# Patient Record
Sex: Female | Born: 1950
Health system: Southern US, Community
[De-identification: ages and names within clinical notes are randomized; demographics above are authoritative.]

## PROBLEM LIST (undated history)

## (undated) DIAGNOSIS — G243 Spasmodic torticollis: Secondary | ICD-10-CM

## (undated) DIAGNOSIS — R1310 Dysphagia, unspecified: Secondary | ICD-10-CM

## (undated) DIAGNOSIS — M7130 Other bursal cyst, unspecified site: Secondary | ICD-10-CM

## (undated) DIAGNOSIS — K5904 Chronic idiopathic constipation: Secondary | ICD-10-CM

## (undated) DIAGNOSIS — Z9889 Other specified postprocedural states: Secondary | ICD-10-CM

## (undated) DIAGNOSIS — G20 Parkinson's disease: Principal | ICD-10-CM

## (undated) DIAGNOSIS — R112 Nausea with vomiting, unspecified: Secondary | ICD-10-CM

## (undated) DIAGNOSIS — Z860100 Personal history of colon polyps, unspecified: Secondary | ICD-10-CM

## (undated) DIAGNOSIS — Z87898 Personal history of other specified conditions: Secondary | ICD-10-CM

## (undated) DIAGNOSIS — G4700 Insomnia, unspecified: Secondary | ICD-10-CM

## (undated) DIAGNOSIS — Z8669 Personal history of other diseases of the nervous system and sense organs: Secondary | ICD-10-CM

## (undated) DIAGNOSIS — G4752 REM sleep behavior disorder: Secondary | ICD-10-CM

## (undated) HISTORY — PX: FOOT SURGERY: SHX648

## (undated) HISTORY — DX: Personal history of other specified conditions: Z87.898

## (undated) HISTORY — DX: Personal history of colon polyps, unspecified: Z86.0100

## (undated) HISTORY — PX: TONSILLECTOMY: SUR1361

## (undated) HISTORY — DX: Chronic idiopathic constipation: K59.04

## (undated) HISTORY — DX: Other bursal cyst, unspecified site: M71.30

## (undated) HISTORY — DX: Personal history of other diseases of the nervous system and sense organs: Z86.69

## (undated) HISTORY — DX: Dysphagia, unspecified: R13.10

## (undated) HISTORY — PX: MOUTH SURGERY: SHX715

## (undated) HISTORY — DX: Spasmodic torticollis: G24.3

## (undated) HISTORY — PX: HAND TENDON SURGERY: SHX663

## (undated) HISTORY — DX: Insomnia, unspecified: G47.00

## (undated) HISTORY — DX: Parkinson's disease: G20

## (undated) HISTORY — DX: REM sleep behavior disorder: G47.52

---

## 1998-04-23 ENCOUNTER — Other Ambulatory Visit: Admission: RE | Admit: 1998-04-23 | Discharge: 1998-04-23 | Payer: Self-pay | Admitting: Gynecology

## 2000-06-10 ENCOUNTER — Other Ambulatory Visit: Admission: RE | Admit: 2000-06-10 | Discharge: 2000-06-10 | Payer: Self-pay | Admitting: Gynecology

## 2001-06-14 ENCOUNTER — Other Ambulatory Visit: Admission: RE | Admit: 2001-06-14 | Discharge: 2001-06-14 | Payer: Self-pay | Admitting: Gynecology

## 2002-06-29 ENCOUNTER — Other Ambulatory Visit: Admission: RE | Admit: 2002-06-29 | Discharge: 2002-06-29 | Payer: Self-pay | Admitting: Gynecology

## 2003-07-05 ENCOUNTER — Other Ambulatory Visit: Admission: RE | Admit: 2003-07-05 | Discharge: 2003-07-05 | Payer: Self-pay | Admitting: Gynecology

## 2004-07-08 ENCOUNTER — Other Ambulatory Visit: Admission: RE | Admit: 2004-07-08 | Discharge: 2004-07-08 | Payer: Self-pay | Admitting: Gynecology

## 2004-07-11 ENCOUNTER — Encounter: Admission: RE | Admit: 2004-07-11 | Discharge: 2004-07-11 | Payer: Self-pay | Admitting: Gynecology

## 2004-09-17 ENCOUNTER — Encounter: Admission: RE | Admit: 2004-09-17 | Discharge: 2004-09-17 | Payer: Self-pay | Admitting: Family Medicine

## 2004-09-18 ENCOUNTER — Ambulatory Visit: Payer: Self-pay | Admitting: Cardiology

## 2004-09-24 ENCOUNTER — Ambulatory Visit: Payer: Self-pay

## 2004-10-10 ENCOUNTER — Emergency Department (HOSPITAL_COMMUNITY): Admission: EM | Admit: 2004-10-10 | Discharge: 2004-10-10 | Payer: Self-pay | Admitting: Emergency Medicine

## 2005-01-19 ENCOUNTER — Encounter: Admission: RE | Admit: 2005-01-19 | Discharge: 2005-01-19 | Payer: Self-pay | Admitting: Neurology

## 2005-04-02 ENCOUNTER — Ambulatory Visit: Payer: Self-pay | Admitting: Internal Medicine

## 2005-04-14 ENCOUNTER — Ambulatory Visit: Payer: Self-pay | Admitting: Internal Medicine

## 2005-05-21 ENCOUNTER — Encounter: Admission: RE | Admit: 2005-05-21 | Discharge: 2005-05-21 | Payer: Self-pay | Admitting: General Surgery

## 2005-07-13 DIAGNOSIS — R569 Unspecified convulsions: Secondary | ICD-10-CM

## 2005-07-13 HISTORY — DX: Unspecified convulsions: R56.9

## 2005-07-20 ENCOUNTER — Other Ambulatory Visit: Admission: RE | Admit: 2005-07-20 | Discharge: 2005-07-20 | Payer: Self-pay | Admitting: Gynecology

## 2006-05-28 ENCOUNTER — Encounter: Admission: RE | Admit: 2006-05-28 | Discharge: 2006-05-28 | Payer: Self-pay | Admitting: Obstetrics and Gynecology

## 2006-07-22 ENCOUNTER — Other Ambulatory Visit: Admission: RE | Admit: 2006-07-22 | Discharge: 2006-07-22 | Payer: Self-pay | Admitting: Gynecology

## 2007-05-31 ENCOUNTER — Encounter: Admission: RE | Admit: 2007-05-31 | Discharge: 2007-05-31 | Payer: Self-pay | Admitting: Gynecology

## 2008-05-10 ENCOUNTER — Encounter (HOSPITAL_COMMUNITY): Payer: Self-pay | Admitting: Obstetrics and Gynecology

## 2008-05-10 ENCOUNTER — Ambulatory Visit (HOSPITAL_COMMUNITY): Admission: RE | Admit: 2008-05-10 | Discharge: 2008-05-10 | Payer: Self-pay | Admitting: Obstetrics and Gynecology

## 2008-09-25 ENCOUNTER — Encounter: Admission: RE | Admit: 2008-09-25 | Discharge: 2008-09-25 | Payer: Self-pay | Admitting: Obstetrics and Gynecology

## 2010-11-25 NOTE — Op Note (Signed)
NAME:  Chelsea, Conley NO.:  [id number]   MEDICAL RECORD NO.:  [id number]          PATIENT TYPE:  AMB   LOCATION:  SDC                           FACILITY:  [facility]   PHYSICIAN:  Zelphia Cairo, MD    DATE OF BIRTH:  Mar 18, 1951   DATE OF PROCEDURE:  05/10/2008  DATE OF DISCHARGE:                               OPERATIVE REPORT   PREOPERATIVE DIAGNOSIS:  Postmenopausal bleeding.   PROCEDURES:  1. Cervical block.  2. Hysteroscopy.  3. Dilation and curettage.   SURGEON:  Zelphia Cairo, MD   ESTIMATED BLOOD LOSS:  Minimal.   FLUID DEFICIT:  80 mL (sorbitol).   ANESTHESIA:  General.   SPECIMEN:  Endometrial curettings to pathology.   COMPLICATIONS:  None.   CONDITION:  Stable to recovery room.   PROCEDURE:  Chelsea Conley was taken to the recovery room where anesthesia was  found to be adequate.  She was placed in the dorsal lithotomy position  using Allen stirrups.  She was prepped and draped in sterile fashion and  an in-and-out catheter was used to drain her bladder for approximately  80 mL of clear urine.  Bivalve speculum was placed in the vagina and a  single-tooth tenaculum on the anterior lip of the cervix.  Yellow  plastic os finder was used to identify the cervical os.  The os was then  serially dilated using Pratt dilators.  Diagnostic hysteroscope was then  inserted into the uterine cavity and a survey was performed.  Bilateral  ostia appeared normal.  Endometrium appeared atrophic and pale small  polypoid mass was noted on the left anterior uterine wall.  Hysteroscope  was then removed.  A gentle curetting was then performed with a curette.  Specimen was placed on Telfa and passed off to be sent to pathology.  Hysteroscope was then reinserted.  Polypoid mass was noted to be  removed.  Hysteroscope, tenaculum, and speculum were then removed.  The  cervix was found to be hemostatic and the patient was taken to the  recovery room in stable  condition.     Zelphia Cairo, MD  Electronically Signed    GA/MEDQ  D:  05/10/2008  T:  05/11/2008  Job:  [id number]

## 2011-04-14 LAB — CBC
HCT: 42.3
Hemoglobin: 14.1
MCHC: 33.4
MCV: 91
Platelets: 255
RBC: 4.65
RDW: 12.7
WBC: 5

## 2011-04-14 LAB — TYPE AND SCREEN
ABO/RH(D): O POS
Antibody Screen: NEGATIVE

## 2011-04-14 LAB — ABO/RH: ABO/RH(D): O POS

## 2013-02-21 ENCOUNTER — Other Ambulatory Visit: Payer: Self-pay | Admitting: Gastroenterology

## 2013-02-21 DIAGNOSIS — R1011 Right upper quadrant pain: Secondary | ICD-10-CM

## 2013-02-27 ENCOUNTER — Ambulatory Visit (HOSPITAL_COMMUNITY)
Admission: RE | Admit: 2013-02-27 | Discharge: 2013-02-27 | Disposition: A | Discharge status: Home / Self Care | Payer: BC Managed Care – PPO | Source: Ambulatory Visit | Attending: Gastroenterology | Admitting: Gastroenterology

## 2013-02-27 ENCOUNTER — Encounter (HOSPITAL_COMMUNITY)
Admission: RE | Admit: 2013-02-27 | Discharge: 2013-02-27 | Disposition: A | Discharge status: Home / Self Care | Payer: BC Managed Care – PPO | Source: Ambulatory Visit | Attending: Gastroenterology | Admitting: Gastroenterology

## 2013-02-27 DIAGNOSIS — R1011 Right upper quadrant pain: Secondary | ICD-10-CM

## 2013-02-27 DIAGNOSIS — K7689 Other specified diseases of liver: Secondary | ICD-10-CM | POA: Insufficient documentation

## 2013-02-27 MED ORDER — SINCALIDE 5 MCG IJ SOLR
INTRAMUSCULAR | Status: AC
  Filled 2013-02-27: qty 5

## 2013-02-27 MED ORDER — SINCALIDE 5 MCG IJ SOLR
0.0200 ug/kg | Freq: Once | INTRAMUSCULAR | Status: AC
  Administered 2013-02-27: 1.42 ug via INTRAVENOUS

## 2013-02-28 ENCOUNTER — Other Ambulatory Visit: Payer: Self-pay | Admitting: Gastroenterology

## 2013-02-28 DIAGNOSIS — R1011 Right upper quadrant pain: Secondary | ICD-10-CM

## 2013-03-01 ENCOUNTER — Other Ambulatory Visit: Payer: Self-pay | Admitting: Gastroenterology

## 2013-03-01 DIAGNOSIS — R935 Abnormal findings on diagnostic imaging of other abdominal regions, including retroperitoneum: Secondary | ICD-10-CM

## 2013-03-02 ENCOUNTER — Other Ambulatory Visit: Payer: BC Managed Care – PPO

## 2013-03-09 ENCOUNTER — Ambulatory Visit
Admission: RE | Admit: 2013-03-09 | Discharge: 2013-03-09 | Disposition: A | Discharge status: Home / Self Care | Payer: BC Managed Care – PPO | Source: Ambulatory Visit | Attending: Gastroenterology | Admitting: Gastroenterology

## 2013-03-09 DIAGNOSIS — R935 Abnormal findings on diagnostic imaging of other abdominal regions, including retroperitoneum: Secondary | ICD-10-CM

## 2013-03-09 MED ORDER — GADOBENATE DIMEGLUMINE 529 MG/ML IV SOLN
14.0000 mL | Freq: Once | INTRAVENOUS | Status: AC | PRN
  Administered 2013-03-09: 14 mL via INTRAVENOUS

## 2013-08-01 ENCOUNTER — Telehealth: Payer: Self-pay | Admitting: *Deleted

## 2013-08-01 NOTE — Telephone Encounter (Signed)
Scheduled pt an appt 08-14-13

## 2013-08-01 NOTE — Telephone Encounter (Signed)
Pt states she has a question about an in office surgery, Dr Paulla Dolly had discussed.  I spoke with pt, informed that depending on the procedure, it may be able to be performed in the office.  I told her that she would need to be evaluated.  Pt agreed.  I referred to schedulers.

## 2013-08-14 ENCOUNTER — Ambulatory Visit (INDEPENDENT_AMBULATORY_CARE_PROVIDER_SITE_OTHER): Payer: BC Managed Care – PPO | Admitting: Podiatry

## 2013-08-14 ENCOUNTER — Encounter: Payer: Self-pay | Admitting: Podiatry

## 2013-08-14 VITALS — BP 135/80 | HR 91 | Resp 16

## 2013-08-14 DIAGNOSIS — M2040 Other hammer toe(s) (acquired), unspecified foot: Secondary | ICD-10-CM

## 2013-08-14 NOTE — Progress Notes (Signed)
Subjective:     Patient ID: Chelsea Conley, female   DOB: [birth year], 63 y.o.   MRN: [id number]  HPI patient presents stating I am ready to get this fifth toe fixed on my left foot. States that has been sore and she has trouble wearing shoe gear   Review of Systems     Objective:   Physical Exam Neurovascular status intact with significant keratotic lesion had a proximal phalanx fifth toe left that is painful when pressed and difficult for her to wear shoe gear with    Assessment:     Hammertoe deformity fifth toe left foot with keratotic lesion formation    Plan:     Reviewed x-rays and discussed condition. Discussed continued trimming versus fixing and she has opted to have it fixed I allowed her to read consent form for correction and explained to her all possible complications as outlined and she is willing to accept risk signing consent form after review. Scheduled for outpatient surgery at this time [facility] and explained total recovery can take up to 6 months

## 2013-08-14 NOTE — Patient Instructions (Signed)
Pre-Operative Instructions  Congratulations, you have decided to take an important step to improving your quality of life.  You can be assured that the doctors of [facility] will be with you every step of the way.  1. Plan to be at the surgery center/hospital at least 1 (one) hour prior to your scheduled time unless otherwise directed by the surgical center/hospital staff.  You must have a responsible adult accompany you, remain during the surgery and drive you home.  Make sure you have directions to the surgical center/hospital and know how to get there on time. 2. For hospital based surgery you will need to obtain a history and physical form from your family physician within 1 month prior to the date of surgery- we will give you a form for you primary physician.  3. We make every effort to accommodate the date you request for surgery.  There are however, times where surgery dates or times have to be moved.  We will contact you as soon as possible if a change in schedule is required.   4. No Aspirin/Ibuprofen for one week before surgery.  If you are on aspirin, any non-steroidal anti-inflammatory medications (Mobic, Aleve, Ibuprofen) you should stop taking it 7 days prior to your surgery.  You make take Tylenol  For pain prior to surgery.  5. Medications- If you are taking daily heart and blood pressure medications, seizure, reflux, allergy, asthma, anxiety, pain or diabetes medications, make sure the surgery center/hospital is aware before the day of surgery so they may notify you which medications to take or avoid the day of surgery. 6. No food or drink after midnight the night before surgery unless directed otherwise by surgical center/hospital staff. 7. No alcoholic beverages 24 hours prior to surgery.  No smoking 24 hours prior to or 24 hours after surgery. 8. Wear loose pants or shorts- loose enough to fit over bandages, boots, and casts. 9. No slip on shoes, sneakers are best. 10. Bring  your boot with you to the surgery center/hospital.  Also bring crutches or a walker if your physician has prescribed it for you.  If you do not have this equipment, it will be provided for you after surgery. 11. If you have not been contracted by the surgery center/hospital by the day before your surgery, call to confirm the date and time of your surgery. 12. Leave-time from work may vary depending on the type of surgery you have.  Appropriate arrangements should be made prior to surgery with your employer. 13. Prescriptions will be provided immediately following surgery by your doctor.  Have these filled as soon as possible after surgery and take the medication as directed. 14. Remove nail polish on the operative foot. 15. Wash the night before surgery.  The night before surgery wash the foot and leg well with the antibacterial soap provided and water paying special attention to beneath the toenails and in between the toes.  Rinse thoroughly with water and dry well with a towel.  Perform this wash unless told not to do so by your physician.  Enclosed: 1 Ice pack (please put in freezer the night before surgery)   1 Hibiclens skin cleaner   Pre-op Instructions  If you have any questions regarding the instructions, do not hesitate to call our office.  [place]: [address] [place], [place] [address] [phone]  [place]: [address] , [place] [address] [phone]  [place]: [address]  [place],  [address] [phone]  Dr. Richard   Tuchman DPM, Dr. Norman Regal DPM Dr. Richard Sikora DPM, Dr. M. Todd Hyatt DPM, Dr. Kathryn Egerton DPM 

## 2013-08-23 ENCOUNTER — Telehealth: Payer: Self-pay | Admitting: *Deleted

## 2013-08-23 NOTE — Telephone Encounter (Signed)
Pt states having surgery 08/29/2013, but the surgery center has not called with the time.  I informed the pt, [facility] will call about 24 - 48 hours before the surgery and give her the time.

## 2013-08-29 ENCOUNTER — Encounter: Payer: Self-pay | Admitting: Podiatry

## 2013-08-29 DIAGNOSIS — M2040 Other hammer toe(s) (acquired), unspecified foot: Secondary | ICD-10-CM

## 2013-09-04 ENCOUNTER — Ambulatory Visit (INDEPENDENT_AMBULATORY_CARE_PROVIDER_SITE_OTHER): Payer: BC Managed Care – PPO | Admitting: Podiatry

## 2013-09-04 ENCOUNTER — Ambulatory Visit (INDEPENDENT_AMBULATORY_CARE_PROVIDER_SITE_OTHER): Payer: BC Managed Care – PPO

## 2013-09-04 ENCOUNTER — Encounter: Payer: Self-pay | Admitting: Podiatry

## 2013-09-04 VITALS — BP 131/85 | HR 81 | Temp 97.3°F | Resp 18

## 2013-09-04 DIAGNOSIS — Z09 Encounter for follow-up examination after completed treatment for conditions other than malignant neoplasm: Secondary | ICD-10-CM

## 2013-09-04 NOTE — Progress Notes (Signed)
° °  Subjective:    Patient ID: Chelsea Conley, female    DOB: 1951-05-12, 63 y.o.   MRN: [id number]  HPI It has been good on my left foot and the surgery was 08-29-13 and not too much pain. She presents postop hammertoe repair fifth toe left foot on 08/29/2013, procedure perform a DR. Regal.    Review of Systems     Objective:   Physical Exam  Patient appears orientated x3 and is in no apparent pain.  The incisional site on the fifth toe is approximated with mild edema and some ecchymosis noted around the dorsal lateral aspect of the foot. There is no drainage from the incisional site. The toe appears rectus in appearance. There is no calf tenderness or edema on the left lower extremity.  X-ray taken today 02/23 2015, which did not populated into the reader list demonstrates resection of the proximal phalanx of the fifth left toe in a rectus position. There is a retained screw fixation noted in the left first metatarsal.  Radiographic impression: Evidence of surgical resection fifth toe left foot and satisfactory alignment.        Assessment & Plan:   Assessment: Satisfactory appearance of the operative site without any clinical sign of infection or DVT  Plan: Patient will be allowed to get wound site shower wet, and reapply light gauze dressing catching with Ace wrap. She will continue to ambulate in her surgical shoe.  Reappoint x2 weeks for suture removal.

## 2013-09-05 ENCOUNTER — Encounter: Payer: Self-pay | Admitting: Podiatry

## 2013-09-18 ENCOUNTER — Ambulatory Visit (INDEPENDENT_AMBULATORY_CARE_PROVIDER_SITE_OTHER): Payer: BC Managed Care – PPO | Admitting: Podiatry

## 2013-09-18 ENCOUNTER — Encounter: Payer: Self-pay | Admitting: Podiatry

## 2013-09-18 VITALS — BP 138/78 | HR 75 | Resp 16

## 2013-09-18 DIAGNOSIS — M2040 Other hammer toe(s) (acquired), unspecified foot: Secondary | ICD-10-CM

## 2013-09-18 NOTE — Progress Notes (Signed)
2.17.15 , post op suture removal . 5th toe left foot

## 2013-09-19 NOTE — Progress Notes (Signed)
Subjective:     Patient ID: Chelsea Conley, female   DOB: Dec 02, 1950, 63 y.o.   MRN: [id number]  HPI patient states my toe is healing fine and I am ready to get my stitches removed   Review of Systems     Objective:   Physical Exam Neurovascular status intact with fifth toe left doing well with mild edema stitches intact and wound edges well coapted with minimal discomfort    Assessment:     Healing well from digital surgery fifth left    Plan:     Stitches were removed today and dressing applied. Begin gradual shoe gear usage over the next several weeks

## 2013-09-19 NOTE — Progress Notes (Signed)
Surgery performed by Dr Paulla Dolly - Left Hammer Toe Repair with removal bone 5th toe

## 2013-10-25 ENCOUNTER — Telehealth: Payer: Self-pay | Admitting: *Deleted

## 2013-10-25 NOTE — Telephone Encounter (Signed)
I left a message on Monday.  Had foot surgery on 09/08/13.  I'm having different foot pain in the same area.  I want to speak to someone about that.  Please call.  I returned her call.  I just get pain on top of my foot and on the side on the area where I had the surgery.  Is this something I should be concerned about?  I asked her if she has any signs of infection.  She said no drainage, no excessive swelling or redness just the usual.  I informed her the pain is normal.  If it gets worse please call and schedule an appointment for follow-up.

## 2014-02-13 ENCOUNTER — Telehealth: Payer: Self-pay | Admitting: *Deleted

## 2014-02-13 NOTE — Telephone Encounter (Signed)
Called patient to see if she wanted an earlier appointment, patient stated that she would have to check with her job and call back to schedule appointment with NP MM.

## 2014-02-13 NOTE — Telephone Encounter (Signed)
Patient returned my phone call, appointment was scheduled for 02/16/14 at 2:30 pm with NP MM.

## 2014-02-15 ENCOUNTER — Encounter: Payer: Self-pay | Admitting: *Deleted

## 2014-02-16 ENCOUNTER — Encounter: Payer: Self-pay | Admitting: Adult Health

## 2014-02-16 ENCOUNTER — Ambulatory Visit (INDEPENDENT_AMBULATORY_CARE_PROVIDER_SITE_OTHER): Payer: Medicare Other | Admitting: Adult Health

## 2014-02-16 VITALS — BP 151/81 | HR 92 | Wt 155.0 lb

## 2014-02-16 DIAGNOSIS — R259 Unspecified abnormal involuntary movements: Secondary | ICD-10-CM

## 2014-02-16 DIAGNOSIS — R251 Tremor, unspecified: Secondary | ICD-10-CM

## 2014-02-16 MED ORDER — PRIMIDONE 50 MG PO TABS: 25.0000 mg | ORAL_TABLET | Freq: Every day | ORAL | Status: DC

## 2014-02-16 NOTE — Progress Notes (Signed)
PATIENT: Chelsea Conley DOB: 1951/05/25  REASON FOR VISIT: follow up HISTORY FROM: patient  HISTORY OF PRESENT ILLNESS: Chelsea Conley is a 63 year old female with a history of tremor. She returns today for followup. The patient has a tremor that was mainly effecting the left hand however at the last visit it was noted that she had a tremor in both hands. Patient reports that her tremor has gotten worse. She feels that it is more prominent in the left hand/arm. Patient denies any difficulty with ambulation but feels that she has to think more when she is walking. Denies any recent falls. No trouble with swallowing or choking. Denies trouble with standing or rolling over in bed. Patient has noticed more difficulty with eating certain foods due to the tremor. Patient states that when she was a child that she had a tremor in the left hand. She states that she never noticed but others made mention of it. Her pediatrician at the time told her she would grow out of it. Since her last visit patient has had no other medical issues.  REVIEW OF SYSTEMS: Full 14 system review of systems performed and notable only for:  Constitutional: N/A  Eyes: N/A Ear/Nose/Throat: N/A  Skin: N/A  Cardiovascular: N/A  Respiratory: N/A  Gastrointestinal: N/A  Genitourinary: N/A Hematology/Lymphatic: N/A  Endocrine: N/A Musculoskeletal:N/A  Allergy/Immunology: N/A  Neurological: tremors Psychiatric: N/A Sleep: N/A   ALLERGIES: No Known Allergies  HOME MEDICATIONS: Outpatient Prescriptions Prior to Visit  Medication Sig Dispense Refill  . Probiotic Product (PROBIOTIC DAILY PO) Take by mouth.      Marland Kitchen HYDROcodone-acetaminophen (NORCO/VICODIN) 5-325 MG per tablet Take 1 tablet by mouth every 6 (six) hours as needed for moderate pain.       No facility-administered medications prior to visit.    PAST MEDICAL HISTORY: Past Medical History  Diagnosis Date  . History of tremor   . History of seizures      PAST SURGICAL HISTORY: Past Surgical History  Procedure Laterality Date  . Foot surgery Bilateral   . Tonsillectomy    . Mouth surgery      X2    FAMILY HISTORY: Family History  Problem Relation Age of Onset  . Diabetes Father   . Diabetes Sister   . Diabetes Brother   . Parkinson's disease Father   . Parkinson's disease Maternal Grandfather   . Parkinson's disease Mother     SOCIAL HISTORY: History   Social History  . Marital Status: Married    Spouse Name: N/A    Number of Children: 4  . Years of Education: 12   Occupational History  .      [employer]   Social History Main Topics  . Smoking status: Never Smoker   . Smokeless tobacco: Never Used  . Alcohol Use: Yes  . Drug Use: No  . Sexual Activity: Not on file   Other Topics Concern  . Not on file   Social History Narrative   Patient is married with 4 children.   Patient is left handed.   Patient has a high school education.            PHYSICAL EXAM  Filed Vitals:   02/16/14 1418  BP: 151/81  Pulse: 92  Weight: 155 lb (70.308 kg)   There is no height on file to calculate BMI.   Generalized: Well developed, in no acute distress   Neurological examination  Mentation: Alert oriented to time, place, history taking.  Follows all commands speech and language fluent Cranial nerve II-XII: Pupils were equal round reactive to light. Extraocular movements were full, visual field were full on confrontational test. Facial sensation and strength were normal. . Uvula tongue midline. Head turning and shoulder shrug  were normal and symmetric. Motor: The motor testing reveals 5 over 5 strength of all 4 extremities. Good symmetric motor tone is noted throughout. Resting tremor tremor noted in upper extremities left>right. Sensory: Sensory testing is intact to soft touch on all 4 extremities. No evidence of extinction is noted.  Coordination: Cerebellar testing reveals good finger-nose-finger and  heel-to-shin bilaterally. Intention tremor noted Gait and station: Patient is able to stand from a sitting position without assistance. Gait is normal. Good arm swing however tremor noted in left arm while walking. Good turns.  Reflexes: Deep tendon reflexes are symmetric and normal bilaterally.   DIAGNOSTIC DATA (LABS, IMAGING, TESTING) - I reviewed patient records, labs, notes, testing and imaging myself where available.  Lab Results  Component Value Date   WBC 5.0 05/10/2008   HGB 14.1 05/10/2008   HCT 42.3 05/10/2008   MCV 91.0 05/10/2008   PLT 255 05/10/2008     ASSESSMENT AND PLAN 63 y.o. year old female  has a past medical history of History of tremor and History of seizures. here with:  1. Tremor- Parkinson vs. Essential   Patient tremor has increased since we saw her in 2013. She presents with symptoms that would reflect a benign essential tremor as well as some features of parkinson's. She also has a family history of parkinson's disease. According to the patient the tremor in the left hand has been present since she was a child. It has progressively gotten more noticeable as she has aged. I will start primidone 25 mg at bedtime. We can increase this dose if needed. Patient should follow-up in 6 months or sooner if needed.    Ward Givens, MSN, NP-C 02/16/2014, 2:44 PM [facility] [address], [place], [place] [address] [phone]  Note: This document was prepared with digital dictation and possible smart phrase technology. Any transcriptional errors that result from this process are unintentional.

## 2014-02-16 NOTE — Patient Instructions (Signed)
Primidone tablets What is this medicine? PRIMIDONE (PRI mi done) is a barbiturate. This medicine is used to control seizures in certain types of epilepsy. It is not for use in absence (petit mal) seizures. This medicine may be used for other purposes; ask your health care provider or pharmacist if you have questions. COMMON BRAND NAME(S): Mysoline What should I tell my health care provider before I take this medicine? They need to know if you have any of these conditions: -kidney disease -liver disease -porphyria -suicidal thoughts, plans, or attempt; a previous suicide attempt by you or a family member -an unusual or allergic reaction to primidone, phenobarbital, other barbiturates or seizure medications, other medicines, foods, dyes, or preservatives -pregnant or trying to get pregnant -breast-feeding How should I use this medicine? Take this medicine by mouth with a glass of water. Follow the directions on the prescription label. Take your doses at regular intervals. Do not take your medicine more often than directed. Do not stop taking except on the advice of your doctor or health care professional. A special MedGuide will be given to you by the pharmacist with each prescription and refill. Be sure to read this information carefully each time. Contact your pediatrician or health care professional regarding the use of this medication in children. Special care may be needed. While this drug may be prescribed for children for selected conditions, precautions do apply. Overdosage: If you think you have taken too much of this medicine contact a poison control center or emergency room at once. NOTE: This medicine is only for you. Do not share this medicine with others. What if I miss a dose? If you miss a dose, take it as soon as you can. If it is almost time for your next dose, take only that dose. Do not take double or extra doses. What may interact with this medicine? Do not take this  medicine with any of the following medications: -voriconazole This medicine may also interact with the following medications: -cancer-treating medications -cyclosporine -disopyramide -doxycycline -female hormones, including contraceptive or birth control pills -medicines for mental depression, anxiety or other mood problems -medicines for treating HIV infection or AIDS -modafinil -prescription pain medications -quinidine -warfarin This list may not describe all possible interactions. Give your health care provider a list of all the medicines, herbs, non-prescription drugs, or dietary supplements you use. Also tell them if you smoke, drink alcohol, or use illegal drugs. Some items may interact with your medicine. What should I watch for while using this medicine? Visit your doctor or health care professional for regular checks on your progress. It may be 2 to 3 weeks before you see the full effects of this medicine. Do not suddenly stop taking this medicine, you may increase the risk of seizures. Your doctor or health care professional may want to gradually reduce the dose. Wear a medical identification bracelet or chain to say you have epilepsy, and carry a card that lists all your medications. You may get drowsy or dizzy. Do not drive, use machinery, or do anything that needs mental alertness until you know how this medicine affects you. Do not stand or sit up quickly, especially if you are an older patient. This reduces the risk of dizzy or fainting spells. Alcohol may interfere with the effect of this medicine. Avoid alcoholic drinks. Birth control pills may not work properly while you are taking this medicine. Talk to your doctor about using an extra method of birth control. The use of this medicine  may increase the chance of suicidal thoughts or actions. Pay special attention to how you are responding while on this medicine. Any worsening of mood, or thoughts of suicide or dying should be  reported to your health care professional right away. Women who become pregnant while using this medicine may enroll in the [place] American Antiepileptic Drug Pregnancy Registry by calling 1-888-233-2334. This registry collects information about the safety of antiepileptic drug use during pregnancy. What side effects may I notice from receiving this medicine? Side effects that you should report to your doctor or health care professional as soon as possible: -allergic reactions like skin rash, itching or hives, swelling of the face, lips, or tongue -blurred, double vision, or uncontrollable rolling or movements of the eyes -redness, blistering, peeling or loosening of the skin, including inside the mouth -shortness of breath or difficulty breathing -unusual excitement or restlessness, more likely in children and the elderly -unusually weak or tired -worsening of mood, thoughts or actions of suicide or dying Side effects that usually do not require medical attention (report to your doctor or health care professional if they continue or are bothersome): -clumsiness, unsteadiness, or a hang-over effect -decreased sexual ability -dizziness, drowsiness -loss of appetite -nausea or vomiting This list may not describe all possible side effects. Call your doctor for medical advice about side effects. You may report side effects to FDA at 1-800-FDA-1088. Where should I keep my medicine? Keep out of the reach of children. Store at room temperature between 15 and 30 degrees C (59 and 86 degrees F). Throw away any unused medicine after the expiration date. NOTE: This sheet is a summary. It may not cover all possible information. If you have questions about this medicine, talk to your doctor, pharmacist, or health care provider.  2015, Elsevier/Gold Standard. (2010-02-25 18:04:48)  

## 2014-03-26 ENCOUNTER — Telehealth: Payer: Self-pay | Admitting: Adult Health

## 2014-03-26 MED ORDER — PRIMIDONE 50 MG PO TABS: 50.0000 mg | ORAL_TABLET | Freq: Every day | ORAL | Status: DC

## 2014-03-26 NOTE — Telephone Encounter (Signed)
I called the patient. She is currently taking primidone 50 mg one half a tablet at night. She states it is used to work very well but lately her tremor has gotten worse. She is requesting to take the whole tablet at night. I am amenable to this. She can begin taking primidone 50 mg one tablet at night.

## 2014-03-26 NOTE — Telephone Encounter (Signed)
Spoke with patient states that when she first started on the medication that it was working very well, but now not so well, states that she only takes 1/2 pill, should she increase to a whole pill?

## 2014-03-26 NOTE — Telephone Encounter (Signed)
Patient's calling to stated she's taking 1/2 dose of primidone (MYSOLINE) 50 MG tablet.  Patient stated it was helping at first really well for tremors, but not as effective as it was.  Questioning if she take whole pill instead?  Please call anytime and may leave detailed message on voicemail.

## 2014-05-01 ENCOUNTER — Ambulatory Visit: Payer: BC Managed Care – PPO | Admitting: Neurology

## 2014-05-30 ENCOUNTER — Encounter: Payer: Self-pay | Admitting: Neurology

## 2014-06-05 ENCOUNTER — Encounter: Payer: Self-pay | Admitting: Neurology

## 2014-06-22 ENCOUNTER — Other Ambulatory Visit: Payer: Self-pay

## 2014-06-22 MED ORDER — PRIMIDONE 50 MG PO TABS: 50.0000 mg | ORAL_TABLET | Freq: Every day | ORAL | Status: DC

## 2014-06-22 NOTE — Telephone Encounter (Signed)
Per phone note from 09/14

## 2014-08-24 ENCOUNTER — Ambulatory Visit (INDEPENDENT_AMBULATORY_CARE_PROVIDER_SITE_OTHER): Payer: BLUE CROSS/BLUE SHIELD | Admitting: Adult Health

## 2014-08-24 ENCOUNTER — Encounter: Payer: Self-pay | Admitting: Adult Health

## 2014-08-24 VITALS — BP 143/87 | HR 81 | Ht 66.0 in | Wt 158.0 lb

## 2014-08-24 DIAGNOSIS — R251 Tremor, unspecified: Secondary | ICD-10-CM | POA: Diagnosis not present

## 2014-08-24 MED ORDER — PROPRANOLOL HCL 10 MG PO TABS: 10.0000 mg | ORAL_TABLET | Freq: Two times a day (BID) | ORAL | Status: DC

## 2014-08-24 NOTE — Patient Instructions (Signed)
Stop primidone. Start propranolol 10 mg twice a day. If tolerating this dose after two weeks we will increase to 2 tablet BID. Please call our office in two weeks.   Propranolol tablets What is this medicine? PROPRANOLOL (proe PRAN oh lole) is a beta-blocker. Beta-blockers reduce the workload on the heart and help it to beat more regularly. This medicine is used to treat high blood pressure, to control irregular heart rhythms (arrhythmias) and to relieve chest pain caused by angina. It may also be helpful after a heart attack. This medicine is also used to prevent migraine headaches, relieve uncontrollable shaking (tremors), and help certain problems related to the thyroid gland and adrenal gland. This medicine may be used for other purposes; ask your health care provider or pharmacist if you have questions. COMMON BRAND NAME(S): Inderal What should I tell my health care provider before I take this medicine? They need to know if you have any of these conditions: -circulation problems or blood vessel disease -diabetes -history of heart attack or heart disease, vasospastic angina -kidney disease -liver disease -lung or breathing disease, like asthma or emphysema -pheochromocytoma -slow heart rate -thyroid disease -an unusual or allergic reaction to propranolol, other beta-blockers, medicines, foods, dyes, or preservatives -pregnant or trying to get pregnant -breast-feeding How should I use this medicine? Take this medicine by mouth with a glass of water. Follow the directions on the prescription label. Take your doses at regular intervals. Do not take your medicine more often than directed. Do not stop taking except on your the advice of your doctor or health care professional. Talk to your pediatrician regarding the use of this medicine in children. Special care may be needed. Overdosage: If you think you have taken too much of this medicine contact a poison control center or emergency room  at once. NOTE: This medicine is only for you. Do not share this medicine with others. What if I miss a dose? If you miss a dose, take it as soon as you can. If it is almost time for your next dose, take only that dose. Do not take double or extra doses. What may interact with this medicine? Do not take this medicine with any of the following medications: -feverfew -phenothiazines like chlorpromazine, mesoridazine, prochlorperazine, thioridazine This medicine may also interact with the following medications: -aluminum hydroxide gel -antipyrine -antiviral medicines for HIV or AIDS -barbiturates like phenobarbital -certain medicines for blood pressure, heart disease, irregular heart beat -cimetidine -ciprofloxacin -diazepam -fluconazole -haloperidol -isoniazid -medicines for cholesterol like cholestyramine or colestipol -medicines for mental depression -medicines for migraine headache like almotriptan, eletriptan, frovatriptan, naratriptan, rizatriptan, sumatriptan, zolmitriptan -NSAIDs, medicines for pain and inflammation, like ibuprofen or naproxen -phenytoin -rifampin -teniposide -theophylline -thyroid medicines -tolbutamide -warfarin -zileuton This list may not describe all possible interactions. Give your health care provider a list of all the medicines, herbs, non-prescription drugs, or dietary supplements you use. Also tell them if you smoke, drink alcohol, or use illegal drugs. Some items may interact with your medicine. What should I watch for while using this medicine? Visit your doctor or health care professional for regular check ups. Check your blood pressure and pulse rate regularly. Ask your health care professional what your blood pressure and pulse rate should be, and when you should contact them. You may get drowsy or dizzy. Do not drive, use machinery, or do anything that needs mental alertness until you know how this drug affects you. Do not stand or sit up  quickly, especially if you  are an older patient. This reduces the risk of dizzy or fainting spells. Alcohol can make you more drowsy and dizzy. Avoid alcoholic drinks. This medicine can affect blood sugar levels. If you have diabetes, check with your doctor or health care professional before you change your diet or the dose of your diabetic medicine. Do not treat yourself for coughs, colds, or pain while you are taking this medicine without asking your doctor or health care professional for advice. Some ingredients may increase your blood pressure. What side effects may I notice from receiving this medicine? Side effects that you should report to your doctor or health care professional as soon as possible: -allergic reactions like skin rash, itching or hives, swelling of the face, lips, or tongue -breathing problems -changes in blood sugar -cold hands or feet -difficulty sleeping, nightmares -dry peeling skin -hallucinations -muscle cramps or weakness -slow heart rate -swelling of the legs and ankles -vomiting Side effects that usually do not require medical attention (report to your doctor or health care professional if they continue or are bothersome): -change in sex drive or performance -diarrhea -dry sore eyes -hair loss -nausea -weak or tired This list may not describe all possible side effects. Call your doctor for medical advice about side effects. You may report side effects to FDA at 1-800-FDA-1088. Where should I keep my medicine? Keep out of the reach of children. Store at room temperature between 15 and 30 degrees C (59 and 86 degrees F). Protect from light. Throw away any unused medicine after the expiration date. NOTE: This sheet is a summary. It may not cover all possible information. If you have questions about this medicine, talk to your doctor, pharmacist, or health care provider.  2015, [employer]vier/[employer] Standard. (2013-03-03 14:51:53)

## 2014-08-24 NOTE — Progress Notes (Signed)
Chelsea Conley: Pricilla Holm Smalls DOB: 1951/01/29  REASON FOR VISIT: follow up- tremor HISTORY FROM: Chelsea Conley  HISTORY OF PRESENT ILLNESS: Chelsea Conley is a 64 year old female with a history of tremor. She returns today for followup. She is currently taking primidone she states that it worked well initially but it has worn off. She also reports that her balance has been affected since we increased the dose of primidone. She states that when she begins to ambulate her feet when shuffling but then she has good stride. Her tremor occurs sometimes at rest and with doing activities. It is greater in the left hand than right. Denies drooling. No trouble with swallowing. She states every once in a while something will get stuck in her throat. No trouble with standing and denies trouble rolling over in bed. No new medical issues since last seen.   HISTORY 03/26/14: Ms. Lenis is a 64 year old female with a history of tremor. She returns today for followup. The Chelsea Conley has a tremor that was mainly effecting the left hand however at the last visit it was noted that she had a tremor in both hands. Chelsea Conley reports that her tremor has gotten worse. She feels that it is more prominent in the left hand/arm. Chelsea Conley denies any difficulty with ambulation but feels that she has to think more when she is walking. Denies any recent falls. No trouble with swallowing or choking. Denies trouble with standing or rolling over in bed. Chelsea Conley has noticed more difficulty with eating certain foods due to the tremor. Chelsea Conley states that when she was a child that she had a tremor in the left hand. She states that she never noticed but others made mention of it. Her pediatrician at the time told her she would grow out of it. Since her last visit Chelsea Conley has had no other medical issues.  REVIEW OF SYSTEMS: Out of a complete 14 system review of symptoms, the Chelsea Conley complains only of the following symptoms, and all other reviewed systems  are negative.  tremors  ALLERGIES: No Known Allergies  HOME MEDICATIONS: Outpatient Prescriptions Prior to Visit  Medication Sig Dispense Refill  . primidone (MYSOLINE) 50 MG tablet Take 1 tablet (50 mg total) by mouth at bedtime. 30 tablet 3  . Probiotic Product (PROBIOTIC DAILY PO) Take by mouth.     No facility-administered medications prior to visit.    PAST MEDICAL HISTORY: Past Medical History  Diagnosis Date  . History of tremor   . History of seizures     PAST SURGICAL HISTORY: Past Surgical History  Procedure Laterality Date  . Foot surgery Bilateral   . Tonsillectomy    . Mouth surgery      X2    FAMILY HISTORY: Family History  Problem Relation Age of Onset  . Diabetes Father   . Diabetes Sister   . Diabetes Brother   . Parkinson's disease Father   . Parkinson's disease Maternal Grandfather   . Parkinson's disease Mother    PHYSICAL EXAM  Filed Vitals:   08/24/14 1041  BP: 143/87  Pulse: 81  Height: 5\' 6"  (1.676 m)  Weight: 158 lb (71.668 kg)   Body mass index is 25.51 kg/(m^2).  Generalized: Well developed, in no acute distress   Neurological examination  Mentation: Alert oriented to time, place, history taking. Follows all commands speech and language fluent. No masking of the face noted. Cranial nerve II-XII: Pupils were equal round reactive to light. Extraocular movements were full, visual field were full  on confrontational test. Facial sensation and strength were normal. Uvula tongue midline. Head turning and shoulder shrug  were normal and symmetric. Motor: The motor testing reveals 5 over 5 strength of all 4 extremities. Good symmetric motor tone is noted throughout. Resting tremor noted in left hand as well as intention tremor noted in left hand and right hand. Left >right. Sensory: Sensory testing is intact to soft touch on all 4 extremities. No evidence of extinction is noted.  Coordination: Cerebellar testing reveals good  finger-nose-finger and heel-to-shin bilaterally.  Gait and station:Chelsea Conley is able to stand from a sitting position with out assistance. Occasionally when she starts to ambulate or goes to make a change in direction she will have a shuffling gait but then quickly develops a good stride. She has decreased arm swing on the right. Tandem gait is normal. Romberg is negative. No drift is seen.  Reflexes: Deep tendon reflexes are symmetric and normal bilaterally.   DIAGNOSTIC DATA (LABS, IMAGING, TESTING) - I reviewed Chelsea Conley records, labs, notes, testing and imaging myself where available.   ASSESSMENT AND PLAN 63 y.o. year old female  has a past medical history of History of tremor and History of seizures. here with;  1. Tremor  Primidone was working well for the Chelsea Conley but she has developed changes in her gait that she states occurred after increasing the primidone. I will stop Primidone and start propranolol 10 mg BID. If tolerating we will increase to 20 mg BID. Chelsea Conley does have some overlying symptoms of parkinson's. For now we will continue to monitor. She will F/U in 3 months or sooner if needed.   Ward Givens, MSN, NP-C 08/24/2014, 10:49 AM [facility] [address], [place], [place] [address] [phone]  Note: This document was prepared with digital dictation and possible smart phrase technology. Any transcriptional errors that result from this process are unintentional.

## 2014-08-24 NOTE — Progress Notes (Signed)
I have read the note, and I agree with the clinical assessment and plan.  Kenzel Ruesch KEITH   

## 2014-09-07 ENCOUNTER — Telehealth: Payer: Self-pay | Admitting: Adult Health

## 2014-09-07 NOTE — Telephone Encounter (Signed)
Please advise the patient.

## 2014-09-07 NOTE — Telephone Encounter (Signed)
I called the patient. She states that since she started propranolol she feels that her tremors have gotten worse. She states the first couple nights she had a hard time sleeping because her left hand tremor was bad.  the patient is acutally on a very low dose of the propranolol. We should increase this and see if it offers any benefit. She will begin taken 20 mg twice a day. I have again went over the side effects of propranolol with the patient. She verbalized understanding.

## 2014-09-07 NOTE — Telephone Encounter (Signed)
Patient stated Rx propranolol (INDERAL) 10 MG tablet, wasn't beneficial for tremors in left hand.  She stated she experienced tremors in L hand, arm and also R hand.  Gait has also worsen.  Please call and advise.

## 2014-09-13 ENCOUNTER — Telehealth: Payer: Self-pay | Admitting: Adult Health

## 2014-09-13 MED ORDER — PROPRANOLOL HCL 10 MG PO TABS: 20.0000 mg | ORAL_TABLET | Freq: Two times a day (BID) | ORAL | Status: DC

## 2014-09-13 NOTE — Telephone Encounter (Signed)
Pt is calling stating she needs a new Rx written for propranolol (INDERAL) 10 MG tablet.  She states that it's working and Jinny Blossom was going to double the dose.  She uses [facility] in [place].  Please advise.

## 2014-09-13 NOTE — Telephone Encounter (Signed)
Per phone note on 02/26: She will begin taken 20 mg twice a day. I have again went over the side effects of propranolol with the patient. She verbalized understanding.  Rx has been sent.

## 2014-11-30 ENCOUNTER — Other Ambulatory Visit: Payer: Self-pay | Admitting: Obstetrics and Gynecology

## 2014-12-03 LAB — CYTOLOGY - PAP

## 2015-01-05 ENCOUNTER — Other Ambulatory Visit: Payer: Self-pay

## 2015-01-05 MED ORDER — PROPRANOLOL HCL 10 MG PO TABS: 20.0000 mg | ORAL_TABLET | Freq: Two times a day (BID) | ORAL | Status: DC

## 2015-01-08 ENCOUNTER — Ambulatory Visit (INDEPENDENT_AMBULATORY_CARE_PROVIDER_SITE_OTHER): Payer: Medicare Other | Admitting: Adult Health

## 2015-01-08 ENCOUNTER — Encounter: Payer: Self-pay | Admitting: Adult Health

## 2015-01-08 ENCOUNTER — Ambulatory Visit: Payer: BLUE CROSS/BLUE SHIELD | Admitting: Adult Health

## 2015-01-08 VITALS — BP 132/72 | HR 65 | Ht 65.0 in | Wt 153.0 lb

## 2015-01-08 DIAGNOSIS — R259 Unspecified abnormal involuntary movements: Secondary | ICD-10-CM

## 2015-01-08 DIAGNOSIS — R251 Tremor, unspecified: Secondary | ICD-10-CM

## 2015-01-08 MED ORDER — CARBIDOPA-LEVODOPA 25-100 MG PO TABS: ORAL_TABLET | ORAL | Status: DC

## 2015-01-08 NOTE — Patient Instructions (Signed)
Take half a pill twice daily, at approximately 8 AM and 1 PM for one week, then half a pill 3 times a day, at 8 AM, 1 PM and 6 PM for one week, then one pill 3 times a day thereafter.   Carbidopa; Levodopa tablets What is this medicine? CARBIDOPA;LEVODOPA (kar bi DOE pa; lee voe DOE pa) is used to treat the symptoms of Parkinson's disease. This medicine may be used for other purposes; ask your health care provider or pharmacist if you have questions. COMMON BRAND NAME(S): Atamet, SINEMET What should I tell my health care provider before I take this medicine? They need to know if you have any of these conditions: -asthma or lung disease -depression or other mental illness -diabetes -glaucoma -heart disease, including history of a heart attack -irregular heart beat -kidney or liver disease -melanoma or suspicious skin lesions -stomach or intestine ulcers -an unusual or allergic reaction to levodopa, carbidopa, other medicines, foods, dyes, or preservatives -pregnant or trying to get pregnant -breast-feeding How should I use this medicine? Take this medicine by mouth with a glass of water. Follow the directions on the prescription label. Take your doses at regular intervals. Do not take your medicine more often than directed. Do not stop taking except on the advice of your doctor or health care professional. Talk to your pediatrician regarding the use of this medicine in children. Special care may be needed. Overdosage: If you think you have taken too much of this medicine contact a poison control center or emergency room at once. NOTE: This medicine is only for you. Do not share this medicine with others. What if I miss a dose? If you miss a dose, take it as soon as you can. If it is almost time for your next dose, take only that dose. Do not take double or extra doses. What may interact with this medicine? Do not take this medicine with any of the following medications: -MAOIs like  Marplan, Nardil, and Parnate -reserpine -tetrabenazine This medicine may also interact with the following medications: -alcohol -droperidol -entacapone -iron supplements or multivitamins with iron -isoniazid, INH -linezolid -medicines for depression, anxiety, or psychotic disturbances -medicines for high blood pressure -medicines for sleep -metoclopramide -papaverine -procarbazine -tedizolid -rasagiline -selegiline -tolcapone This list may not describe all possible interactions. Give your health care provider a list of all the medicines, herbs, non-prescription drugs, or dietary supplements you use. Also tell them if you smoke, drink alcohol, or use illegal drugs. Some items may interact with your medicine. What should I watch for while using this medicine? Visit your doctor or health care professional for regular checks on your progress. It may be several weeks or months before you feel the full benefits of this medicine. Continue to take your medicine on a regular schedule. Do not take any additional medicines for Parkinson's disease without first consulting with your health care provider. You may experience a wearing of effect prior to the time for your next dose of this medicine. You may also experience an on-off effect where the medicine apparently stops working for anything from a minute to several hours, then suddenly starts working again. Tell your doctor or health care professional if any of these symptoms happen to you. Your dose may need to be changed. A high protein diet can slow or prevent absorption of this medicine. Avoid high protein foods near the time of taking this medicine to help to prevent these problems. Take this medicine at least 30 minutes before  eating or one hour after meals. You may want to eat higher protein foods later in the day or in small amounts. Discuss your diet with your doctor or health care professional or nutritionist. You may get drowsy or dizzy. Do  not drive, use machinery, or do anything that needs mental alertness until you know how this drug affects you. Do not stand or sit up quickly, especially if you are an older patient. This reduces the risk of dizzy or fainting spells. Alcohol can make you more drowsy and dizzy. Avoid alcoholic drinks. If you find that you have sudden feelings of wanting to sleep during normal activities, like cooking, watching television, or while driving or riding in a car, you should contact your health care professional. If you are diabetic, this medicine may interfere with the accuracy of some tests for sugar or ketones in the urine (does not interfere with blood tests). Check with your doctor or health care professional before changing the dose of your diabetic medicine. This medicine may discolor the urine or sweat, making it look darker or red in color. This is of no cause for concern. However, this may stain clothing or fabrics. There have been reports of increased sexual urges or other strong urges such as gambling while taking some medicines for Parkinson's disease. If you experience any of these urges while taking this medicine, you should report it to your health care provider as soon as possible. You should check your skin often for changes to moles and new growths while taking this medicine. Call your doctor if you notice any of these changes. What side effects may I notice from receiving this medicine? Side effects that you should report to your doctor or health care professional as soon as possible: -allergic reactions like skin rash, itching or hives, swelling of the face, lips, or tongue -anxiety, confusion, or nervousness -falling asleep during normal activities like driving -fast, irregular heartbeat -hallucination, loss of contact with reality -mood changes like aggressive behavior, depression -stomach pain -trouble passing urine -uncontrolled movements of the mouth, head, hands, feet, shoulders,  eyelids or other unusual muscle movements Side effects that usually do not require medical attention (report to your doctor or health care professional if they continue or are bothersome): -headache -loss of appetite -muscle twitches -nausea, vomiting -nightmares, trouble sleeping -unusually weak or tired This list may not describe all possible side effects. Call your doctor for medical advice about side effects. You may report side effects to FDA at 1-800-FDA-1088. Where should I keep my medicine? Keep out of the reach of children. Store at room temperature between 15 and 30 degrees C (59 and 86 degrees F). Protect from light. Throw away any unused medicine after the expiration date. NOTE: This sheet is a summary. It may not cover all possible information. If you have questions about this medicine, talk to your doctor, pharmacist, or health care provider.  2015, [employer]vier/Gold Standard. (2013-08-29 15:41:53)

## 2015-01-08 NOTE — Progress Notes (Addendum)
PATIENT: Chelsea Conley DOB: 10-Oct-1950  REASON FOR VISIT: follow up- tremor HISTORY FROM: patient  HISTORY OF PRESENT ILLNESS: Chelsea Conley  Is a 64 year old female with a history of tremor. She returns today for follow-up. She is currently taking propranolol 20 mg twice a day. She reports that her tremor had  Improved initially but she also has developed new symptoms. Her husband states that her gait has worsened. He states that when she first begins to walk she shuffles her feet. Patient reports that at times she feels like she is telling her feet to move but they don't. Husband has also noticed that she does not have good arm swing. She denies any trouble with swallowing. Denies any trouble with her speech. She reports that her tremor occurs at rest.  Left-sided greater than the right. She has a mother and maternal grandfather that had Parkinson's as well. She is able to complete all activities of daily living. She returns today for evaluation.   HISTORY 08/24/14: Chelsea Conley is a 64 year old female with a history of tremor. She returns today for followup. She is currently taking primidone she states that it worked well initially but it has worn off. She also reports that her balance has been affected since we increased the dose of primidone. She states that when she begins to ambulate her feet when shuffling but then she has good stride. Her tremor occurs sometimes at rest and with doing activities. It is greater in the left hand than right. Denies drooling. No trouble with swallowing. She states every once in a while something will get stuck in her throat. No trouble with standing and denies trouble rolling over in bed. No new medical issues since last seen.   HISTORY 03/26/14: Chelsea Conley is a 64 year old female with a history of tremor. She returns today for followup. The patient has a tremor that was mainly effecting the left hand however at the last visit it was noted that she had a  tremor in both hands. Patient reports that her tremor has gotten worse. She feels that it is more prominent in the left hand/arm. Patient denies any difficulty with ambulation but feels that she has to think more when she is walking. Denies any recent falls. No trouble with swallowing or choking. Denies trouble with standing or rolling over in bed. Patient has noticed more difficulty with eating certain foods due to the tremor. Patient states that when she was a child that she had a tremor in the left hand. She states that she never noticed but others made mention of it. Her pediatrician at the time told her she would grow out of it. Since her last visit patient has had no other medical issues.   REVIEW OF SYSTEMS: Out of a complete 14 system review of symptoms, the patient complains only of the following symptoms, and all other reviewed systems are negative.  Abdominal pain, weakness, tremors, walking difficulty,   ALLERGIES: No Known Allergies  HOME MEDICATIONS: Outpatient Prescriptions Prior to Visit  Medication Sig Dispense Refill  . Probiotic Product (PROBIOTIC DAILY PO) Take by mouth.    . propranolol (INDERAL) 10 MG tablet Take 2 tablets (20 mg total) by mouth 2 (two) times daily. 120 tablet 0   No facility-administered medications prior to visit.    PAST MEDICAL HISTORY: Past Medical History  Diagnosis Date  . History of tremor   . History of seizures     PAST SURGICAL HISTORY: Past Surgical History  Procedure Laterality Date  . Foot surgery Bilateral   . Tonsillectomy    . Mouth surgery      X2    FAMILY HISTORY: Family History  Problem Relation Age of Onset  . Diabetes Father   . Diabetes Sister   . Diabetes Brother   . Parkinson's disease Father   . Parkinson's disease Maternal Grandfather   . Parkinson's disease Mother     SOCIAL HISTORY: History   Social History  . Marital Status: Married    Spouse Name: N/A  . Number of Children: 4  . Years of  Education: 12   Occupational History  .      [employer]   Social History Main Topics  . Smoking status: Never Smoker   . Smokeless tobacco: Never Used  . Alcohol Use: Yes  . Drug Use: No  . Sexual Activity: Not on file   Other Topics Concern  . Not on file   Social History Narrative   Patient is married with 4 children.   Patient is left handed.   Patient has a high school education.            PHYSICAL EXAM  Filed Vitals:   01/08/15 1518  BP: 132/72  Pulse: 65  Height: 5\' 5"  (1.651 m)  Weight: 153 lb (69.4 kg)   Body mass index is 25.46 kg/(m^2).  Generalized: Well developed, in no acute distress   Neurological examination  Mentation: Alert oriented to time, place, history taking. Follows all commands speech and language fluent.  Mild masking of the face noted. Cranial nerve II-XII: Pupils were equal round reactive to light. Extraocular movements were full, visual field were full on confrontational test. Facial sensation and strength were normal. Uvula tongue midline. Head turning and shoulder shrug  were normal and symmetric. Motor: The motor testing reveals 5 over 5 strength of all 4 extremities. Good symmetric motor tone is noted throughout. Mild rigidity noted in the upper extremities. Sensory: Sensory testing is intact to soft touch on all 4 extremities. No evidence of extinction is noted.  Coordination: Cerebellar testing reveals good finger-nose-finger and heel-to-shin bilaterally.  Mild intention tremor noted Gait and station:  Patient is able to stand from a sitting position without assistance. Patient has a shuffling gait when she initially starts to walk but then develops a good stride. She has decreased arm swing with a tremor on the left.  She takes multiple steps when turning. Patient had one mild episode of freezing while ambulating. Reflexes: Deep tendon reflexes are symmetric and normal bilaterally.    DIAGNOSTIC DATA (LABS, IMAGING,  TESTING) - I reviewed patient records, labs, notes, testing and imaging myself where available.  Lab Results  Component Value Date   WBC 5.0 05/10/2008   HGB 14.1 05/10/2008   HCT 42.3 05/10/2008   MCV 91.0 05/10/2008   PLT 255 05/10/2008      ASSESSMENT AND PLAN 64 y.o. year old female  has a past medical history of History of tremor and History of seizures. here with:  1.  Tremor 2. Parkinson's features  Patient has developed some features of parkinson's- she now has a shuffling gait with freezing episodes. She also has decreased arm swing and a resting tremor. I consulted with Dr. Rexene Alberts. At this time I will start the patient on a Sinemet titration working up to 1 tablet 25-100 mg TID. I have reviewed side effects with the patient. She verbalized understanding. She will continue to take propranolol for  now. This may be discontinued in the future if she has good benefit with Sinemet. If her symptoms worsen or she develops new symptoms she should let us know. She will F/U in 3 months with Dr. Jannifer Franklin.    Ward Givens, MSN, NP-C 01/08/2015, 3:16 PM [facility] [address], [place], [place] [address] [phone]  Note: This document was prepared with digital dictation and possible smart phrase technology. Any transcriptional errors that result from this process are unintentional.  I reviewed the above note and documentation by the Nurse Practitioner and agree with the history, physical exam, assessment and plan as outlined above. I was immediately available for face-to-face consultation. Star Age, MD, PhD [facility])

## 2015-01-18 ENCOUNTER — Ambulatory Visit: Payer: BLUE CROSS/BLUE SHIELD | Admitting: Adult Health

## 2015-01-28 ENCOUNTER — Ambulatory Visit: Payer: BLUE CROSS/BLUE SHIELD | Admitting: Adult Health

## 2015-02-04 ENCOUNTER — Other Ambulatory Visit: Payer: Self-pay

## 2015-02-04 MED ORDER — PROPRANOLOL HCL 10 MG PO TABS: 20.0000 mg | ORAL_TABLET | Freq: Two times a day (BID) | ORAL | Status: DC

## 2015-04-30 ENCOUNTER — Telehealth: Payer: Self-pay | Admitting: Adult Health

## 2015-04-30 MED ORDER — PROPRANOLOL HCL 10 MG PO TABS: 20.0000 mg | ORAL_TABLET | Freq: Two times a day (BID) | ORAL | Status: DC

## 2015-04-30 MED ORDER — CARBIDOPA-LEVODOPA 25-100 MG PO TABS: 1.0000 | ORAL_TABLET | Freq: Three times a day (TID) | ORAL | Status: DC

## 2015-04-30 NOTE — Telephone Encounter (Signed)
Rx's have been sent.  Receipt confirmed by pharmacy.  I called the patient back to advise.  Got no answer.  Left message.

## 2015-04-30 NOTE — Telephone Encounter (Signed)
Pt needs refill on carbidopa-levodopa (SINEMET IR) 25-100 MG per tablet and propranolol (INDERAL) 10 MG tablet. Pt is scheduled to come in for appt Nov. Pt needs auth. Thank you Pt is requesting a call when it is done [phone]

## 2015-05-01 ENCOUNTER — Encounter: Payer: Self-pay | Admitting: Gastroenterology

## 2015-05-15 ENCOUNTER — Ambulatory Visit (INDEPENDENT_AMBULATORY_CARE_PROVIDER_SITE_OTHER): Payer: Medicare Other | Admitting: Neurology

## 2015-05-15 ENCOUNTER — Encounter: Payer: Self-pay | Admitting: Neurology

## 2015-05-15 VITALS — BP 114/72 | HR 64 | Resp 16 | Ht 64.0 in | Wt 159.5 lb

## 2015-05-15 DIAGNOSIS — R251 Tremor, unspecified: Secondary | ICD-10-CM | POA: Insufficient documentation

## 2015-05-15 DIAGNOSIS — R569 Unspecified convulsions: Secondary | ICD-10-CM | POA: Insufficient documentation

## 2015-05-15 DIAGNOSIS — G20 Parkinson's disease: Secondary | ICD-10-CM

## 2015-05-15 DIAGNOSIS — E785 Hyperlipidemia, unspecified: Secondary | ICD-10-CM | POA: Insufficient documentation

## 2015-05-15 DIAGNOSIS — G20A1 Parkinson's disease without dyskinesia, without mention of fluctuations: Secondary | ICD-10-CM

## 2015-05-15 HISTORY — DX: Tremor, unspecified: R25.1

## 2015-05-15 HISTORY — DX: Parkinson's disease without dyskinesia, without mention of fluctuations: G20.A1

## 2015-05-15 HISTORY — DX: Parkinson's disease: G20

## 2015-05-15 HISTORY — DX: Hyperlipidemia, unspecified: E78.5

## 2015-05-15 MED ORDER — RASAGILINE MESYLATE 0.5 MG PO TABS: ORAL_TABLET | ORAL | Status: DC

## 2015-05-15 MED ORDER — CARBIDOPA-LEVODOPA 25-100 MG PO TABS: 1.0000 | ORAL_TABLET | Freq: Three times a day (TID) | ORAL | Status: DC

## 2015-05-15 MED ORDER — PROPRANOLOL HCL 20 MG PO TABS: 20.0000 mg | ORAL_TABLET | Freq: Two times a day (BID) | ORAL | Status: DC

## 2015-05-15 NOTE — Patient Instructions (Addendum)
    We will add Azilect to the drug regimen.   Parkinson Disease Parkinson disease is a disorder of the central nervous system, which includes the brain and spinal cord. A person with this disease slowly loses the ability to completely control body movements. Within the brain, there is a group of nerve cells (basal ganglia) that help control movement. The basal ganglia are damaged and do not work properly in a person with Parkinson disease. In addition, the basal ganglia produce and use a brain chemical called dopamine. The dopamine chemical sends messages to other parts of the body to control and coordinate body movements. Dopamine levels are low in a person with Parkinson disease. If the dopamine levels are low, then the body does not receive the correct messages it needs to move normally.  CAUSES  The exact reason why the basal ganglia get damaged is not known. Some medical researchers have thought that infection, genes, environment, and certain medicines may contribute to the cause.  SYMPTOMS   An early symptom of Parkinson disease is often an uncontrolled shaking (tremor) of the hands. The tremor will often disappear when the affected hand is consciously used.  As the disease progresses, walking, talking, getting out of a chair, and new movements become more difficult.  Muscles get stiff and movements become slower.  Balance and coordination become harder.  Depression, trouble swallowing, urinary problems, constipation, and sleep problems can occur.  Later in the disease, memory and thought processes may deteriorate. DIAGNOSIS  There are no specific tests to diagnose Parkinson disease. You may be referred to a neurologist for evaluation. Your caregiver will ask about your medical history, symptoms, and perform a physical exam. Blood tests and imaging tests of your brain may be performed to rule out other diseases. The imaging tests may include an MRI or a CT scan. TREATMENT  The goal of  treatment is to relieve symptoms. Medicines may be prescribed once the symptoms become troublesome. Medicine will not stop the progression of the disease, but medicine can make movement and balance better and help control tremors. Speech and occupational therapy may also be prescribed. Sometimes, surgical treatment of the brain can be done in young people. HOME CARE INSTRUCTIONS  Get regular exercise and rest periods during the day to help prevent exhaustion and depression.  If getting dressed becomes difficult, replace buttons and zippers with Velcro and elastic on your clothing.  Take all medicine as directed by your caregiver.  Install grab bars or railings in your home to prevent falls.  Go to speech or occupational therapy as directed.  Keep all follow-up visits as directed by your caregiver. SEEK MEDICAL CARE IF:  Your symptoms are not controlled with your medicine.  You fall.  You have trouble swallowing or choke on your food. MAKE SURE YOU:  Understand these instructions.  Will watch your condition.  Will get help right away if you are not doing well or get worse.   This information is not intended to replace advice given to you by your health care provider. Make sure you discuss any questions you have with your health care provider.   Document Released: 06/26/2000 Document Revised: 10/24/2012 Document Reviewed: 07/29/2011 Elsevier Interactive Patient Education [employer].

## 2015-05-15 NOTE — Progress Notes (Signed)
Reason for visit:  Parkinson's disease  Chelsea Conley is an 64 y.o. female  History of present illness:   Chelsea Conley is a 64 year old left-handed white female with a history of tremors, the patient over time has gradually developed Parkinson's disease. She mainly has left upper extremity tremors, she developed some difficulty with freezing with walking, and masking of the face. The patient has been placed on Sinemet, she is currently on the 25/100 mg tablets taking 1 tablet 3 times daily. The patient is doing well with this medication. She is sleeping about 6 hours which seemed to be adequate for her. The patient is no longer having significant issues with handwriting or with walking. She has not had any falls, she reports no difficulty with swallowing. She is tolerating the medication well. She is on propranolol for the tremors, which she believes has been helpful. She returns for an evaluation.  Past Medical History  Diagnosis Date  . History of tremor   . History of seizures   . Parkinson disease ([place]) 05/15/2015    Past Surgical History  Procedure Laterality Date  . Foot surgery Bilateral   . Tonsillectomy    . Mouth surgery      X2    Family History  Problem Relation Age of Onset  . Diabetes Father   . Diabetes Sister   . Diabetes Brother   . Parkinson's disease Maternal Grandfather   . Parkinson's disease Mother     Social history:  reports that she has never smoked. She has never used smokeless tobacco. She reports that she drinks alcohol. She reports that she does not use illicit drugs.   No Known Allergies  Medications:  Prior to Admission medications   Medication Sig Start Date End Date Taking? Authorizing Provider  carbidopa-levodopa (SINEMET IR) 25-100 MG tablet Take 1 tablet by mouth 3 (three) times daily. 05/15/15  Yes Kathrynn Ducking, MD  Probiotic Product (PROBIOTIC DAILY PO) Take by mouth.    Historical Provider, MD  propranolol (INDERAL) 20 MG  tablet Take 1 tablet (20 mg total) by mouth 2 (two) times daily. 05/15/15   Kathrynn Ducking, MD  rasagiline (AZILECT) 0.5 MG TABS tablet One tablet daily for 4 weeks, then take 2 tablets daily 05/15/15   Kathrynn Ducking, MD    ROS:  Out of a complete 14 system review of symptoms, the patient complains only of the following symptoms, and all other reviewed systems are negative.   Tremor   Blood pressure 114/72, pulse 64, resp. rate 16, height 5\' 4"  (1.626 m), weight 159 lb 8 oz (72.349 kg).  Physical Exam  General: The patient is alert and cooperative at the time of the examination.  Skin: No significant peripheral edema is noted.   Neurologic Exam  Mental status: The patient is alert and oriented x 3 at the time of the examination. The patient has apparent normal recent and remote memory, with an apparently normal attention span and concentration ability.   Cranial nerves: Facial symmetry is present. Speech is normal, no aphasia or dysarthria is noted. Extraocular movements are full. Visual fields are full. Mild masking the face is seen.  Motor: The patient has good strength in all 4 extremities.  Sensory examination: Soft touch sensation is symmetric on the face, arms, and legs.  Coordination: The patient has good finger-nose-finger and heel-to-shin bilaterally. A resting tremors noted on the left upper extremity.  Gait and station: The patient has a normal gait.  The patient is able to rise from a seated position with arms crossed. With walking, the patient does have some slightly decreased arm swing bilaterally. Tandem gait is normal. Romberg is negative. No drift is seen.  Reflexes: Deep tendon reflexes are symmetric.   Assessment/Plan:   1. Parkinson's disease   The patient will continue the Sinemet, I will add Azilect to the regimen, and continue the propranolol. The patient believes that the propranolol does help with the tremor. The patient will be seen in about 5  months. The patient will contact me if she has any new issues. She is remaining active, exercising on a regular basis, I have encouraged her to continue this.  Jill Alexanders MD 05/15/2015 6:38 PM  [facility] [address] [place], [place] [address]  Phone [phone] Fax [phone]

## 2015-07-18 ENCOUNTER — Other Ambulatory Visit: Payer: Self-pay

## 2015-07-18 MED ORDER — RASAGILINE MESYLATE 0.5 MG PO TABS: ORAL_TABLET | ORAL | Status: DC

## 2015-07-29 ENCOUNTER — Telehealth: Payer: Self-pay | Admitting: Neurology

## 2015-07-29 MED ORDER — RASAGILINE MESYLATE 1 MG PO TABS: 1.0000 mg | ORAL_TABLET | Freq: Every day | ORAL | Status: DC

## 2015-07-29 NOTE — Telephone Encounter (Signed)
I called back and spoke to the patient.  She has now titrated up to 1mg  daily.  Her ins will now only cover 30 tabs per month, so instead of taking two 0.5mg  tabs, she would like to try one 1mg  tab.  The total dose will remain the same, and per patient, ins states they will cover this.

## 2015-07-29 NOTE — Telephone Encounter (Signed)
Pt called said she rec'd letter from [place] said rasagiline (AZILECT) 0.5 MG TABS tablet has a quantity limit of 30/mth. She is taking 1 tab am and 1 tab pm. Letter sts could provider make a change to script or is there a generic.

## 2015-10-31 ENCOUNTER — Ambulatory Visit (INDEPENDENT_AMBULATORY_CARE_PROVIDER_SITE_OTHER): Payer: PPO | Admitting: Neurology

## 2015-10-31 ENCOUNTER — Encounter: Payer: Self-pay | Admitting: Neurology

## 2015-10-31 VITALS — BP 117/71 | HR 72 | Ht 64.0 in | Wt 158.5 lb

## 2015-10-31 DIAGNOSIS — G20 Parkinson's disease: Secondary | ICD-10-CM | POA: Diagnosis not present

## 2015-10-31 DIAGNOSIS — R569 Unspecified convulsions: Secondary | ICD-10-CM | POA: Diagnosis not present

## 2015-10-31 MED ORDER — CARBIDOPA-LEVODOPA 25-100 MG PO TABS: 1.0000 | ORAL_TABLET | Freq: Three times a day (TID) | ORAL | Status: DC

## 2015-10-31 MED ORDER — PROPRANOLOL HCL 20 MG PO TABS: 20.0000 mg | ORAL_TABLET | Freq: Two times a day (BID) | ORAL | Status: DC

## 2015-10-31 MED ORDER — RASAGILINE MESYLATE 1 MG PO TABS: 1.0000 mg | ORAL_TABLET | Freq: Every day | ORAL | Status: DC

## 2015-10-31 NOTE — Progress Notes (Signed)
Reason for visit: Parkinson's disease  Chelsea Conley is an 65 y.o. female  History of present illness:  Chelsea Conley is a 65 year old left-handed white female with a history of Parkinson's disease. The patient has been doing relatively well since last seen, she is on Azilect, propranolol, and Sinemet. She does report some fatigue lasting about half an hour to an hour in the midafternoon after she eats. At other times, she feels well. She reports no significant difficulty with balance or gait disturbance issues, she gets in and out of a chair well, able to climb stairs. The patient has some tremors, primarily in the left upper extremity, she still believes that the propranolol helps. No other new issues have come up since last seen.  Past Medical History  Diagnosis Date  . History of tremor   . History of seizures   . Parkinson disease ([place]) 05/15/2015    Past Surgical History  Procedure Laterality Date  . Foot surgery Bilateral   . Tonsillectomy    . Mouth surgery      X2    Family History  Problem Relation Age of Onset  . Diabetes Father   . Diabetes Sister   . Diabetes Brother   . Parkinson's disease Maternal Grandfather   . Parkinson's disease Mother     Social history:  reports that she has never smoked. She has never used smokeless tobacco. She reports that she drinks alcohol. She reports that she does not use illicit drugs.   No Known Allergies  Medications:  Prior to Admission medications   Medication Sig Start Date End Date Taking? Authorizing Provider  carbidopa-levodopa (SINEMET IR) 25-100 MG tablet Take 1 tablet by mouth 3 (three) times daily. 10/31/15  Yes Kathrynn Ducking, MD  propranolol (INDERAL) 20 MG tablet Take 1 tablet (20 mg total) by mouth 2 (two) times daily. 10/31/15  Yes Kathrynn Ducking, MD  rasagiline (AZILECT) 1 MG TABS tablet Take 1 tablet (1 mg total) by mouth daily. 10/31/15  Yes Kathrynn Ducking, MD    ROS:  Out of a complete 14  system review of symptoms, the patient complains only of the following symptoms, and all other reviewed systems are negative.  Daytime sleepiness Tremors  Blood pressure 117/71, pulse 72, height 5\' 4"  (1.626 m), weight 158 lb 8 oz (71.895 kg).  Physical Exam  General: The patient is alert and cooperative at the time of the examination.  Skin: No significant peripheral edema is noted.   Neurologic Exam  Mental status: The patient is alert and oriented x 3 at the time of the examination. The patient has apparent normal recent and remote memory, with an apparently normal attention span and concentration ability.   Cranial nerves: Facial symmetry is present. Speech is normal, no aphasia or dysarthria is noted. Extraocular movements are full. Visual fields are full. Minimal masking of the face is seen.  Motor: The patient has good strength in all 4 extremities.  Sensory examination: Soft touch sensation is symmetric on the face, arms, and legs.  Coordination: The patient has good finger-nose-finger and heel-to-shin bilaterally. A mild resting tremors noted on the left upper extremity.  Gait and station: The patient has a normal gait. The patient appears to have good arm swing bilaterally, some tremors noted in the left hand while walking. Tandem gait is normal. Romberg is negative. No drift is seen.  Reflexes: Deep tendon reflexes are symmetric.   Assessment/Plan:  1. Parkinson's disease  The  patient is doing well currently, she is having some mild drowsiness in the midafternoon that is transient. We will not make any dose adjustments today, she will follow-up in 6 months. Prescriptions were sent in for the Azilect, Sinemet, and propranolol.  Jill Alexanders MD 10/31/2015 3:57 PM  [facility] [address] [place], [place] [address]  Phone [phone] Fax [phone]

## 2015-12-17 ENCOUNTER — Telehealth: Payer: Self-pay | Admitting: Neurology

## 2015-12-17 MED ORDER — SELEGILINE HCL 5 MG PO TABS: ORAL_TABLET | ORAL | Status: DC

## 2015-12-17 NOTE — Telephone Encounter (Signed)
Pt called in about her rasagiline (AZILECT) 1 MG TABS tablet ( generic). She has [employer] and will have to pay $1,000 for the medication. She wants to know if there is something else she can try. Please call [phone]

## 2015-12-17 NOTE — Telephone Encounter (Signed)
I called the patient. The Azilect is too expensive, I'll switch to selegiline. I will call in a prescription.

## 2016-04-27 DIAGNOSIS — Z13220 Encounter for screening for lipoid disorders: Secondary | ICD-10-CM | POA: Diagnosis not present

## 2016-04-27 DIAGNOSIS — Z1211 Encounter for screening for malignant neoplasm of colon: Secondary | ICD-10-CM | POA: Diagnosis not present

## 2016-04-27 DIAGNOSIS — Z23 Encounter for immunization: Secondary | ICD-10-CM | POA: Diagnosis not present

## 2016-04-27 DIAGNOSIS — G20 Parkinson's disease: Secondary | ICD-10-CM | POA: Diagnosis not present

## 2016-04-27 DIAGNOSIS — Z0000 Encounter for general adult medical examination without abnormal findings: Secondary | ICD-10-CM | POA: Diagnosis not present

## 2016-04-27 DIAGNOSIS — Z130 Encounter for screening for diseases of the blood and blood-forming organs and certain disorders involving the immune mechanism: Secondary | ICD-10-CM | POA: Diagnosis not present

## 2016-05-04 ENCOUNTER — Encounter: Payer: Self-pay | Admitting: Neurology

## 2016-05-04 ENCOUNTER — Ambulatory Visit (INDEPENDENT_AMBULATORY_CARE_PROVIDER_SITE_OTHER): Payer: PPO | Admitting: Neurology

## 2016-05-04 VITALS — BP 115/73 | HR 69 | Ht 64.0 in | Wt 153.5 lb

## 2016-05-04 DIAGNOSIS — G20 Parkinson's disease: Secondary | ICD-10-CM | POA: Diagnosis not present

## 2016-05-04 MED ORDER — SELEGILINE HCL 5 MG PO TABS: ORAL_TABLET | ORAL | 5 refills | Status: DC

## 2016-05-04 MED ORDER — CARBIDOPA-LEVODOPA 25-100 MG PO TABS: ORAL_TABLET | ORAL | 5 refills | Status: DC

## 2016-05-04 MED ORDER — PROPRANOLOL HCL 20 MG PO TABS: 20.0000 mg | ORAL_TABLET | Freq: Two times a day (BID) | ORAL | 5 refills | Status: DC

## 2016-05-04 NOTE — Progress Notes (Signed)
Reason for visit: Parkinson's disease  Chelsea Conley is an 65 y.o. female  History of present illness:  Chelsea Conley is a 65 year old left-handed white female with a history of Parkinson's disease. The patient has been on Azilect, she had to be switched to selegiline secondary to the cost. On selegiline she has had some problems with nausea on and off. She is also having some dizziness in the midafternoon with her medications. The patient has a tremor that seems to be benefited with propranolol, she continues this medication. She denies any falls, she is trying to stay active. She denies problems with swallowing or choking. She has not had much progression of her clinical symptoms. She returns for an evaluation.  Past Medical History:  Diagnosis Date  . History of seizures   . History of tremor   . Parkinson disease ([place]) 05/15/2015    Past Surgical History:  Procedure Laterality Date  . FOOT SURGERY Bilateral   . MOUTH SURGERY     X2  . TONSILLECTOMY      Family History  Problem Relation Age of Onset  . Diabetes Father   . Diabetes Sister   . Diabetes Brother   . Parkinson's disease Mother   . Parkinson's disease Maternal Grandfather     Social history:  reports that she has never smoked. She has never used smokeless tobacco. She reports that she drinks alcohol. She reports that she does not use drugs.   No Known Allergies  Medications:  Prior to Admission medications   Medication Sig Start Date End Date Taking? Authorizing Provider  carbidopa-levodopa (SINEMET IR) 25-100 MG tablet Take 1 tablet by mouth 3 (three) times daily. 10/31/15   Kathrynn Ducking, MD  propranolol (INDERAL) 20 MG tablet Take 1 tablet (20 mg total) by mouth 2 (two) times daily. 10/31/15   Kathrynn Ducking, MD  selegiline (ELDEPRYL) 5 MG tablet 1 tablet in the morning for 3 weeks, then take 1 tablet in the morning and 1 at noon 12/17/15   Kathrynn Ducking, MD    ROS:  Out of a complete 14  system review of symptoms, the patient complains only of the following symptoms, and all other reviewed systems are negative.  Fatigue Nausea, vomiting  Blood pressure 115/73, pulse 69, height 5\' 4"  (1.626 m), weight 153 lb 8 oz (69.6 kg).  Physical Exam  General: The patient is alert and cooperative at the time of the examination.  Skin: No significant peripheral edema is noted.   Neurologic Exam  Mental status: The patient is alert and oriented x 3 at the time of the examination. The patient has apparent normal recent and remote memory, with an apparently normal attention span and concentration ability.   Cranial nerves: Facial symmetry is present. Speech is normal, no aphasia or dysarthria is noted. Extraocular movements are full. Visual fields are full. Masking of the face is seen.  Motor: The patient has good strength in all 4 extremities.  Sensory examination: Soft touch sensation is symmetric on the face, arms, and legs.  Coordination: The patient has good finger-nose-finger and heel-to-shin bilaterally. Mild bilateral resting tremors of the hands are noted.  Gait and station: The patient has a normal gait. The patient is able to stand from a seated position with arms crossed. With walking, there appears to be fairly symmetric arm swing. Tandem gait is normal. Romberg is negative. No drift is seen.  Reflexes: Deep tendon reflexes are symmetric.   Assessment/Plan:  1. Parkinson's disease  The patient is doing well at this time, she is having some side effects from the medication, the selegiline will be reduced taking 5 mg in the morning, and the Sinemet will be reduced taking one half tablet at midday. Prescriptions for the propranolol, Sinemet, and selegiline were called in. The patient will follow-up in 6 months.  Jill Alexanders MD 05/04/2016 10:26 AM  [facility] [address] [place], [place] [address]  Phone [phone] Fax  [phone]

## 2016-05-04 NOTE — Patient Instructions (Signed)
    Reduce the Selegiline 5 mg tablet to one in the morning. Reduce the Sinemet (carbidopa) to 1 in the morning and evening and 1/2 at midday.

## 2016-05-09 DIAGNOSIS — W19XXXA Unspecified fall, initial encounter: Secondary | ICD-10-CM | POA: Diagnosis not present

## 2016-05-09 DIAGNOSIS — S60222A Contusion of left hand, initial encounter: Secondary | ICD-10-CM | POA: Diagnosis not present

## 2016-05-09 DIAGNOSIS — S0083XA Contusion of other part of head, initial encounter: Secondary | ICD-10-CM | POA: Diagnosis not present

## 2016-05-11 DIAGNOSIS — Z23 Encounter for immunization: Secondary | ICD-10-CM | POA: Diagnosis not present

## 2016-05-22 DIAGNOSIS — H2513 Age-related nuclear cataract, bilateral: Secondary | ICD-10-CM | POA: Diagnosis not present

## 2016-11-06 ENCOUNTER — Ambulatory Visit (INDEPENDENT_AMBULATORY_CARE_PROVIDER_SITE_OTHER): Payer: PPO | Admitting: Neurology

## 2016-11-06 ENCOUNTER — Encounter: Payer: Self-pay | Admitting: Neurology

## 2016-11-06 VITALS — BP 132/75 | HR 72 | Ht 64.0 in | Wt 157.5 lb

## 2016-11-06 DIAGNOSIS — G20 Parkinson's disease: Secondary | ICD-10-CM

## 2016-11-06 MED ORDER — PROPRANOLOL HCL 20 MG PO TABS: 20.0000 mg | ORAL_TABLET | Freq: Two times a day (BID) | ORAL | 11 refills | Status: DC

## 2016-11-06 MED ORDER — SELEGILINE HCL 5 MG PO TABS: ORAL_TABLET | ORAL | 11 refills | Status: DC

## 2016-11-06 MED ORDER — CARBIDOPA-LEVODOPA 25-100 MG PO TABS: ORAL_TABLET | ORAL | 11 refills | Status: DC

## 2016-11-06 NOTE — Progress Notes (Signed)
Reason for visit: Parkinson's disease  Chelsea Conley is an 66 y.o. female  History of present illness:  Chelsea Conley is a 66 year old left-handed white female with a history of Parkinson's disease. The patient has been on selegiline and Sinemet and she takes propranolol for the tremor. The patient seems to be doing quite well, she denies any significant issues since last seen. She feels better on a lower dose of the selegiline. The patient denies any falls, she denies issues with swallowing or drooling. She denies any issues with sleeping at night. She is trying to remain active. She returns to the office today for an evaluation.  Past Medical History:  Diagnosis Date  . History of seizures   . History of tremor   . Parkinson disease ([place]) 05/15/2015    Past Surgical History:  Procedure Laterality Date  . FOOT SURGERY Bilateral   . MOUTH SURGERY     X2  . TONSILLECTOMY      Family History  Problem Relation Age of Onset  . Diabetes Father   . Diabetes Sister   . Diabetes Brother   . Parkinson's disease Mother   . Parkinson's disease Maternal Grandfather     Social history:  reports that she has never smoked. She has never used smokeless tobacco. She reports that she drinks alcohol. She reports that she does not use drugs.   No Known Allergies  Medications:  Prior to Admission medications   Medication Sig Start Date End Date Taking? Authorizing Provider  carbidopa-levodopa (SINEMET IR) 25-100 MG tablet One tablet in the morning and evening, 1/2 tablet at midday 05/04/16  Yes Kathrynn Ducking, MD  propranolol (INDERAL) 20 MG tablet Take 1 tablet (20 mg total) by mouth 2 (two) times daily. 05/04/16  Yes Kathrynn Ducking, MD  selegiline (ELDEPRYL) 5 MG tablet 1 tablet in the morning 05/04/16  Yes Kathrynn Ducking, MD    ROS:  Out of a complete 14 system review of symptoms, the patient complains only of the following symptoms, and all other reviewed systems are  negative.  Tremor  Blood pressure 132/75, pulse 72, height 5\' 4"  (1.626 m), weight 157 lb 8 oz (71.4 kg).  Physical Exam  General: The patient is alert and cooperative at the time of the examination.  Skin: No significant peripheral edema is noted.   Neurologic Exam  Mental status: The patient is alert and oriented x 3 at the time of the examination. The patient has apparent normal recent and remote memory, with an apparently normal attention span and concentration ability.   Cranial nerves: Facial symmetry is present. Speech is normal, no aphasia or dysarthria is noted. Extraocular movements are full. Visual fields are full. Mild masking of the face is seen.  Motor: The patient has good strength in all 4 extremities.  Sensory examination: Soft touch sensation is symmetric on the face, arms, and legs.  Coordination: The patient has good finger-nose-finger and heel-to-shin bilaterally. Intermittent resting tremors noted with the left upper extremity.  Gait and station: The patient has a normal gait. The patient is able to arise from a seated position with arms crossed. Once up, the patient has a relatively normal gait, minimal decrease in arm swing bilaterally. Tandem gait is normal. Romberg is negative. No drift is seen.  Reflexes: Deep tendon reflexes are symmetric.   Assessment/Plan:  1. Parkinson's disease  The patient is doing quite well at this time. She is tolerating her medications, she has reported  some mild dyskinesias recently involving the head and neck. The patient will remain on her current dose of medications, she will follow-up in 6 months. Prescriptions were sent in for her medications today.  Jill Alexanders MD 11/06/2016 10:13 AM  [facility] [address] [place], [place] [address]  Phone [phone] Fax [phone]

## 2016-11-30 DIAGNOSIS — Z124 Encounter for screening for malignant neoplasm of cervix: Secondary | ICD-10-CM | POA: Diagnosis not present

## 2016-11-30 DIAGNOSIS — Z1231 Encounter for screening mammogram for malignant neoplasm of breast: Secondary | ICD-10-CM | POA: Diagnosis not present

## 2016-11-30 DIAGNOSIS — Z6828 Body mass index (BMI) 28.0-28.9, adult: Secondary | ICD-10-CM | POA: Diagnosis not present

## 2017-01-18 DIAGNOSIS — N958 Other specified menopausal and perimenopausal disorders: Secondary | ICD-10-CM | POA: Diagnosis not present

## 2017-01-18 DIAGNOSIS — M816 Localized osteoporosis [Lequesne]: Secondary | ICD-10-CM | POA: Diagnosis not present

## 2017-04-01 DIAGNOSIS — K5900 Constipation, unspecified: Secondary | ICD-10-CM | POA: Diagnosis not present

## 2017-04-01 DIAGNOSIS — Z1211 Encounter for screening for malignant neoplasm of colon: Secondary | ICD-10-CM | POA: Diagnosis not present

## 2017-04-01 DIAGNOSIS — K7689 Other specified diseases of liver: Secondary | ICD-10-CM | POA: Diagnosis not present

## 2017-05-10 DIAGNOSIS — D123 Benign neoplasm of transverse colon: Secondary | ICD-10-CM | POA: Diagnosis not present

## 2017-05-10 DIAGNOSIS — D120 Benign neoplasm of cecum: Secondary | ICD-10-CM | POA: Diagnosis not present

## 2017-05-10 DIAGNOSIS — K635 Polyp of colon: Secondary | ICD-10-CM | POA: Diagnosis not present

## 2017-05-10 DIAGNOSIS — Z1211 Encounter for screening for malignant neoplasm of colon: Secondary | ICD-10-CM | POA: Diagnosis not present

## 2017-05-14 ENCOUNTER — Ambulatory Visit (INDEPENDENT_AMBULATORY_CARE_PROVIDER_SITE_OTHER): Payer: PPO | Admitting: Neurology

## 2017-05-14 ENCOUNTER — Encounter: Payer: Self-pay | Admitting: Neurology

## 2017-05-14 VITALS — BP 121/70 | HR 68 | Ht 64.0 in | Wt 158.0 lb

## 2017-05-14 DIAGNOSIS — G20 Parkinson's disease: Secondary | ICD-10-CM | POA: Diagnosis not present

## 2017-05-14 MED ORDER — PROPRANOLOL HCL 20 MG PO TABS: 20.0000 mg | ORAL_TABLET | Freq: Two times a day (BID) | ORAL | 3 refills | Status: DC

## 2017-05-14 MED ORDER — CARBIDOPA-LEVODOPA 25-100 MG PO TABS
ORAL_TABLET | ORAL | 3 refills | Status: DC
Start: 2017-05-14 — End: 2017-11-15

## 2017-05-14 MED ORDER — SELEGILINE HCL 5 MG PO TABS: ORAL_TABLET | ORAL | 3 refills | Status: DC

## 2017-05-14 NOTE — Progress Notes (Signed)
Reason for visit: Parkinson's disease  Chelsea Conley is an 65 y.o. female  History of present illness:  Chelsea Conley is a 66 year old left-handed white female with a history of Parkinson's disease.  She has left-sided features with a tremor, occasionally she may have dyskinesias of the head and neck that may come and go, worse when she is somewhat anxious or nervous.  She takes propranolol for the tremor which seems to help.  The patient remains on low-dose Sinemet, she has good mobility, she denies any issues getting up from a chair or walking across her room.  She has not had any falls.  She denies any changes with speech or swallowing or has difficulty with sleeping at night.  She has not altered any of her activities of daily living or had any change in her mobility since last seen.  Past Medical History:  Diagnosis Date  . History of seizures   . History of tremor   . Parkinson disease ([place]) 05/15/2015    Past Surgical History:  Procedure Laterality Date  . FOOT SURGERY Bilateral   . MOUTH SURGERY     X2  . TONSILLECTOMY      Family History  Problem Relation Age of Onset  . Diabetes Father   . Diabetes Sister   . Diabetes Brother   . Parkinson's disease Mother   . Parkinson's disease Maternal Grandfather     Social history:  reports that she has never smoked. She has never used smokeless tobacco. She reports that she drinks alcohol. She reports that she does not use drugs.   No Known Allergies  Medications:  Prior to Admission medications   Medication Sig Start Date End Date Taking? Authorizing Provider  carbidopa-levodopa (SINEMET IR) 25-100 MG tablet One tablet in the morning and evening, 1/2 tablet at midday 11/06/16  Yes Kathrynn Ducking, MD  propranolol (INDERAL) 20 MG tablet Take 1 tablet (20 mg total) by mouth 2 (two) times daily. 11/06/16  Yes Kathrynn Ducking, MD  selegiline (ELDEPRYL) 5 MG tablet 1 tablet in the morning 11/06/16  Yes Kathrynn Ducking,  MD    ROS:  Out of a complete 14 system review of symptoms, the patient complains only of the following symptoms, and all other reviewed systems are negative.  Tremors Neck pain  Blood pressure 121/70, pulse 68, height 5\' 4"  (1.626 m), weight 158 lb (71.7 kg).  Physical Exam  General: The patient is alert and cooperative at the time of the examination.  Skin: No significant peripheral edema is noted.   Neurologic Exam  Mental status: The patient is alert and oriented x 3 at the time of the examination. The patient has apparent normal recent and remote memory, with an apparently normal attention span and concentration ability.   Cranial nerves: Facial symmetry is present. Speech is normal, no aphasia or dysarthria is noted. Extraocular movements are full. Visual fields are full.  Mild masking of the face is seen.  Motor: The patient has good strength in all 4 extremities.  Sensory examination: Soft touch sensation is symmetric on the face, arms, and legs.  Coordination: The patient has good finger-nose-finger and heel-to-shin bilaterally.  A resting tremor seen on the left upper extremity.  Gait and station: The patient is able to rise from a seated position with arms crossed.  Once up, she can walk independently with good stride, good turns.  If she has relatively good arm swing bilaterally, but tremor seen on the  left hand with walking. Tandem gait is normal. Romberg is negative. No drift is seen.  Reflexes: Deep tendon reflexes are symmetric.   Assessment/Plan:  1.  Parkinson's disease  The patient is doing well with her mobility currently, we will not alter medication dosing.  The patient will follow-up in 6 months.  A prescription was sent in for a 90-day supply of her propranolol, Sinemet, and selegiline.  Jill Alexanders MD 05/14/2017 11:02 AM  [facility] [address] [place], [place] [address]  Phone [phone] Fax  [phone]

## 2017-05-28 DIAGNOSIS — Z23 Encounter for immunization: Secondary | ICD-10-CM | POA: Diagnosis not present

## 2017-08-16 DIAGNOSIS — H5213 Myopia, bilateral: Secondary | ICD-10-CM | POA: Diagnosis not present

## 2017-08-16 DIAGNOSIS — H52223 Regular astigmatism, bilateral: Secondary | ICD-10-CM | POA: Diagnosis not present

## 2017-08-16 DIAGNOSIS — H524 Presbyopia: Secondary | ICD-10-CM | POA: Diagnosis not present

## 2017-09-06 ENCOUNTER — Telehealth: Payer: Self-pay | Admitting: Neurology

## 2017-09-06 MED ORDER — AMANTADINE HCL 100 MG PO CAPS: 100.0000 mg | ORAL_CAPSULE | Freq: Two times a day (BID) | ORAL | 3 refills | Status: DC

## 2017-09-06 NOTE — Addendum Note (Signed)
Addended by: Kathrynn Ducking on: 09/06/2017 11:09 AM   Modules accepted: Orders

## 2017-09-06 NOTE — Telephone Encounter (Signed)
I called the patient.  The patient has known dyskinesias involving the head and neck, these are starting to bother her with discomfort in the neck.  We will add low-dose Symmetrel to her regimen.  She will take 100 mg twice daily.

## 2017-09-06 NOTE — Telephone Encounter (Signed)
Patient calling. She has had involuntary head movement for a few months and it is making her neck very sore. What should she do? Please call and advise.

## 2017-09-24 DIAGNOSIS — G20 Parkinson's disease: Secondary | ICD-10-CM | POA: Diagnosis not present

## 2017-09-24 DIAGNOSIS — M542 Cervicalgia: Secondary | ICD-10-CM | POA: Diagnosis not present

## 2017-09-24 DIAGNOSIS — M62838 Other muscle spasm: Secondary | ICD-10-CM | POA: Diagnosis not present

## 2017-10-21 DIAGNOSIS — M542 Cervicalgia: Secondary | ICD-10-CM | POA: Diagnosis not present

## 2017-10-22 DIAGNOSIS — M542 Cervicalgia: Secondary | ICD-10-CM | POA: Diagnosis not present

## 2017-10-25 ENCOUNTER — Other Ambulatory Visit: Payer: Self-pay | Admitting: Chiropractic Medicine

## 2017-10-25 ENCOUNTER — Ambulatory Visit
Admission: RE | Admit: 2017-10-25 | Discharge: 2017-10-25 | Disposition: A | Discharge status: Home / Self Care | Payer: Self-pay | Source: Ambulatory Visit | Attending: Chiropractic Medicine | Admitting: Chiropractic Medicine

## 2017-10-25 DIAGNOSIS — R52 Pain, unspecified: Secondary | ICD-10-CM

## 2017-10-25 DIAGNOSIS — M47812 Spondylosis without myelopathy or radiculopathy, cervical region: Secondary | ICD-10-CM | POA: Diagnosis not present

## 2017-10-27 DIAGNOSIS — M542 Cervicalgia: Secondary | ICD-10-CM | POA: Diagnosis not present

## 2017-11-02 DIAGNOSIS — M542 Cervicalgia: Secondary | ICD-10-CM | POA: Diagnosis not present

## 2017-11-15 ENCOUNTER — Ambulatory Visit: Payer: PPO | Admitting: Neurology

## 2017-11-15 ENCOUNTER — Other Ambulatory Visit: Payer: Self-pay

## 2017-11-15 ENCOUNTER — Encounter: Payer: Self-pay | Admitting: Neurology

## 2017-11-15 VITALS — BP 125/79 | HR 81 | Wt 149.5 lb

## 2017-11-15 DIAGNOSIS — G20 Parkinson's disease: Secondary | ICD-10-CM

## 2017-11-15 MED ORDER — CARBIDOPA-LEVODOPA 25-100 MG PO TABS: ORAL_TABLET | ORAL | 3 refills | Status: DC

## 2017-11-15 MED ORDER — AMANTADINE HCL 100 MG PO CAPS: 100.0000 mg | ORAL_CAPSULE | Freq: Three times a day (TID) | ORAL | 1 refills | Status: DC

## 2017-11-15 NOTE — Patient Instructions (Signed)
   We will reduce the Sinemet 25/100 mg tablet to 1/2 tablet three times a day.  Increase the Symmetrel to 100 mg three times a day.

## 2017-11-15 NOTE — Progress Notes (Signed)
Reason for visit: Parkinson's disease   Chelsea Conley is an 67 y.o. female  History of present illness:  Chelsea Conley is a 67 year old left-handed white female with a history of Parkinson's disease.  The patient has had gradually worsening problems with dyskinesias.  The patient is on fairly low-dose Sinemet taking the 25/100 mg tablets, 1 in the morning and evening and one half at midday.  She has been placed on Symmetrel taking 100 mg twice daily which seemed to help the dyskinesias initially but this effect has worn off.  The patient does not note any relationship between Sinemet dosing and the dyskinesias, the dyskinesias are worse in the morning, better as the day goes on.  The patient takes propranolol for tremors which seem to help some.  She also reports chronic insomnia, she has difficulty getting to sleep and then she will wake up several hours after she goes to sleep and then cannot get back to sleep.  The patient has recently had some problems with nausea after returning from a trip out [place].  The nausea has been present for about 1 week.  She returns to this office for an evaluation.  Past Medical History:  Diagnosis Date  . History of seizures   . History of tremor   . Parkinson disease ([place]) 05/15/2015    Past Surgical History:  Procedure Laterality Date  . FOOT SURGERY Bilateral   . MOUTH SURGERY     X2  . TONSILLECTOMY      Family History  Problem Relation Age of Onset  . Diabetes Father   . Diabetes Sister   . Diabetes Brother   . Parkinson's disease Mother   . Parkinson's disease Maternal Grandfather     Social history:  reports that she has never smoked. She has never used smokeless tobacco. She reports that she drinks alcohol. She reports that she does not use drugs.   No Known Allergies  Medications:  Prior to Admission medications   Medication Sig Start Date End Date Taking? Authorizing Provider  amantadine (SYMMETREL) 100 MG capsule Take 1  capsule (100 mg total) by mouth 2 (two) times daily. 09/06/17  Yes Kathrynn Ducking, MD  carbidopa-levodopa (SINEMET IR) 25-100 MG tablet One tablet in the morning and evening, 1/2 tablet at midday 05/14/17  Yes Kathrynn Ducking, MD  propranolol (INDERAL) 20 MG tablet Take 1 tablet (20 mg total) by mouth 2 (two) times daily. 05/14/17  Yes Kathrynn Ducking, MD  selegiline (ELDEPRYL) 5 MG tablet 1 tablet in the morning 05/14/17  Yes Kathrynn Ducking, MD    ROS:  Out of a complete 14 system review of symptoms, the patient complains only of the following symptoms, and all other reviewed systems are negative.  Nausea Insomnia  Blood pressure 125/79, pulse 81, weight 149 lb 8 oz (67.8 kg).  Physical Exam  General: The patient is alert and cooperative at the time of the examination.  Skin: No significant peripheral edema is noted.   Neurologic Exam  Mental status: The patient is alert and oriented x 3 at the time of the examination. The patient has apparent normal recent and remote memory, with an apparently normal attention span and concentration ability.   Cranial nerves: Facial symmetry is present. Speech is normal, no aphasia or dysarthria is noted. Extraocular movements are full. Visual fields are full.  Dyskinesias of the head and neck are noted.  Motor: The patient has good strength in all 4 extremities.  Sensory examination: Soft touch sensation is symmetric on the face, arms, and legs.  Coordination: The patient has good finger-nose-finger and heel-to-shin bilaterally.  Gait and station: The patient has a normal gait.  The patient is able to arise from seated position with arms crossed.  Tandem gait is normal. Romberg is negative. No drift is seen.  Reflexes: Deep tendon reflexes are symmetric.   Assessment/Plan:  1.  Parkinson's disease  2.  Dyskinesias of head and neck  3.  Tremor  The patient is on very low-dose Sinemet, but she still has prominent and worsening  dyskinesias of the head neck.  The Sinemet dose will be reduced to one half of a 25/100 mg tablet 3 times daily.  The patient will go up on the Symmetrel taking 100 mg 3 times daily.  We will consider a medication such as trazodone for sleep in the near future.  The patient will follow-up in 6 months.  The dyskinesias of the head and neck have gotten to the point in the past where it causes neck and shoulder discomfort.  Jill Alexanders MD 11/15/2017 9:40 AM  [facility] [address] [place], [place] [address]  Phone [phone] Fax [phone]

## 2017-11-24 DIAGNOSIS — M542 Cervicalgia: Secondary | ICD-10-CM | POA: Diagnosis not present

## 2017-12-16 DIAGNOSIS — M542 Cervicalgia: Secondary | ICD-10-CM | POA: Diagnosis not present

## 2017-12-20 DIAGNOSIS — R231 Pallor: Secondary | ICD-10-CM | POA: Diagnosis not present

## 2017-12-20 DIAGNOSIS — R21 Rash and other nonspecific skin eruption: Secondary | ICD-10-CM | POA: Diagnosis not present

## 2017-12-31 DIAGNOSIS — E875 Hyperkalemia: Secondary | ICD-10-CM | POA: Diagnosis not present

## 2018-03-07 ENCOUNTER — Telehealth: Payer: Self-pay | Admitting: Neurology

## 2018-03-07 NOTE — Telephone Encounter (Signed)
I called the patient.  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  This likely is a side effect from the Symmetrel.  The Symmetrel has helped her dyskinesias significantly, we made try cutting back to 100 mg twice a day instead of stopping the medication.  Patient will keep in touch concerning this, if the side effects become intolerable, we may have to stop the Symmetrel altogether.

## 2018-03-07 NOTE — Telephone Encounter (Signed)
Patient states since June she has had swelling of feet, ankles and red blotches on her feet and part of legs.She thinks this is from amantadine (SYMMETREL) 100 MG capsule. Please call and discuss.

## 2018-05-20 ENCOUNTER — Encounter: Payer: Self-pay | Admitting: Neurology

## 2018-05-20 ENCOUNTER — Other Ambulatory Visit: Payer: Self-pay

## 2018-05-20 ENCOUNTER — Ambulatory Visit (INDEPENDENT_AMBULATORY_CARE_PROVIDER_SITE_OTHER): Payer: PPO | Admitting: Neurology

## 2018-05-20 VITALS — BP 126/80 | HR 70 | Resp 18 | Ht 64.0 in | Wt 154.0 lb

## 2018-05-20 DIAGNOSIS — G20 Parkinson's disease: Secondary | ICD-10-CM

## 2018-05-20 MED ORDER — AMANTADINE HCL 100 MG PO CAPS: 100.0000 mg | ORAL_CAPSULE | Freq: Two times a day (BID) | ORAL | 1 refills | Status: DC

## 2018-05-20 MED ORDER — PROPRANOLOL HCL 20 MG PO TABS: 20.0000 mg | ORAL_TABLET | Freq: Two times a day (BID) | ORAL | 3 refills | Status: DC

## 2018-05-20 MED ORDER — CARBIDOPA-LEVODOPA 25-100 MG PO TABS: ORAL_TABLET | ORAL | 3 refills | Status: DC

## 2018-05-20 MED ORDER — SELEGILINE HCL 5 MG PO TABS: ORAL_TABLET | ORAL | 3 refills | Status: DC

## 2018-05-20 NOTE — Progress Notes (Signed)
Reason for visit: Parkinson's disease  Chelsea Conley is an 67 y.o. female  History of present illness:  Chelsea Conley is a 67 year old left-handed white female with a history of Parkinson's disease.  The patient has tremors involving both upper extremities, she is on very low-dose Sinemet but has had some problems with dyskinesias.  She takes the 25/100 mg Sinemet taking 1/2 tablet 3 times daily.  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  The Symmetrel has significantly improved the dyskinesias, her neck pain has improved.  The patient has not had any falls but she is now having problems with freezing when she initiates ambulation.  She denies any issues with swallowing or choking.  She has had some problems with yelling out at night, she has vivid dreams at night, she has not fallen out of bed.  The patient returns to this office for an evaluation.  The patient remains on propranolol for her tremor and she takes Eldepryl.  Past Medical History:  Diagnosis Date  . History of seizures   . History of tremor   . Parkinson disease ([place]) 05/15/2015    Past Surgical History:  Procedure Laterality Date  . FOOT SURGERY Bilateral   . MOUTH SURGERY     X2  . TONSILLECTOMY      Family History  Problem Relation Age of Onset  . Diabetes Father   . Diabetes Sister   . Diabetes Brother   . Parkinson's disease Mother   . Parkinson's disease Maternal Grandfather     Social history:  reports that she has never smoked. She has never used smokeless tobacco. She reports that she drinks alcohol. She reports that she does not use drugs.   No Known Allergies  Medications:  Prior to Admission medications   Medication Sig Start Date End Date Taking? Authorizing Provider  amantadine (SYMMETREL) 100 MG capsule Take 1 capsule (100 mg total) by mouth 3 (three) times daily. 11/15/17  Yes Kathrynn Ducking, MD  carbidopa-levodopa  (SINEMET IR) 25-100 MG tablet 1/2 tablet three times a day 11/15/17  Yes Kathrynn Ducking, MD  FLUAD 0.5 ML SUSY ADM 0.5ML IM UTD 05/06/18  Yes [provider]  propranolol (INDERAL) 20 MG tablet Take 1 tablet (20 mg total) by mouth 2 (two) times daily. 05/14/17  Yes Kathrynn Ducking, MD  selegiline (ELDEPRYL) 5 MG tablet 1 tablet in the morning 05/14/17  Yes Kathrynn Ducking, MD    ROS:  Out of a complete 14 system review of symptoms, the patient complains only of the following symptoms, and all other reviewed systems are negative.  Tremor Vivid dreams  Blood pressure 126/80, pulse 70, resp. rate 18, height 5\' 4"  (1.626 m), weight 154 lb (69.9 kg).  Physical Exam  General: The patient is alert and cooperative at the time of the examination.  Skin: No significant peripheral edema is noted.   Neurologic Exam  Mental status: The patient is alert and oriented x 3 at the time of the examination. The patient has apparent normal recent and remote memory, with an apparently normal attention span and concentration ability.   Cranial nerves: Facial symmetry is present. Speech is normal, no aphasia or dysarthria is noted. Extraocular movements are full. Visual fields are full.  Mild masking of the face is seen.  Motor: The patient has good strength in all 4 extremities.  Sensory examination: Soft touch sensation is symmetric on  the face, arms, and legs.  Coordination: The patient has good finger-nose-finger and heel-to-shin bilaterally.  Gait and station: The patient is able to arise from seated position with arms crossed.  Once up, the patient can ambulate without assistance.  There is decreased arm swing bilaterally, tremors are seen in both upper extremities.  Tandem gait is normal. Romberg is negative. No drift is seen.  Reflexes: Deep tendon reflexes are symmetric.   Assessment/Plan:  1.  Parkinson's disease  2.  Dyskinesias  3.  REM sleep behavior disorder, vivid  dreams  The patient will be increased on the Sinemet slightly taking 1 full tablet in the morning and half tablet at midday and evening, the patient is having some freezing episodes that are worse in the morning.  The patient will continue on her Symmetrel, a prescription for propranolol and Eldepryl were called in.  The patient will follow-up in 6 months, she will call for any dose adjustments of her medication.  If the sleep issues become a significant issue for her, clonazepam can be added in the evening.  Jill Alexanders MD 05/20/2018 8:56 AM  [facility] [address] [place], [place] [address]  Phone [phone] Fax [phone]

## 2018-05-20 NOTE — Patient Instructions (Signed)
We will go up on the Sinemet 25/100 mg tablet to one in the morning and 1/2 at midday and evening   Sinemet (carbidopa) may result in confusion or hallucinations, drowsiness, nausea, or dizziness. If any significant side effects are noted, please contact our office. Sinemet may not be well absorbed when taken with high protein meals, if tolerated it is best to take 30-45 minutes before you eat.

## 2018-07-08 ENCOUNTER — Other Ambulatory Visit: Payer: Self-pay | Admitting: Neurology

## 2018-07-08 ENCOUNTER — Telehealth: Payer: Self-pay | Admitting: Neurology

## 2018-07-08 MED ORDER — CARBIDOPA-LEVODOPA 25-100 MG PO TABS: ORAL_TABLET | ORAL | 3 refills | Status: DC

## 2018-07-08 MED ORDER — AMANTADINE HCL 100 MG PO CAPS: 100.0000 mg | ORAL_CAPSULE | Freq: Two times a day (BID) | ORAL | 1 refills | Status: DC

## 2018-07-08 NOTE — Telephone Encounter (Signed)
Per Dr. Jannifer Franklin' last note, pt is taking symmetrel 100mg  BID and sinemet 25-100mg  1 tablet in the AM and 1/2 tablet in the afternoon and evening. Dr. Jannifer Franklin' ordered these meds at the last visit but did not send them to the pharmacy. Refills completed.

## 2018-07-08 NOTE — Telephone Encounter (Signed)
Pt is requesting refills for amantadine (SYMMETREL) 100 MG capsule, carbidopa-levodopa (SINEMET IR) 25-100 MG tablet sent to [facility], [place]. Pt will be out of the amantadine in 3 days.

## 2018-08-26 ENCOUNTER — Ambulatory Visit: Payer: PPO | Admitting: Podiatry

## 2018-08-26 ENCOUNTER — Encounter: Payer: Self-pay | Admitting: Podiatry

## 2018-08-26 DIAGNOSIS — L600 Ingrowing nail: Secondary | ICD-10-CM | POA: Diagnosis not present

## 2018-08-26 MED ORDER — NEOMYCIN-POLYMYXIN-HC 3.5-10000-1 OT SOLN: OTIC | 1 refills | Status: DC

## 2018-08-26 NOTE — Patient Instructions (Signed)

## 2018-08-28 NOTE — Progress Notes (Signed)
Subjective:   Patient ID: [place], female   DOB: 68 y.o.   MRN: [id number]   HPI Patient states she has chronic ingrown toenail deformity of her big toe both feet and she cannot find any shoes that are comfortable and is wearing open toed shoes which is difficult to do.  She is tried to trim and soak without relief and patient does not smoke and likes to be active   Review of Systems  All other systems reviewed and are negative.       Objective:  Physical Exam Vitals signs and nursing note reviewed.  Constitutional:      Appearance: She is well-developed.  Pulmonary:     Effort: Pulmonary effort is normal.  Musculoskeletal: Normal range of motion.  Skin:    General: Skin is warm.  Neurological:     Mental Status: She is alert.     Neurovascular status intact muscle strength is adequate range of motion within normal limits with patient found to have incurvated right hallux lateral border left hallux medial border that are painful when pressed with no active drainage or redness noted.  The nails are deformed and chronic in their nature     Assessment:  Ingrown toenail deformity hallux bilateral secondary to foot and nail structure     Plan:  H&P education concerning condition rendered and allowed patient to read consent form for correction bilateral.  Explained procedure risk and today infiltrated each hallux 60 mg Xylocaine Marcaine mixture sterile prep applied to each big toe using sterile instrumentation I remove the lateral border right hallux medial border left hallux exposed matrix and applied phenol 3 applications 30 seconds followed by alcohol lavage and sterile dressing.  Gave instructions on soaks and wrote prescription for drops and encouraged to leave dressing on 24 hours but to take it off earlier if any throbbing were to occur.  Patient will call with any questions concerns she may have

## 2018-09-23 ENCOUNTER — Telehealth: Payer: Self-pay | Admitting: Neurology

## 2018-09-23 MED ORDER — AMANTADINE HCL 100 MG PO CAPS: 100.0000 mg | ORAL_CAPSULE | Freq: Three times a day (TID) | ORAL | 1 refills | Status: DC

## 2018-09-23 NOTE — Telephone Encounter (Signed)
I called the patient.  The patient has had increased issues with dyskinesias on the Sinemet with the lower dose of Symmetrel, will go back to 100 mg 3 times a day, the reason we reduce the dose was because of the livedo reticularis that developed on the medication.

## 2018-09-23 NOTE — Telephone Encounter (Signed)
I reached out to the patient to obtain more information in regards to this message.   She states since decreasing down to two tables of the amantadine her neck pain has not been well controled. She states the 3 tables daily was decreased due to some swelling/red patches she had on her lower legs.  Pt wanted to know what Dr. Jannifer Franklin would recommended to help control her neck pain? I advised I would fwd message to Dr. Jannifer Franklin to further review. Pt was agreeable.

## 2018-09-23 NOTE — Telephone Encounter (Signed)
Patient is taking amantadine (SYMMETREL) 100 MG capsule and was moved down to 2 times and day and she is requesting to be moved back to 3 times a day

## 2018-12-14 ENCOUNTER — Telehealth: Payer: Self-pay | Admitting: Neurology

## 2018-12-14 NOTE — Telephone Encounter (Signed)
Due to current COVID 19 pandemic, our office is severely reducing in office visits until further notice, in order to minimize the risk to our patients and healthcare providers.   Called patient and confirmed a virtual visit for her 6/8 appointment. Patient verbalized understanding of the doxy.me process and I have sent her an e-mail with link and directions as well as my name and office number/hours for reference. Patient understands that she will receive a call from RN to update chart.  Pt understands that although there may be some limitations with this type of visit, we will take all precautions to reduce any security or privacy concerns.  Pt understands that this will be treated like an in office visit and we will file with pt's insurance, and there may be a patient responsible charge related to this service.

## 2018-12-14 NOTE — Addendum Note (Signed)
Addended by: Verlin Grills T on: 12/14/2018 03:51 PM   Modules accepted: Orders

## 2018-12-14 NOTE — Telephone Encounter (Signed)
I reached out to the pt and we were able to complete the pre charting for 12/15/18 visit.  Pt confirmed she received the e-mail link for the visit.

## 2018-12-19 ENCOUNTER — Other Ambulatory Visit: Payer: Self-pay

## 2018-12-19 ENCOUNTER — Encounter: Payer: Self-pay | Admitting: Neurology

## 2018-12-19 ENCOUNTER — Ambulatory Visit (INDEPENDENT_AMBULATORY_CARE_PROVIDER_SITE_OTHER): Payer: PPO | Admitting: Neurology

## 2018-12-19 DIAGNOSIS — G20 Parkinson's disease: Secondary | ICD-10-CM | POA: Diagnosis not present

## 2018-12-19 MED ORDER — CARBIDOPA-LEVODOPA 25-100 MG PO TABS: 1.0000 | ORAL_TABLET | Freq: Three times a day (TID) | ORAL | 3 refills | Status: DC

## 2018-12-19 MED ORDER — SELEGILINE HCL 5 MG PO TABS: ORAL_TABLET | ORAL | 3 refills | Status: DC

## 2018-12-19 NOTE — Progress Notes (Signed)
     Virtual Visit via Video Note  I connected with [place] on 12/19/18 at 10:00 AM EDT by a video enabled telemedicine application and verified that I am speaking with the correct person using two identifiers.  Location: Patient: The patient is at home. Provider: Physician in office.   I discussed the limitations of evaluation and management by telemedicine and the availability of in person appointments. The patient expressed understanding and agreed to proceed.  History of Present Illness: [place] is a 68 year old left-handed white female with a history of Parkinson's disease.  The patient has tremors in both arms, she takes propranolol which seems to help some.  She is on very low-dose Sinemet taking 1 full tablet of the 25/100 mg tablets in the morning and one half at midday and evening.  She is on selegiline taking 5 mg in the morning.  She is having more hesitancy when initiating ambulation.  She has not had any falls.  She sometimes will have trouble with having food stuck in her throat, usually with solids, she swallows liquids better.  She has had some problems with verbalizing during her sleep, she has not fallen out of bed.  She feels fairly rested during the day.  She is exercising on a regular basis.  She remains on Symmetrel taking 100 mg 3 times daily, but this has reduced her dyskinesias.   Observations/Objective: Evaluation today shows minimal masking of the face, the patient has good extraocular movements.  She is alert and cooperative.  Speech is well enunciated, not aphasic or dysarthric.  She has good facial symmetry, she is able to protrude the tongue in the midline with good lateral movement of the tongue.  She has good finger-nose-finger bilaterally.  Resting tremors are noted with both arms.  She has good heel-to-shin.  Gait is normal.  Minimal reduction in arm swing is seen.  Tandem gait is unremarkable.  Romberg is negative.  Assessment and Plan: 1.   Parkinson's disease  2.  Tremor  The patient will be increased on Sinemet, she is having more hesitancy with walking.  She will take the full Sinemet tablet 3 times daily.  The selegiline will be increased to 5 mg in the morning and 5 mg at noon.  She will follow-up in about 5 months.  A prescription was sent in for the Sinemet and for the selegiline.  Follow Up Instructions:    I discussed the assessment and treatment plan with the patient. The patient was provided an opportunity to ask questions and all were answered. The patient agreed with the plan and demonstrated an understanding of the instructions.   The patient was advised to call back or seek an in-person evaluation if the symptoms worsen or if the condition fails to improve as anticipated.  I provided 25 minutes of non-face-to-face time during this encounter.   Kathrynn Ducking, MD

## 2018-12-21 ENCOUNTER — Encounter: Payer: Self-pay | Admitting: Orthopaedic Surgery

## 2018-12-21 ENCOUNTER — Other Ambulatory Visit: Payer: Self-pay

## 2018-12-21 ENCOUNTER — Ambulatory Visit (INDEPENDENT_AMBULATORY_CARE_PROVIDER_SITE_OTHER): Payer: PPO

## 2018-12-21 ENCOUNTER — Ambulatory Visit (INDEPENDENT_AMBULATORY_CARE_PROVIDER_SITE_OTHER): Payer: PPO | Admitting: Orthopaedic Surgery

## 2018-12-21 DIAGNOSIS — M1811 Unilateral primary osteoarthritis of first carpometacarpal joint, right hand: Secondary | ICD-10-CM

## 2018-12-21 DIAGNOSIS — M79641 Pain in right hand: Secondary | ICD-10-CM | POA: Diagnosis not present

## 2018-12-21 HISTORY — DX: Unilateral primary osteoarthritis of first carpometacarpal joint, right hand: M18.11

## 2018-12-21 NOTE — Progress Notes (Signed)
Office Visit Note   Patient: [place]           Date of Birth: 01/04/1951           MRN: [id number] Visit Date: 12/21/2018              Requested by: [facility] [address], [place] [address] PCP: [facility]   Assessment & Plan: Visit Diagnoses:  1. Pain in right hand   2. Arthritis of carpometacarpal (CMC) joint of right thumb     Plan: I have recommended Voltaren gel that she can get over-the-counter for her thumb.  There is not even any space around the thumb to consider a steroid injection.  I did talk to her about the possibility of surgery at some point if he gets bad enough and I would refer her to a hand surgeon for this.  All question concerns were answered and addressed.  Follow-Up Instructions: Return if symptoms worsen or fail to improve.   Orders:  Orders Placed This Encounter  Procedures  . XR Hand Complete Right   No orders of the defined types were placed in this encounter.     Procedures: No procedures performed   Clinical Data: No additional findings.   Subjective: Chief Complaint  Patient presents with  . Right Hand - Pain  The patient is a very pleasant left-hand-dominant 68 year old female who comes in with right thumb pain is been hurting off and on for some time now.  It was hurting really bad when she made her appointment for the last few days it is not hurt as bad so she kept her appointment.  She denies any locking catching or triggering of the thumb.  She denies any numbness and tingling.  She denies any injuries.  Again she is left-hand dominant.  She points to the basilar thumb joint as a source of her pain.  HPI  Review of Systems She currently denies any headache, chest pain, shortness of breath, fever, chills, nausea, vomiting  Objective: Vital Signs: There were no vitals taken for this visit.  Physical Exam She is alert and orient x3 and in no  acute distress Ortho Exam Examination of her right hand she has no significant muscle atrophy.  She has a positive grind test of the basilar thumb joint.  It is definitely painful all around the base of her thumb joint as well.  There is no active triggering.  Her fingers are all well-perfused with normal sensation.  She does have weakness with pinch and grip strength of that hand. Specialty Comments:  No specialty comments available.  Imaging: Xr Hand Complete Right  Result Date: 12/21/2018 3 views of the right hand shows severe carpal metacarpal joint arthritis at the basilar thumb joint.  There is complete loss of the joint space and large osteophytes around the joint.    PMFS History: Patient Active Problem List   Diagnosis Date Noted  . Arthritis of carpometacarpal [facility]) joint of right thumb 12/21/2018  . HLD (hyperlipidemia) 05/15/2015  . Seizure ([place]) 05/15/2015  . Has a tremor 05/15/2015  . Parkinson disease ([place]) 05/15/2015   Past Medical History:  Diagnosis Date  . History of seizures   . History of tremor   . Parkinson disease ([place]) 05/15/2015    Family History  Problem Relation Age of Onset  . Diabetes Father   . Diabetes Sister   . Diabetes Brother   .  Parkinson's disease Mother   . Parkinson's disease Maternal Grandfather     Past Surgical History:  Procedure Laterality Date  . FOOT SURGERY Bilateral   . MOUTH SURGERY     X2  . TONSILLECTOMY     Social History   Occupational History    Employer: [employer]    Comment: [employer]  Tobacco Use  . Smoking status: Never Smoker  . Smokeless tobacco: Never Used  Substance and Sexual Activity  . Alcohol use: Yes    Alcohol/week: 0.0 standard drinks    Comment: Rare   . Drug use: No  . Sexual activity: Not on file

## 2019-03-29 ENCOUNTER — Other Ambulatory Visit: Payer: Self-pay | Admitting: Neurology

## 2019-03-30 ENCOUNTER — Telehealth: Payer: Self-pay | Admitting: Neurology

## 2019-03-30 ENCOUNTER — Other Ambulatory Visit: Payer: Self-pay

## 2019-03-30 MED ORDER — AMANTADINE HCL 100 MG PO CAPS: 100.0000 mg | ORAL_CAPSULE | Freq: Three times a day (TID) | ORAL | 1 refills | Status: DC

## 2019-03-30 NOTE — Telephone Encounter (Signed)
Pt has called to inform she is going out of town at 2:00 and she needs this refill before then

## 2019-03-30 NOTE — Telephone Encounter (Signed)
Amantadine refill sent to listed pharmacy per pt request.

## 2019-04-18 DIAGNOSIS — Z23 Encounter for immunization: Secondary | ICD-10-CM | POA: Diagnosis not present

## 2019-05-22 ENCOUNTER — Ambulatory Visit (INDEPENDENT_AMBULATORY_CARE_PROVIDER_SITE_OTHER): Payer: PPO | Admitting: Neurology

## 2019-05-22 ENCOUNTER — Other Ambulatory Visit: Payer: Self-pay

## 2019-05-22 ENCOUNTER — Encounter: Payer: Self-pay | Admitting: Neurology

## 2019-05-22 VITALS — BP 137/84 | HR 73 | Temp 96.8°F | Ht 63.0 in | Wt 151.0 lb

## 2019-05-22 DIAGNOSIS — G20 Parkinson's disease: Secondary | ICD-10-CM

## 2019-05-22 MED ORDER — CARBIDOPA-LEVODOPA ER 25-100 MG PO TBCR: 1.0000 | EXTENDED_RELEASE_TABLET | Freq: Two times a day (BID) | ORAL | 1 refills | Status: DC

## 2019-05-22 NOTE — Progress Notes (Signed)
Reason for visit: Parkinson's disease  Chelsea [place] is an 68 y.o. female  History of present illness:  Chelsea [place] is a 68 year old left-handed white female with a history of Parkinson's disease.  The patient was increased on Sinemet taking the 25/100 mg tablets 3 times daily on her last visit in June 2020.  The patient has had some nausea in the morning with this dose increase, she has had worsening of her dyskinesias, she remains on amantadine taking 100 mg 3 times daily.  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  The patient does have occasional tremors, she has now developed a REM sleep disorder, she will talk and move at night while sleeping, she has a good energy level during the day, she is not waking up frequently.  She may stumble on occasion, she has had 1 fall since last seen.  She denies issues with choking with swallowing.  She is somewhat unsteady on steps or stairs, she has to hold onto the railing.  She returns to this office for an evaluation.  Past Medical History:  Diagnosis Date  . History of seizures   . History of tremor   . Parkinson disease ([place]) 05/15/2015    Past Surgical History:  Procedure Laterality Date  . FOOT SURGERY Bilateral   . MOUTH SURGERY     X2  . TONSILLECTOMY      Family History  Problem Relation Age of Onset  . Diabetes Father   . Diabetes Sister   . Diabetes Brother   . Parkinson's disease Mother   . Parkinson's disease Maternal Grandfather     Social history:  reports that she has never smoked. She has never used smokeless tobacco. She reports current alcohol use. She reports that she does not use drugs.   No Known Allergies  Medications:  Prior to Admission medications   Medication Sig Start Date End Date Taking? Authorizing Provider  amantadine (SYMMETREL) 100 MG capsule Take 1 capsule (100 mg total) by mouth 3 (three) times daily. 03/30/19  Yes Kathrynn Ducking, MD  carbidopa-levodopa (SINEMET IR) 25-100 MG tablet Take 1  tablet by mouth 3 (three) times daily. 12/19/18  Yes Kathrynn Ducking, MD  propranolol (INDERAL) 20 MG tablet Take 1 tablet (20 mg total) by mouth 2 (two) times daily. 05/20/18  Yes Kathrynn Ducking, MD  selegiline (ELDEPRYL) 5 MG tablet 1 tablet in the morning and at noon 12/19/18  Yes Kathrynn Ducking, MD    ROS:  Out of a complete 14 system review of symptoms, the patient complains only of the following symptoms, and all other reviewed systems are negative.  Nausea Dyskinesias Balance problems  Blood pressure 137/84, pulse 73, temperature (!) 96.8 F (36 C), temperature source Temporal, height 5\' 3"  (1.6 m), weight 151 lb (68.5 kg).  Physical Exam  General: The patient is alert and cooperative at the time of the examination.  Skin: No significant peripheral edema is noted.   Neurologic Exam  Mental status: The patient is alert and oriented x 3 at the time of the examination. The patient has apparent normal recent and remote memory, with an apparently normal attention span and concentration ability.   Cranial nerves: Facial symmetry is present. Speech is normal, no aphasia or dysarthria is noted. Extraocular movements are full. Visual fields are full.  Masking the face is seen.  The patient is having dyskinesias involving the head and neck and shoulders  Motor: The patient has good strength in  all 4 extremities.  Sensory examination: Soft touch sensation is symmetric on the face, arms, and legs.  Coordination: The patient has good finger-nose-finger and heel-to-shin bilaterally.  Gait and station: The patient has a normal gait. Tandem gait is slightly unsteady. Romberg is negative. No drift is seen.  Reflexes: Deep tendon reflexes are symmetric.   Assessment/Plan:  1.  Parkinson's disease  2.  REM sleep disorder  The patient is having dyskinesias that have worsened on the dose increase of the Sinemet.  She is now having nausea in the morning primarily.  We will cut back  the dose of Sinemet and convert to a CR tablet taking the 25/100 mg Sinemet CR 1 in the morning and 1 in the evening.  The patient will take the medication after eating.  The patient is having a REM sleep behavior disorder with the Parkinson's disease, if this worsens we may add clonazepam at night.  The patient is having some neck discomfort associated with the dyskinesias.  Hopefully cutting back on the Sinemet will help this.  She will follow-up in 6 months.  Jill Alexanders MD 05/22/2019 9:51 AM  [facility] [address] [place], [place] [address]  Phone [phone] Fax [phone]

## 2019-05-22 NOTE — Patient Instructions (Signed)
Stop the Sinemet 25/100 mg tablets, and we will convert to the Sinemet CR 25/100 mg tablets taking 1 tablet twice a day.

## 2019-06-19 DIAGNOSIS — Q799 Congenital malformation of musculoskeletal system, unspecified: Secondary | ICD-10-CM | POA: Diagnosis not present

## 2019-06-23 ENCOUNTER — Other Ambulatory Visit: Payer: Self-pay | Admitting: Neurology

## 2019-07-03 IMAGING — CR DG CERVICAL SPINE COMPLETE 4+V
8 series · 8 of 8 positions shown · non-contrast
Comparison: Cervical spine radiographs 01/19/2005 and earlier.

CLINICAL DATA: 66-year-old female with 1 month of left side neck
pain. No known injury.

EXAM:
CERVICAL SPINE - COMPLETE 4+ VIEW

[w cervical spine lat]
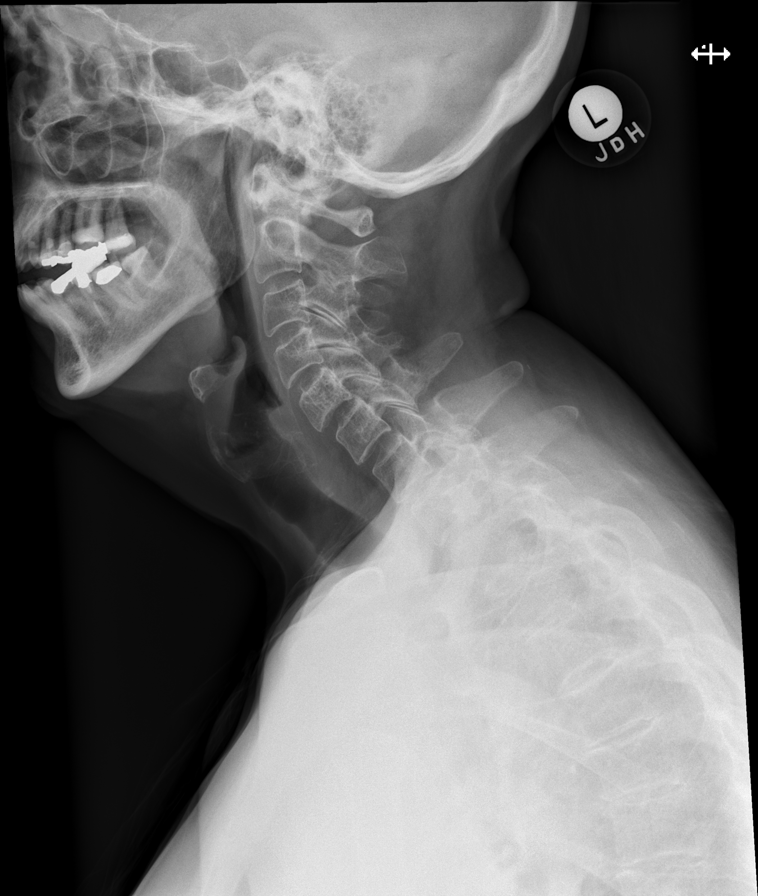

[w cervical spine ap_obl (1 of 2)]
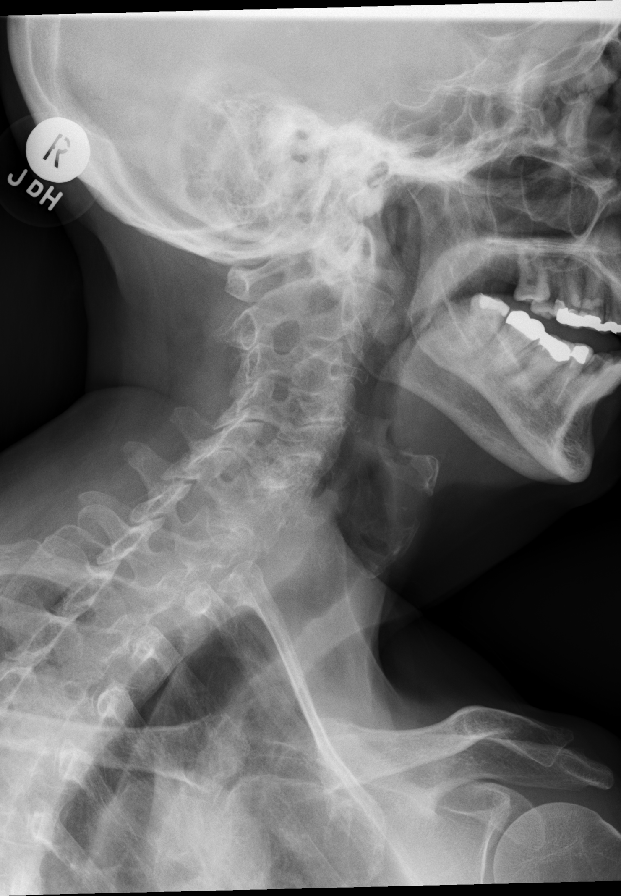

[w cervical spine ap_obl (2 of 2)]
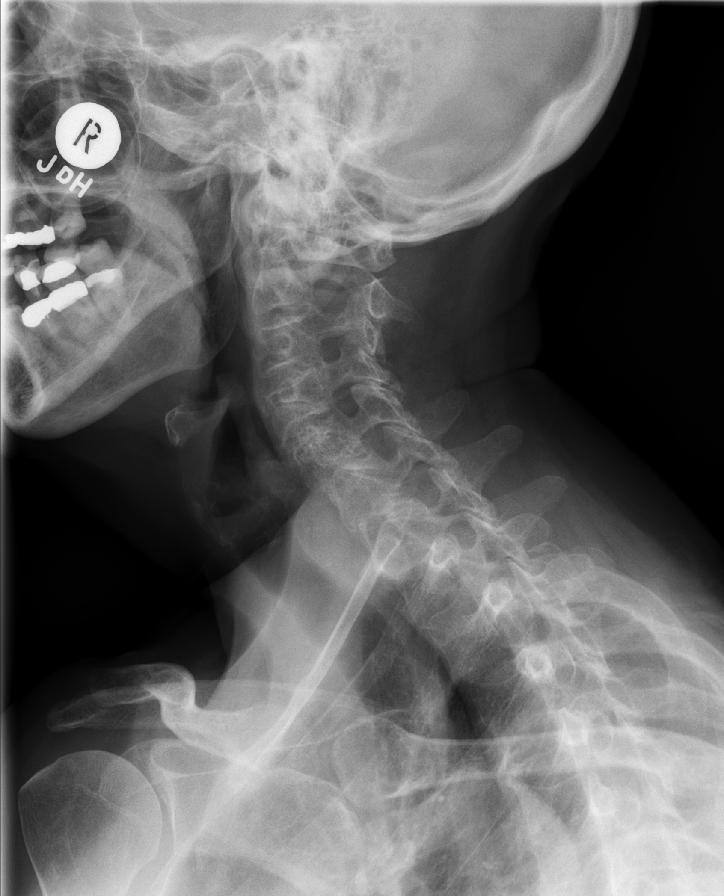

[w cervical swimmers]
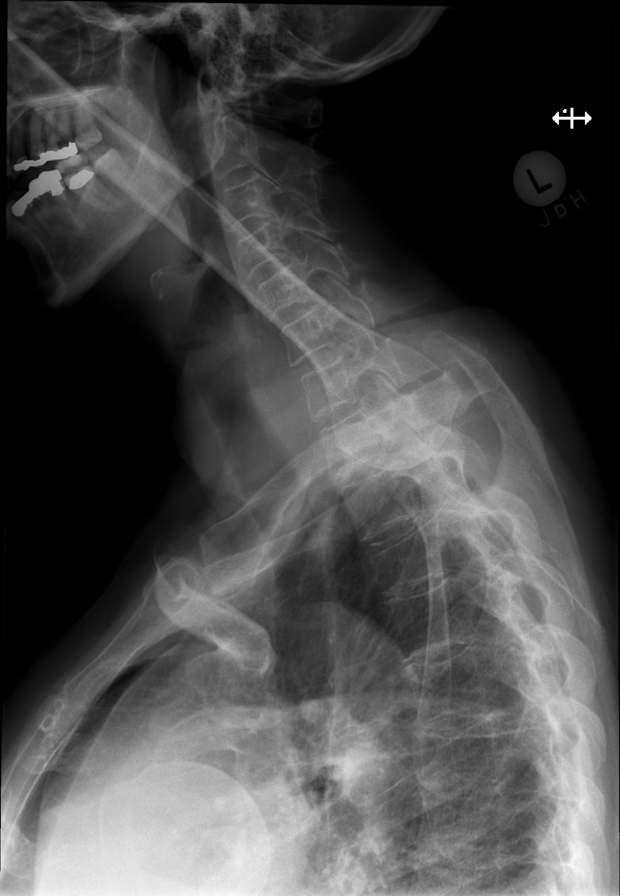

[w cervical spine ap]
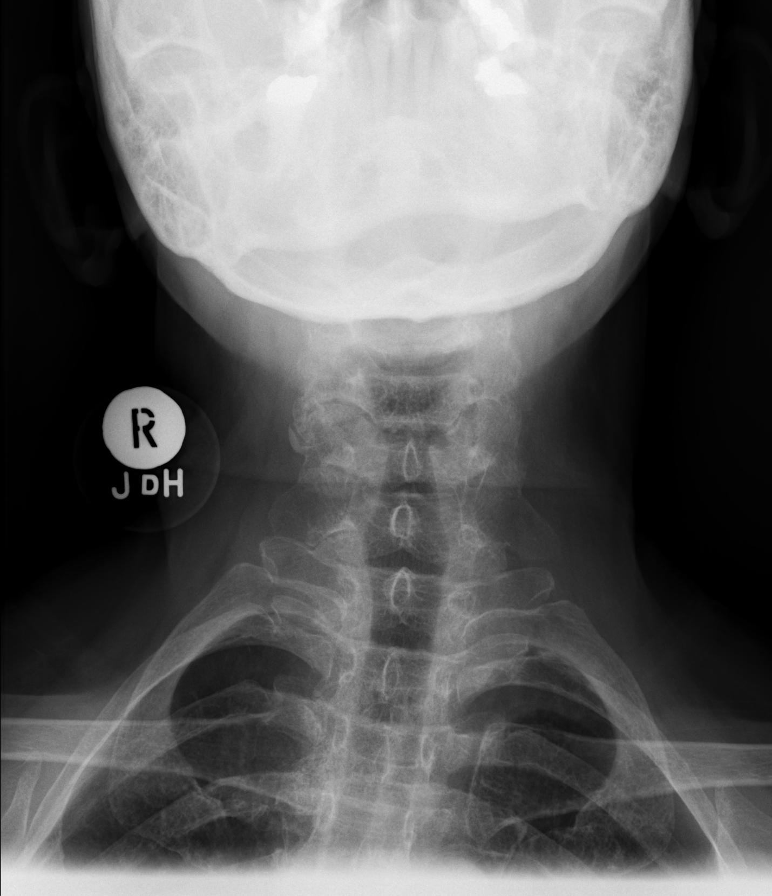

[w cervical spine odontoid (1 of 3)]
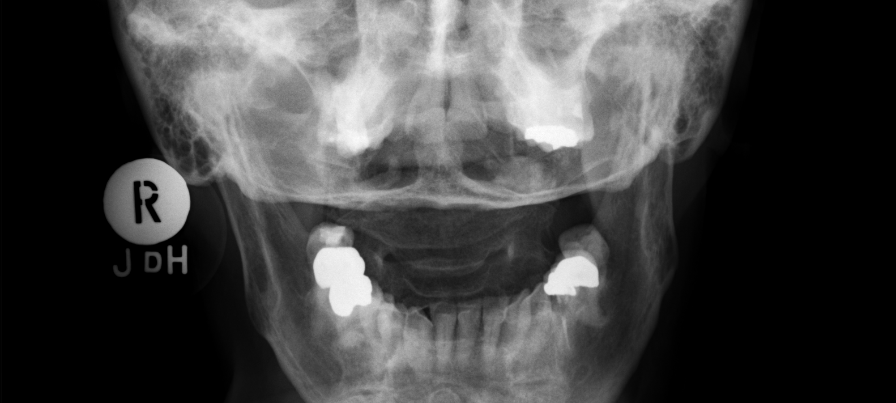

[w cervical spine odontoid (2 of 3)]
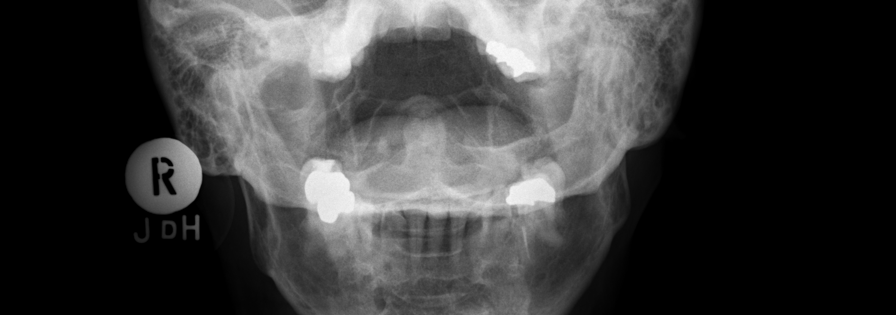

[w cervical spine odontoid (3 of 3)]
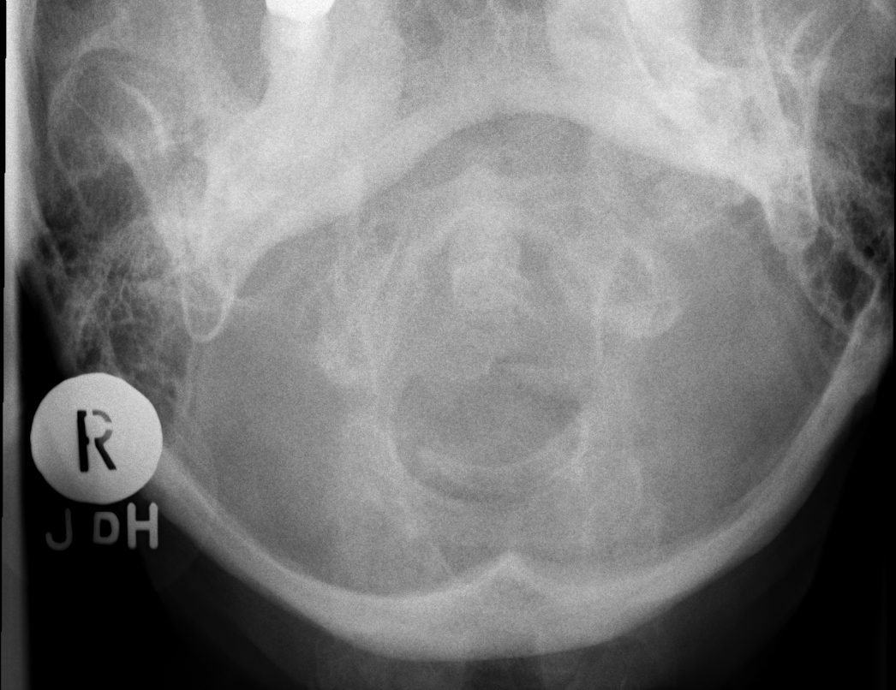

[8 of 8 positions shown; findings below may reference images not displayed]

FINDINGS: Preserved cervical lordosis. Normal prevertebral soft tissue
contour. Cervicothoracic junction alignment is within normal limits.
Bilateral posterior element alignment is within normal limits.
Relatively preserved disc spaces. There is bilateral cervical facet
hypertrophy, which appears maximal in perhaps moderate to severe on
the right at C5-C6. C1-C2 alignment appears to be normal. Partially
visible dextroconvex thoracic scoliosis. Otherwise negative visible
chest.
IMPRESSION: 1.  No acute osseous abnormality identified in the cervical spine.
2. Spinal degeneration appears primarily limited to facet
arthropathy which is moderate to severe on the right at C5-C6.

## 2019-08-01 ENCOUNTER — Telehealth: Payer: Self-pay | Admitting: Neurology

## 2019-08-01 MED ORDER — CARBIDOPA-LEVODOPA ER 25-100 MG PO TBCR: 1.0000 | EXTENDED_RELEASE_TABLET | Freq: Three times a day (TID) | ORAL | 1 refills | Status: DC

## 2019-08-01 NOTE — Telephone Encounter (Signed)
Pt is asking for a call from RN to discuss her Carbidopa-Levodopa ER (SINEMET CR) 25-100 MG tablet controlled release.  Pt states since she has changed/decreased her dosage she is now having difficulty walking, she'd like to know if that is as a result of the change in her Carbidopa-Levodopa ER (SINEMET CR) 25-100 MG tablet controlled release please call

## 2019-08-01 NOTE — Addendum Note (Signed)
Addended by: Kathrynn Ducking on: 08/01/2019 02:29 PM   Modules accepted: Orders

## 2019-08-01 NOTE — Telephone Encounter (Signed)
I called the patient.  The patient has had increasing problems with freezing when she first initiates ambulation, this occurred shortly after the Sinemet was cut back and switched to CR.  Switching to CR it has helped the nausea issue.  We will try to go back up to 25/100 mg CR Sinemet taking 1 tablet 3 times daily.  She will contact me if the nausea worsens.  The dyskinesias are better as well on the lower dose.

## 2019-09-26 ENCOUNTER — Other Ambulatory Visit: Payer: Self-pay | Admitting: Neurology

## 2019-11-07 DIAGNOSIS — R21 Rash and other nonspecific skin eruption: Secondary | ICD-10-CM | POA: Diagnosis not present

## 2019-11-21 ENCOUNTER — Encounter: Payer: Self-pay | Admitting: Neurology

## 2019-11-21 ENCOUNTER — Ambulatory Visit: Payer: PPO | Admitting: Neurology

## 2019-11-21 ENCOUNTER — Other Ambulatory Visit: Payer: Self-pay

## 2019-11-21 VITALS — BP 139/79 | HR 68 | Temp 97.4°F | Ht 64.0 in | Wt 149.0 lb

## 2019-11-21 DIAGNOSIS — G20 Parkinson's disease: Secondary | ICD-10-CM

## 2019-11-21 DIAGNOSIS — Z5181 Encounter for therapeutic drug level monitoring: Secondary | ICD-10-CM | POA: Diagnosis not present

## 2019-11-21 MED ORDER — MELOXICAM 7.5 MG PO TABS: 7.5000 mg | ORAL_TABLET | Freq: Every day | ORAL | 2 refills | Status: DC

## 2019-11-21 MED ORDER — PRAMIPEXOLE DIHYDROCHLORIDE 0.125 MG PO TABS: ORAL_TABLET | ORAL | 1 refills | Status: DC

## 2019-11-21 NOTE — Progress Notes (Signed)
Reason for visit: Parkinson's disease  Chelsea [place] is an 69 y.o. female  History of present illness:  Chelsea [place] is a 69 year old left-handed white female with a history of Parkinson's disease.  The patient has had some problems with nausea on the Sinemet, she has tolerated the CR tablets better, she currently is on 25/100 mg tablets 3 times daily.  She takes selegiline as well 5 mg twice daily.  She is on amantadine.  She has had dyskinesias off and on involving the head and neck, she has cervical arthritis at the C5-6 level particularly on the right and she has significant neck pain in the morning.  The neck pain is more significant on the right side.  She does not have pain going down the arms.  When the dyskinesias become more severe, the neck pain worsens.  The patient has had 1 fall 1 month ago, she did not sustain significant injury.  She claims that she is sleeping fairly well, she is not talking as much in her sleep.  She returns to the office today for an evaluation.  She is still having some issues with freezing on a frequent basis, particularly when she tries to start walking.  Past Medical History:  Diagnosis Date  . History of seizures   . History of tremor   . Parkinson disease ([place]) 05/15/2015    Past Surgical History:  Procedure Laterality Date  . FOOT SURGERY Bilateral   . MOUTH SURGERY     X2  . TONSILLECTOMY      Family History  Problem Relation Age of Onset  . Diabetes Father   . Diabetes Sister   . Diabetes Brother   . Parkinson's disease Mother   . Parkinson's disease Maternal Grandfather     Social history:  reports that she has never smoked. She has never used smokeless tobacco. She reports current alcohol use. She reports that she does not use drugs.   No Known Allergies  Medications:  Prior to Admission medications   Medication Sig Start Date End Date Taking? Authorizing Provider  amantadine (SYMMETREL) 100 MG capsule TAKE 1 CAPSULE BY  MOUTH THREE TIMES DAILY 09/26/19  Yes Kathrynn Ducking, MD  Carbidopa-Levodopa ER (SINEMET CR) 25-100 MG tablet controlled release Take 1 tablet by mouth 3 (three) times daily. 08/01/19  Yes Kathrynn Ducking, MD  propranolol (INDERAL) 20 MG tablet Take 1 tablet by mouth twice daily 09/26/19  Yes Kathrynn Ducking, MD  selegiline (ELDEPRYL) 5 MG tablet 1 tablet in the morning and at noon 12/19/18  Yes Kathrynn Ducking, MD    ROS:  Out of a complete 14 system review of symptoms, the patient complains only of the following symptoms, and all other reviewed systems are negative.  Neck pain Walking difficulty  Blood pressure 139/79, pulse 68, temperature (!) 97.4 F (36.3 C), height 5\' 4"  (1.626 m), weight 149 lb (67.6 kg).  Physical Exam  General: The patient is alert and cooperative at the time of the examination.  Skin: No significant peripheral edema is noted.   Neurologic Exam  Mental status: The patient is alert and oriented x 3 at the time of the examination. The patient has apparent normal recent and remote memory, with an apparently normal attention span and concentration ability.   Cranial nerves: Facial symmetry is present. Speech is normal, no aphasia or dysarthria is noted. Extraocular movements are full. Visual fields are full.  Masking of the face is seen.  Motor: The patient has good strength in all 4 extremities.  Sensory examination: Soft touch sensation is symmetric on the face, arms, and legs.  Coordination: The patient has good finger-nose-finger and heel-to-shin bilaterally.  Gait and station: The patient is able to stand from seated position with arms crossed.  Once up, the patient does have some slight hesitancy with initiation of ambulation, once she starts walking, she has fairly symmetric arm swing and good stride and good turns.  Tandem gait is minimally unsteady.  Romberg is negative.  Reflexes: Deep tendon reflexes are symmetric.   Assessment/Plan:  1.   Parkinson's disease  2.  Cervical arthritis, chronic neck pain  The patient will be sent for blood work today to check renal function, she will be placed on low-dose Mobic 7.5 mg daily for the neck pain.  With the freezing episodes, she will be placed on Mirapex taken 0.125 mg 3 times daily for 3 weeks then go to 0.25 mg 3 times daily.  She will call if she cannot tolerate the medication.  She will be followed up in 5 months.  She may try CBD oil for the arthritis and to help her sleep better.   Jill Alexanders MD 11/21/2019 10:14 AM  [facility] [address] [place], [place] [address]  Phone [phone] Fax [phone]

## 2019-11-21 NOTE — Patient Instructions (Signed)
We will start Mobic 7.5 mg tablets for the neck arthritis.  Start mirapex 0.125 mg tablets three times a day for the parkinson's disease.

## 2019-11-22 LAB — COMPREHENSIVE METABOLIC PANEL
ALT: 5 IU/L (ref 0–32)
AST: 14 IU/L (ref 0–40)
Albumin/Globulin Ratio: 1.9 (ref 1.2–2.2)
Albumin: 4.4 g/dL (ref 3.8–4.8)
Alkaline Phosphatase: 120 IU/L — ABNORMAL HIGH (ref 39–117)
BUN/Creatinine Ratio: 19 (ref 12–28)
BUN: 15 mg/dL (ref 8–27)
Bilirubin Total: 0.4 mg/dL (ref 0.0–1.2)
CO2: 25 mmol/L (ref 20–29)
Calcium: 9.8 mg/dL (ref 8.7–10.3)
Chloride: 105 mmol/L (ref 96–106)
Creatinine, Ser: 0.8 mg/dL (ref 0.57–1.00)
GFR calc Af Amer: 86 mL/min/{1.73_m2} (ref >59)
GFR calc non Af Amer: 75 mL/min/{1.73_m2} (ref >59)
Globulin, Total: 2.3 g/dL (ref 1.5–4.5)
Glucose: 91 mg/dL (ref 65–99)
Potassium: 4.8 mmol/L (ref 3.5–5.2)
Sodium: 143 mmol/L (ref 134–144)
Total Protein: 6.7 g/dL (ref 6.0–8.5)

## 2019-12-18 ENCOUNTER — Other Ambulatory Visit: Payer: Self-pay | Admitting: Neurology

## 2020-01-11 DIAGNOSIS — L817 Pigmented purpuric dermatosis: Secondary | ICD-10-CM | POA: Diagnosis not present

## 2020-01-19 ENCOUNTER — Telehealth: Payer: Self-pay | Admitting: Neurology

## 2020-01-23 ENCOUNTER — Telehealth: Payer: Self-pay | Admitting: Neurology

## 2020-01-23 ENCOUNTER — Other Ambulatory Visit: Payer: Self-pay

## 2020-01-23 MED ORDER — SELEGILINE HCL 5 MG PO TABS: ORAL_TABLET | ORAL | 3 refills | Status: DC

## 2020-01-23 NOTE — Telephone Encounter (Signed)
Pt has called to inform that she only has enough selegiline (ELDEPRYL) 5 MG tablet to last her to Friday, pt still needs a refill to [facility]

## 2020-01-24 ENCOUNTER — Other Ambulatory Visit: Payer: Self-pay | Admitting: Neurology

## 2020-01-24 MED ORDER — CARBIDOPA-LEVODOPA ER 25-100 MG PO TBCR: 1.0000 | EXTENDED_RELEASE_TABLET | Freq: Three times a day (TID) | ORAL | 1 refills | Status: DC

## 2020-01-24 NOTE — Telephone Encounter (Signed)
Spoke with pt and let her know the refill of Selegiline was sent to [facility] yesterday afternoon, 3 month supply with enough refills to last a year. She wll call the pharmacy. Pt also stated she is almost out of Carbidopa-Levodopa Rx. Refill sent. Pending appt 05/07/20 @ 12:00 PM.

## 2020-01-24 NOTE — Addendum Note (Signed)
Addended by: Gildardo Griffes on: 01/24/2020 11:38 AM   Modules accepted: Orders

## 2020-01-24 NOTE — Telephone Encounter (Signed)
Pt has called back with concern that she has not heard back in response to her running out of her medication.

## 2020-01-25 NOTE — Telephone Encounter (Signed)
Error

## 2020-03-20 DIAGNOSIS — M542 Cervicalgia: Secondary | ICD-10-CM | POA: Diagnosis not present

## 2020-03-28 DIAGNOSIS — M542 Cervicalgia: Secondary | ICD-10-CM | POA: Diagnosis not present

## 2020-04-01 DIAGNOSIS — M542 Cervicalgia: Secondary | ICD-10-CM | POA: Diagnosis not present

## 2020-04-11 DIAGNOSIS — M542 Cervicalgia: Secondary | ICD-10-CM | POA: Diagnosis not present

## 2020-04-17 DIAGNOSIS — M542 Cervicalgia: Secondary | ICD-10-CM | POA: Diagnosis not present

## 2020-04-23 DIAGNOSIS — M542 Cervicalgia: Secondary | ICD-10-CM | POA: Diagnosis not present

## 2020-04-30 DIAGNOSIS — M542 Cervicalgia: Secondary | ICD-10-CM | POA: Diagnosis not present

## 2020-05-07 ENCOUNTER — Ambulatory Visit: Payer: PPO | Admitting: Neurology

## 2020-05-07 ENCOUNTER — Encounter: Payer: Self-pay | Admitting: Neurology

## 2020-05-07 VITALS — BP 113/81 | HR 71 | Ht 63.0 in | Wt 144.5 lb

## 2020-05-07 DIAGNOSIS — G20 Parkinson's disease: Secondary | ICD-10-CM | POA: Diagnosis not present

## 2020-05-07 NOTE — Progress Notes (Signed)
Reason for visit: Parkinson's disease  Chelsea Conley is an 69 y.o. female  History of present illness:  Chelsea Conley is a 70 year old left-handed white female with a history of Parkinson's disease.  She currently is on Sinemet 25/100 mg CR tablets, she takes 1 tablet 3 times daily.  She is on Symmetrel 100 mg 3 times daily for dyskinesias but the dyskinesias remain prominent.  She takes selegiline.  She was given a prescription for Mirapex but never took the drug, she did try CBD oil which helped her sleep but really did not help her Parkinson's symptoms.  She continues to have frequent freezing episodes, she has not had any falls.  She denies significant issues with swallowing.  She is trying to stay active.  She may be interested in rock steady boxing.  She returns to the office today for an evaluation.  She continues to have some neck pain is exacerbated dyskinesias.  Past Medical History:  Diagnosis Date  . History of seizures   . History of tremor   . Parkinson disease ([place]) 05/15/2015    Past Surgical History:  Procedure Laterality Date  . FOOT SURGERY Bilateral   . MOUTH SURGERY     X2  . TONSILLECTOMY      Family History  Problem Relation Age of Onset  . Diabetes Father   . Diabetes Sister   . Diabetes Brother   . Parkinson's disease Mother   . Parkinson's disease Maternal Grandfather     Social history:  reports that she has never smoked. She has never used smokeless tobacco. She reports current alcohol use. She reports that she does not use drugs.   No Known Allergies  Medications:  Prior to Admission medications   Medication Sig Start Date End Date Taking? Authorizing Provider  amantadine (SYMMETREL) 100 MG capsule TAKE 1 CAPSULE BY MOUTH THREE TIMES DAILY 12/18/19  Yes Kathrynn Ducking, MD  Carbidopa-Levodopa ER (SINEMET CR) 25-100 MG tablet controlled release Take 1 tablet by mouth 3 (three) times daily. 01/24/20  Yes Kathrynn Ducking, MD  propranolol  (INDERAL) 20 MG tablet Take 1 tablet by mouth twice daily 12/18/19  Yes Kathrynn Ducking, MD  selegiline (ELDEPRYL) 5 MG tablet 1 tablet in the morning and at noon 01/23/20  Yes Kathrynn Ducking, MD  meloxicam (MOBIC) 7.5 MG tablet Take 1 tablet (7.5 mg total) by mouth daily. 11/21/19   Kathrynn Ducking, MD  pramipexole (MIRAPEX) 0.125 MG tablet One tablet three times a day for 3 weeks, then take 2 tablets three times a day 11/21/19   Kathrynn Ducking, MD    ROS:  Out of a complete 14 system review of symptoms, the patient complains only of the following symptoms, and all ot socks her reviewed systems are negative.  Tremors Difficulty walking Neck pain  Blood pressure 113/81, pulse 71, height 5\' 3"  (1.6 m), weight 144 lb 8 oz (65.5 kg).  Physical Exam  General: The patient is alert and cooperative at the time of the examination.  Skin: No significant peripheral edema is noted.   Neurologic Exam  Mental status: The patient is alert and oriented x 3 at the time of the examination. The patient has apparent normal recent and remote memory, with an apparently normal attention span and concentration ability.   Cranial nerves: Facial symmetry is present. Speech is normal, no aphasia or dysarthria is noted. Extraocular movements are full. Visual fields are full.  Motor: The patient has  good strength in all 4 extremities.  Sensory examination: Soft touch sensation is symmetric on the face, arms, and legs.  Coordination: The patient has good finger-nose-finger and heel-to-shin bilaterally.  Prominent dyskinesias of the upper body and neck are noted.  Gait and station: The patient is able to arise from a seated position with arms crossed.  When walking, she demonstrates fairly symmetric arm swing, turns are somewhat hesitant.  She does not stumble.. Tandem gait is slightly unsteady. Romberg is negative. No drift is seen.  Reflexes: Deep tendon reflexes are  symmetric.   Assessment/Plan:  1.  Parkinson's disease  The patient has not been placed on a very high dose of Sinemet but still has significant problems with dyskinesias, unresponsive to amantadine.  We will place patient on low-dose Mirapex for the freezing episodes, she may be interested in speech therapy for hypophonia, she will call me if this is the case.  She will follow-up here in 6 months.  Jill Alexanders MD 05/07/2020 12:35 PM  [facility] [address] [place], [place] [address]  Phone [phone] Fax [phone]

## 2020-05-10 DIAGNOSIS — M542 Cervicalgia: Secondary | ICD-10-CM | POA: Diagnosis not present

## 2020-05-15 DIAGNOSIS — M542 Cervicalgia: Secondary | ICD-10-CM | POA: Diagnosis not present

## 2020-05-21 DIAGNOSIS — M542 Cervicalgia: Secondary | ICD-10-CM | POA: Diagnosis not present

## 2020-05-30 DIAGNOSIS — M542 Cervicalgia: Secondary | ICD-10-CM | POA: Diagnosis not present

## 2020-06-03 DIAGNOSIS — M542 Cervicalgia: Secondary | ICD-10-CM | POA: Diagnosis not present

## 2020-06-13 DIAGNOSIS — M542 Cervicalgia: Secondary | ICD-10-CM | POA: Diagnosis not present

## 2020-06-17 ENCOUNTER — Telehealth: Payer: Self-pay | Admitting: Neurology

## 2020-06-17 MED ORDER — PROPRANOLOL HCL 20 MG PO TABS: 20.0000 mg | ORAL_TABLET | Freq: Two times a day (BID) | ORAL | 0 refills | Status: DC

## 2020-06-17 MED ORDER — AMANTADINE HCL 100 MG PO CAPS: 100.0000 mg | ORAL_CAPSULE | Freq: Three times a day (TID) | ORAL | 0 refills | Status: DC

## 2020-06-17 NOTE — Addendum Note (Signed)
Addended by: Gildardo Griffes on: 06/17/2020 04:34 PM   Modules accepted: Orders

## 2020-06-17 NOTE — Telephone Encounter (Signed)
Pt. needs PA's for propranolol (INDERAL) 20 MG tablet & amantadine (SYMMETREL) 100 MG capsule. She asks for [facility] to be taken off.  [id number]

## 2020-06-17 NOTE — Telephone Encounter (Signed)
[phone]. I don't see any requests for PAs. Pharmacy will send to [place] if needed.

## 2020-06-21 DIAGNOSIS — M542 Cervicalgia: Secondary | ICD-10-CM | POA: Diagnosis not present

## 2020-07-18 ENCOUNTER — Other Ambulatory Visit: Payer: Self-pay | Admitting: Emergency Medicine

## 2020-07-18 MED ORDER — CARBIDOPA-LEVODOPA ER 25-100 MG PO TBCR: 1.0000 | EXTENDED_RELEASE_TABLET | Freq: Three times a day (TID) | ORAL | 1 refills | Status: DC

## 2020-07-19 DIAGNOSIS — M542 Cervicalgia: Secondary | ICD-10-CM | POA: Diagnosis not present

## 2020-08-14 DIAGNOSIS — M542 Cervicalgia: Secondary | ICD-10-CM | POA: Diagnosis not present

## 2020-09-10 ENCOUNTER — Other Ambulatory Visit: Payer: Self-pay | Admitting: Neurology

## 2020-09-13 ENCOUNTER — Other Ambulatory Visit: Payer: Self-pay | Admitting: Neurology

## 2020-09-30 DIAGNOSIS — M542 Cervicalgia: Secondary | ICD-10-CM | POA: Diagnosis not present

## 2020-10-07 DIAGNOSIS — G20 Parkinson's disease: Secondary | ICD-10-CM | POA: Diagnosis not present

## 2020-10-07 DIAGNOSIS — H04123 Dry eye syndrome of bilateral lacrimal glands: Secondary | ICD-10-CM | POA: Diagnosis not present

## 2020-10-07 DIAGNOSIS — H2513 Age-related nuclear cataract, bilateral: Secondary | ICD-10-CM | POA: Diagnosis not present

## 2020-10-16 DIAGNOSIS — H6122 Impacted cerumen, left ear: Secondary | ICD-10-CM | POA: Diagnosis not present

## 2020-10-28 DIAGNOSIS — M542 Cervicalgia: Secondary | ICD-10-CM | POA: Diagnosis not present

## 2020-11-06 ENCOUNTER — Telehealth: Payer: Self-pay | Admitting: Neurology

## 2020-11-06 ENCOUNTER — Ambulatory Visit: Payer: PPO | Admitting: Neurology

## 2020-11-06 ENCOUNTER — Encounter: Payer: Self-pay | Admitting: Neurology

## 2020-11-06 VITALS — BP 155/77 | HR 72 | Ht 63.0 in | Wt 139.4 lb

## 2020-11-06 DIAGNOSIS — M542 Cervicalgia: Secondary | ICD-10-CM

## 2020-11-06 MED ORDER — PROPRANOLOL HCL 20 MG PO TABS: 20.0000 mg | ORAL_TABLET | Freq: Two times a day (BID) | ORAL | 3 refills | Status: DC

## 2020-11-06 NOTE — Progress Notes (Signed)
Reason for visit: Parkinson's disease  Chelsea Conley is an 70 y.o. female  History of present illness:  Chelsea Conley is a 70 year old left-handed white female with a history of Parkinson's disease.  The patient is on low-dose Sinemet taking 25/100 mg tablets 3 times daily but she has developed significant dyskinesias on the medication.  The patient has a lot of neck discomfort, this has been a problem for many years.  The patient is seeing a physical therapist through integrative therapies which helped some with this.  The patient is now in [employer] which seems to help as well.  The patient still has some hesitancy with initiation of ambulation.  She cannot take the Mobic and Mirapex because it upset her stomach.  The patient returns to the office today for an evaluation.  She is on amantadine but this does not completely control her dyskinesias.  Past Medical History:  Diagnosis Date  . History of seizures   . History of tremor   . Parkinson disease ([place]) 05/15/2015    Past Surgical History:  Procedure Laterality Date  . FOOT SURGERY Bilateral   . MOUTH SURGERY     X2  . TONSILLECTOMY      Family History  Problem Relation Age of Onset  . Diabetes Father   . Diabetes Sister   . Diabetes Brother   . Parkinson's disease Mother   . Parkinson's disease Maternal Grandfather     Social history:  reports that she has never smoked. She has never used smokeless tobacco. She reports current alcohol use. She reports that she does not use drugs.   No Known Allergies  Medications:  Prior to Admission medications   Medication Sig Start Date End Date Taking? Authorizing Provider  amantadine (SYMMETREL) 100 MG capsule TAKE 1 CAPSULE BY MOUTH THREE TIMES A DAY 09/16/20  Yes Kathrynn Ducking, MD  Carbidopa-Levodopa ER (SINEMET CR) 25-100 MG tablet controlled release Take 1 tablet by mouth 3 (three) times daily. 07/18/20  Yes Kathrynn Ducking, MD  propranolol (INDERAL) 20 MG  tablet TAKE 1 TABLET BY MOUTH TWICE A DAY 09/10/20  Yes Kathrynn Ducking, MD  selegiline (ELDEPRYL) 5 MG tablet 1 tablet in the morning and at noon 01/23/20  Yes Kathrynn Ducking, MD  meloxicam (MOBIC) 7.5 MG tablet Take 1 tablet (7.5 mg total) by mouth daily. 11/21/19   Kathrynn Ducking, MD  pramipexole (MIRAPEX) 0.125 MG tablet One tablet three times a day for 3 weeks, then take 2 tablets three times a day 11/21/19   Kathrynn Ducking, MD    ROS:  Out of a complete 14 system review of symptoms, the patient complains only of the following symptoms, and all other reviewed systems are negative.  Neck pain Walking difficulty  Blood pressure (!) 155/77, pulse 72, height 5\' 3"  (1.6 m), weight 139 lb 6.4 oz (63.2 kg).  Physical Exam  General: The patient is alert and cooperative at the time of the examination.  Skin: No significant peripheral edema is noted.   Neurologic Exam  Mental status: The patient is alert and oriented x 3 at the time of the examination. The patient has apparent normal recent and remote memory, with an apparently normal attention span and concentration ability.   Cranial nerves: Facial symmetry is present. Speech is normal, no aphasia or dysarthria is noted. Extraocular movements are full. Visual fields are full.  Mild masking of the face is seen.  Motor: The patient  has good strength in all 4 extremities.  Sensory examination: Soft touch sensation is symmetric on the face, arms, and legs.  Coordination: The patient has good finger-nose-finger and heel-to-shin bilaterally.  Dyskinesia movements are seen involving the head and neck and body.  Gait and station: The patient has a normal gait, she demonstrates good arm swing bilaterally.  She is able to get up from a seated position with arms crossed. Tandem gait is slightly unsteady. Romberg is negative. No drift is seen.  Reflexes: Deep tendon reflexes are symmetric.   Assessment/Plan:  1.  Parkinson's  disease  2.  Chronic neck discomfort  The patient will be sent for MRI of the cervical spine.  We may consider an epidural steroid injection in the future depending upon the results of the above.  She is having a lot of discomfort with the neck that has not improved with medications or physical therapy.  She will follow up here in 5 months.  Jill Alexanders MD 11/06/2020 12:18 PM  [facility] [address] [place], [place] [address]  Phone [phone] Fax [phone]

## 2020-11-06 NOTE — Telephone Encounter (Signed)
health team order sent to GI. No auth they will reach out to the patient to schedule.  

## 2020-11-11 ENCOUNTER — Telehealth: Payer: Self-pay | Admitting: Neurology

## 2020-11-11 NOTE — Telephone Encounter (Signed)
Pt is asking for a call to discuss who her next f/u needs to be with, please call.

## 2020-11-12 NOTE — Telephone Encounter (Signed)
Pt scheduled for 10/10 at 1:15 pm with Dr. Jannifer Franklin.

## 2020-11-27 ENCOUNTER — Ambulatory Visit
Admission: RE | Admit: 2020-11-27 | Discharge: 2020-11-27 | Disposition: A | Discharge status: Home / Self Care | Payer: PPO | Source: Ambulatory Visit | Attending: Neurology | Admitting: Neurology

## 2020-11-27 ENCOUNTER — Other Ambulatory Visit: Payer: Self-pay

## 2020-11-27 DIAGNOSIS — M542 Cervicalgia: Secondary | ICD-10-CM | POA: Diagnosis not present

## 2020-11-29 ENCOUNTER — Telehealth: Payer: Self-pay | Admitting: Neurology

## 2020-11-29 MED ORDER — TIZANIDINE HCL 2 MG PO TABS: 2.0000 mg | ORAL_TABLET | Freq: Three times a day (TID) | ORAL | 2 refills | Status: DC

## 2020-11-29 NOTE — Telephone Encounter (Signed)
I called the patient.  MRI of the cervical spine is relatively unremarkable, her neck pain is likely related to the dyskinesias.  The patient has been through integrative therapies, she does muscle stretches on a regular basis.  I will give her a trial on tizanidine, she will call for any dose adjustments.  We may try gabapentin in the future.   MRI cervical 11/28/20:  IMPRESSION:   Unremarkable MRI cervical spine without contrast. No spinal stenosis or foraminal narrowing.

## 2020-12-02 DIAGNOSIS — Z6825 Body mass index (BMI) 25.0-25.9, adult: Secondary | ICD-10-CM | POA: Diagnosis not present

## 2020-12-02 DIAGNOSIS — Z124 Encounter for screening for malignant neoplasm of cervix: Secondary | ICD-10-CM | POA: Diagnosis not present

## 2020-12-02 DIAGNOSIS — N958 Other specified menopausal and perimenopausal disorders: Secondary | ICD-10-CM | POA: Diagnosis not present

## 2020-12-02 DIAGNOSIS — N3945 Continuous leakage: Secondary | ICD-10-CM | POA: Diagnosis not present

## 2020-12-02 DIAGNOSIS — Z1231 Encounter for screening mammogram for malignant neoplasm of breast: Secondary | ICD-10-CM | POA: Diagnosis not present

## 2020-12-02 DIAGNOSIS — M816 Localized osteoporosis [Lequesne]: Secondary | ICD-10-CM | POA: Diagnosis not present

## 2020-12-30 DIAGNOSIS — G20 Parkinson's disease: Secondary | ICD-10-CM | POA: Diagnosis not present

## 2021-01-02 ENCOUNTER — Encounter: Payer: Self-pay | Admitting: Neurology

## 2021-01-13 ENCOUNTER — Other Ambulatory Visit: Payer: Self-pay | Admitting: Neurology

## 2021-01-28 NOTE — Progress Notes (Signed)
Assessment/Plan:    Parkinsons Disease, sx's since at least 2015 -Patient is under dosed, and is currently on starting dose of levodopa (likely below the starting dose since she is on the CR version and it is not 100% bioavailable).  We discussed increasing the medication, versus potentially even changing to [place].  Ultimately, she is comfortable with her current dose and does not want to do that.  She will continue carbidopa/levodopa 25/100 CR, 1 tablet 3 times per day. -Patient is on selegiline, 1 tablet at 10 AM and 3 PM.  Discussed utility of this medication.  I am not sure it is providing much value to her (I generally only use this for its wakefulness properties).  However, if I do use it, I do not use it after noon, due to breaking down into amphetamine like properties.  Discussed with her that she can continue the medication, but would recommend that the last dose not be after noon -Patient currently on propranolol, 20 mg twice per day.  This was originally started for a diagnosis of essential tremor (which was later changed to Parkinson's disease).  She is not sure that it helps tremor.  Again, she does not want to change medication right now.  Discussed with her that we may need to address this in the future, as most Parkinson's patients will develop some degree of orthostasis. -Discussed genetic testing.  Her mother and maternal grandfather both had Parkinson's disease.  Discussed the value of this type of testing.  She can think about that.  2.  Parkinson's dyskinesia  -Patient to continue amantadine, 100 mg 3 times per day.  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  3.  RBD  -Overall mild and no additional medication required.  4.  Cervical dystonia  -Is the likely source of the neck pain.  She and I talked about the nature and pathophysiology.  We talked about the fact that it can be associated with Parkinson's  disease.  We talked about Botox, along with risks and benefits, including the black box warning.  Patient educational material was given.  She is nervous about the toxin.  States that she really does not like to take medication, nor does she really want another medicine in the body.  Her husband would like her to consider Botox because of the pain it has created.  She will think about it and can let us know in the future.    Subjective:   Chelsea Conley was seen today in the movement disorders clinic for neurologic consultation at the request of Carolee Rota, NP.  The consultation is for the evaluation of Parkinson's disease.  Patient previously under the care of Dr. Jannifer Franklin.  Patient last saw Dr. Jannifer Franklin November 06, 2020.  Records are reviewed that are available to me. This patient is accompanied in the office by her spouse who supplements the history.  Patient presented many years ago (first notes that I have her in 2015, but that was a follow-up visit) with left greater than right hand tremor.  She was initially diagnosed with essential tremor and placed on primidone.  She then developed a gait changes, which she attributed to the primidone and was changed to propranolol in 2016.  Her diagnosis was ultimately changed to Parkinson's disease in late 2016.  She was started on levodopa, and then Azilect was added.  Rasagiline had to be changed to selegiline in 2017.  Patient developed dyskinesia in 2019, for which she was placed on amantadine, which caused livedo reticularis.  Her levodopa dose was also reduced because of dyskinesia (she had only been on 1 3 times a day) but ultimately she needed more and she was changed to the CR version of levodopa in late 2020.  In May, 2021 she was placed on pramipexole, 0.25 mg 3 times per day for freezing episodes, but apparently never took the medication and took CBD instead, according to records.  For years, she has developed chronic neck pain.  Just in May, 2022 an  MRI of the cervical spine was completed because of it.  It was unremarkable (personally reviewed -evidence of hemangioma at C5).  Dr. Jannifer Franklin felt her neck pain was due to dyskinesia.  Current movement d/o meds: Selegeline: 5 mg bid (10am, 3pm) carbidopa/levodopa 25/100 CR tid (9-10 am, 3pm, 8pm - wakes up between 6-8am and in bed about 11:30pm) Amantadine 100 mg tid (10am/3pm, 8pm) Propranolol 20 mg bid (10am/8 pm)   Specific Symptoms:  Tremor: Yes.  , L>R hand (some in toes, none in the legs) Family hx of similar:  Yes.  , mom and maternal GF with Parkinsons Disease  Voice: lower Sleep: trouble maintaing sleep  Vivid Dreams:  Yes.    Acting out dreams:  Yes.  , but never fallen out of the bed Wet Pillows: Yes.   Postural symptoms:  Yes.    Falls?  No. But has near falls and stutter steps Bradykinesia symptoms: shuffling gait and slow movements Loss of smell:  Yes.   Loss of taste:  No. Urinary Incontinence:  No. But has some urgency Difficulty Swallowing:  Yes.  , but if careful does ok (one pill at time, small bites) Handwriting, micrographia: Yes.   Trouble with ADL's:  No.  Trouble buttoning clothing: No. Depression:  No. Memory changes:  Yes.  , trouble with names Hallucinations:  No.  visual distortions: No. N/V:  No., not now Lightheaded:  No.  Syncope: No. Diplopia:  Yes.   Dyskinesia:  Yes.  , daily to some degree and attributes neck pain to it.  Trouble turning neck to the left  Reports remote hx of seizure.  First one was 25-30 years ago.  Both arose out of sleep.  Last one greater than 15 years ago.  Never on AED's   Neuroimaging of the brain has not previously been performed.    PREVIOUS MEDICATIONS:  primidone, propranolol, levodopa IR (severe nausea and dyskinesia at low dose); rasagiline (costly); selegiline; amantadine; pramipexole was given but never taken  ALLERGIES:  No Known Allergies  CURRENT MEDICATIONS:  Current Outpatient Medications   Medication Instructions   amantadine (SYMMETREL) 100 MG capsule TAKE 1 CAPSULE BY MOUTH THREE TIMES A DAY   Carbidopa-Levodopa ER (SINEMET CR) 25-100 MG tablet controlled release TAKE 1 TABLET BY MOUTH THREE TIMES A DAY   propranolol (INDERAL) 20 mg, Oral, 2 times daily   selegiline (ELDEPRYL) 5 MG tablet TAKE 1 TABLET BY MOUTH EVERY MORNING PLUS 1 TAB EVERY DAY AT NOON   tiZANidine (ZANAFLEX) 2 mg, Oral, 3 times daily    Objective:   VITALS:   Vitals:   01/29/21 1011  BP: 112/62  Pulse: 75  SpO2: 95%  Weight: 136 lb (61.7 kg)  Height: 5\' 3"  (1.6 m)    GEN:  The patient appears stated age and is in NAD. HEENT:  Normocephalic, atraumatic.  The mucous membranes are moist. The superficial temporal arteries  are without ropiness or tenderness. CV:  RRR Lungs:  CTAB Neck/HEME:  There are no carotid bruits bilaterally.  There is slight hypertrophy of R SCM with head mildly rotated to the left and slight right ear to right shoulder.  She has a just in taking the stand holds the left hand to the left occiput.  Neurological examination:  Orientation: The patient is alert and oriented x3.  Cranial nerves: There is good facial symmetry. Extraocular muscles are intact. The visual fields are full to confrontational testing. The speech is fluent and clear. Soft palate rises symmetrically and there is no tongue deviation. Hearing is intact to conversational tone. Sensation: Sensation is intact to light and pinprick throughout (facial, trunk, extremities). Vibration is intact at the bilateral big toe. There is no extinction with double simultaneous stimulation. There is no sensory dermatomal level identified. Motor: Strength is 5/5 in the bilateral upper and lower extremities.   Shoulder shrug is equal and symmetric.  There is no pronator drift. Deep tendon reflexes: Deep tendon reflexes are 0-1/4 at the bilateral biceps, triceps, brachioradialis, patella and achilles. Plantar responses are  downgoing bilaterally.  Movement examination: Tone: There is mild to mod increased tone in the LUE and mild in the RUE Abnormal movements: there is LUE rest tremor Coordination:  There is min decremation with RAM's, with any form of RAMS, including alternating supination and pronation of the forearm, hand opening and closing, finger taps, heel taps and toe taps. Gait and Station: The patient has no difficulty arising out of a deep-seated chair without the use of the hands. The patient's stride length is good but she is stooped.  Has some start hesitation initially.   I have reviewed and interpreted the following labs independently   Chemistry      Component Value Date/Time   NA 143 11/21/2019 1046   K 4.8 11/21/2019 1046   CL 105 11/21/2019 1046   CO2 25 11/21/2019 1046   BUN 15 11/21/2019 1046   CREATININE 0.80 11/21/2019 1046      Component Value Date/Time   CALCIUM 9.8 11/21/2019 1046   ALKPHOS 120 (H) 11/21/2019 1046   AST 14 11/21/2019 1046   ALT 5 11/21/2019 1046   BILITOT 0.4 11/21/2019 1046      No results found for: TSH Lab Results  Component Value Date   WBC 5.0 05/10/2008   HGB 14.1 05/10/2008   HCT 42.3 05/10/2008   MCV 91.0 05/10/2008   PLT 255 05/10/2008     Total time spent on today's visit was 65 minutes, including both face-to-face time and nonface-to-face time.  Time included that spent on review of records (prior notes available to me/labs/imaging if pertinent), discussing treatment and goals, answering patient's questions and coordinating care.  Cc:  Pa, Dowell

## 2021-01-29 ENCOUNTER — Other Ambulatory Visit: Payer: Self-pay

## 2021-01-29 ENCOUNTER — Ambulatory Visit: Payer: PPO | Admitting: Neurology

## 2021-01-29 ENCOUNTER — Encounter: Payer: Self-pay | Admitting: Neurology

## 2021-01-29 VITALS — BP 112/62 | HR 75 | Ht 63.0 in | Wt 136.0 lb

## 2021-01-29 DIAGNOSIS — G249 Dystonia, unspecified: Secondary | ICD-10-CM

## 2021-01-29 DIAGNOSIS — G20 Parkinson's disease: Secondary | ICD-10-CM

## 2021-01-29 DIAGNOSIS — G243 Spasmodic torticollis: Secondary | ICD-10-CM | POA: Diagnosis not present

## 2021-01-29 NOTE — Patient Instructions (Signed)
Good to see you today!! We won't change your medications but in the future, we may look at your propranolol and/or stopping your selegeline (not sure that it is helping).   You can consider botox for the cervical dystonia if you would like.  The physicians and staff at [facility] are committed to providing excellent care. You may receive a survey requesting feedback about your experience at our office. We strive to receive "very good" responses to the survey questions. If you feel that your experience would prevent you from giving the office a "very good " response, please contact our office to try to remedy the situation. We may be reached at [phone]. Thank you for taking the time out of your busy day to complete the survey.

## 2021-02-05 ENCOUNTER — Telehealth: Payer: Self-pay | Admitting: Neurology

## 2021-02-05 NOTE — Telephone Encounter (Signed)
Pt called in and would like a call back regarding her last appt she had. She needs someone to call her insurance and give them the code for botox to see if its covered. [phone]

## 2021-02-06 NOTE — Telephone Encounter (Signed)
Called patient and asked if she was interested in Botox as per last office visit with Dr. Carles Collet patient was going to think about Botox.   Patient stated that she wanted to know if her insurance would approve her Botox. I informed patient that once she decided on if she wanted to move forward with Botox that we will contact her insurance and do the prior authorization for her.   Patient understood the process and stated she wanted to think about it a little more and will call us once she decides on what she wants to do.    Patient had no further questions or concerns.

## 2021-02-26 ENCOUNTER — Other Ambulatory Visit: Payer: Self-pay | Admitting: Neurology

## 2021-03-19 ENCOUNTER — Other Ambulatory Visit: Payer: Self-pay

## 2021-03-19 ENCOUNTER — Telehealth: Payer: Self-pay | Admitting: Neurology

## 2021-03-19 DIAGNOSIS — G20A1 Parkinson's disease without dyskinesia, without mention of fluctuations: Secondary | ICD-10-CM

## 2021-03-19 DIAGNOSIS — G20 Parkinson's disease: Secondary | ICD-10-CM

## 2021-03-19 MED ORDER — AMANTADINE HCL 100 MG PO CAPS: 100.0000 mg | ORAL_CAPSULE | Freq: Three times a day (TID) | ORAL | 1 refills | Status: DC

## 2021-03-19 NOTE — Telephone Encounter (Signed)
1. Which medications need to be refilled? (please list name of each medication and dose if known) Amantadine  2. Which pharmacy/location (including street and city if local pharmacy) is medication [facility] be sent to[facility] [address] [facility]

## 2021-03-19 NOTE — Telephone Encounter (Signed)
Called patient and verified she will be staying here at [facility] for her care and will cancel the other appointment at [facility] and I let her know that Dr. Carles Collet would be willing to refill that prescription for her then.

## 2021-04-21 ENCOUNTER — Ambulatory Visit: Payer: PPO | Admitting: Neurology

## 2021-05-21 DIAGNOSIS — H938X2 Other specified disorders of left ear: Secondary | ICD-10-CM | POA: Diagnosis not present

## 2021-07-18 ENCOUNTER — Telehealth: Payer: Self-pay | Admitting: Neurology

## 2021-07-18 ENCOUNTER — Other Ambulatory Visit: Payer: Self-pay

## 2021-07-18 DIAGNOSIS — G20 Parkinson's disease: Secondary | ICD-10-CM

## 2021-07-18 MED ORDER — SELEGILINE HCL 5 MG PO TABS: ORAL_TABLET | ORAL | 1 refills | Status: DC

## 2021-07-18 MED ORDER — CARBIDOPA-LEVODOPA ER 25-100 MG PO TBCR: 1.0000 | EXTENDED_RELEASE_TABLET | Freq: Three times a day (TID) | ORAL | 1 refills | Status: DC

## 2021-07-18 NOTE — Telephone Encounter (Signed)
Called patient and let her know both [facility] have [facility] [place] for the patient

## 2021-07-18 NOTE — Telephone Encounter (Signed)
Patient said she is out of refills of selegiline 5mg  and carbidopa/levo 25-100 ER. Tat has not filled these before, but she stated she was told she would fill them.  [facility] [place]

## 2021-07-23 ENCOUNTER — Telehealth: Payer: Self-pay | Admitting: Neurology

## 2021-07-23 DIAGNOSIS — G249 Dystonia, unspecified: Secondary | ICD-10-CM

## 2021-07-23 DIAGNOSIS — G20 Parkinson's disease: Secondary | ICD-10-CM

## 2021-07-23 NOTE — Telephone Encounter (Signed)
Sarah with [facility] called, she would like a call back. Its regarding a tier exception form. [phone] option 2

## 2021-07-23 NOTE — Telephone Encounter (Signed)
Patient called and said she is needing a tier exception for her selegiline.   She has not picked it up because it has doubled in cost.  She also needs get a tier exception for her carbidopa-levodopa for this year.  [facility] [place]  Cc: Jari Sportsman, in case she can help

## 2021-07-23 NOTE — Telephone Encounter (Signed)
Checked before leaving office today and have not received form yet

## 2021-07-23 NOTE — Telephone Encounter (Signed)
Called [facility] back and they are requesting a form for the tier exemption. I have not received that form and asked for it to be resent. I called patient and assured her as soon as I receive the form I will get it filled out and sent in right away. Patient would like a call to let her know that the form has been sent in. Patient is out of her selegine. Patient let me know that her medication has almost tripled. Last time she got her medications for 90 days it was 60$ and it is now 180$

## 2021-07-24 MED ORDER — CARBIDOPA-LEVODOPA ER 25-100 MG PO TBCR: 1.0000 | EXTENDED_RELEASE_TABLET | Freq: Three times a day (TID) | ORAL | 1 refills | Status: DC

## 2021-07-24 NOTE — Telephone Encounter (Signed)
Spoke with pt she is going to use the [employer] card for the Selegine, and [employer] for her carbidopa levodopa. Script sent in for pt. She will contact [employer] to get payment set up,

## 2021-07-29 NOTE — Progress Notes (Addendum)
Assessment/Plan:      Parkinsons Disease, sx's since at least 2015 -Patient is under dosed.  We discussed attempting to change back to immediate release versus increasing the extended release.  She is a bit nervous about trialing the immediate release because of prior extreme nausea/vomiting, and ultimately we decided to just increase the extended release.  She will take carbidopa/levodopa 25/100 CR, 2 at 8am/2 at noon/1 at 4pm.  She does state that she has some immediate release at home and may take an extra half tablet to see how she tolerates it. -Discontinue selegiline.  It has become expensive for her, and I do not think it is helping her anymore. -Patient currently on propranolol, 20 mg twice per day.  This was originally started for a diagnosis of essential tremor (which was later changed to Parkinson's disease).  She is not sure that it helps tremor.  Talk to her today about the fact that I may discontinue this in the near future.  Many Parkinson's patients get orthostatic.  She understood. -Discussed genetic testing.  Her mother and maternal grandfather both had Parkinson's disease.  Discussed the value of this type of testing.  She can think about that.  -We talked about exercise, types of exercise and where she can achieve that.  She is doing rock steady boxing.  -Daughter present today (first time meeting her).  She has several questions and I answered those to the best of my ability today.   2.  Parkinson's dyskinesia             -Patient to continue amantadine, 100 mg 3 times per day.  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   3.  RBD             -Overall mild and no additional medication required.   4.  Cervical dystonia             -Is the likely source of the neck pain, but that is actually better, which is good because she was very nervous about the Botox.   5  dysphagia  -we will schedule MBE  6.   Memory change  -discussed neurocog testing but they decided to hold on that for now Subjective:   Chelsea Conley was seen today in follow up for Parkinsons disease.  My previous records were reviewed prior to todays visit as well as outside records available to me.  Pt with daughter who supplements hx.  At last visit, patient was certainly underdosed.  However, she did not want to change the dosing.  The same is true with her propranolol.  Pt denies falls.  Pt denies lightheadedness, near syncope.  No hallucinations.  Mood has been good.  Last visit, discussed botox for the cervical dystonia, but ultimately the patient decided to hold off on that.  She is doing better with her neck pain.  She is having start hesitation.  Having trouble swallowing solids.  Daughter asks about memory changes associated with Parkinson's disease.  She asks about abnormal movements/dyskinesia.  Current prescribed movement disorder medications: Carbidopa/levodopa 25/100 CR, 1 tablet 3 times per day Selegiline, 1 tablet twice per day Propranolol 20 mg twice per day (originally started because she had a diagnosis of essential tremor) Amantadine, 100 mg 3 times per day  ALLERGIES:  No Known Allergies  CURRENT MEDICATIONS:  Current Meds  Medication Sig   amantadine (SYMMETREL) 100 MG capsule Take  1 capsule (100 mg total) by mouth in the morning, at noon, and at bedtime.   Carbidopa-Levodopa ER (SINEMET CR) 25-100 MG tablet controlled release Take 1 tablet by mouth 3 (three) times daily.   propranolol (INDERAL) 20 MG tablet Take 1 tablet (20 mg total) by mouth 2 (two) times daily.   selegiline (ELDEPRYL) 5 MG tablet TAKE 1 TABLET BY MOUTH EVERY MORNING PLUS 1 TAB EVERY DAY AT 11AM     Objective:   PHYSICAL EXAMINATION:    VITALS:   Vitals:   07/31/21 1433  BP: 119/65  Pulse: 79  SpO2: 98%  Weight: 136 lb (61.7 kg)  Height: 5\' 3"  (1.6 m)    GEN:  The patient appears stated age and is in NAD. HEENT:   Normocephalic, atraumatic.  The mucous membranes are moist. The superficial temporal arteries are without ropiness or tenderness. CV:  RRR Lungs:  CTAB Neck/HEME:  There are no carotid bruits bilaterally.  Neurological examination:  Orientation: The patient is alert and oriented x3. Cranial nerves: There is good facial symmetry with mild facial hypomimia. The speech is fluent and clear. Soft palate rises symmetrically and there is no tongue deviation. Hearing is intact to conversational tone. Sensation: Sensation is intact to light touch throughout Motor: Strength is at least antigravity x4.  Patient was seen approximately 2:30 PM and had not taken medication since 9:30 AM  Movement examination: Tone: There is n mild to moderate increased tone in left upper extremity.  Mild increased tone in the right upper extremity. Abnormal movements: There is left upper extremity rest tremor. Coordination:  There is mild decremation with RAM's Gait and Station: The patient has no difficulty arising out of a deep-seated chair without the use of the hands.  She has some start hesitation, but once she gets walking, she does fairly well.  She is mildly flexed.    I have reviewed and interpreted the following labs independently    Chemistry      Component Value Date/Time   NA 143 11/21/2019 1046   K 4.8 11/21/2019 1046   CL 105 11/21/2019 1046   CO2 25 11/21/2019 1046   BUN 15 11/21/2019 1046   CREATININE 0.80 11/21/2019 1046      Component Value Date/Time   CALCIUM 9.8 11/21/2019 1046   ALKPHOS 120 (H) 11/21/2019 1046   AST 14 11/21/2019 1046   ALT 5 11/21/2019 1046   BILITOT 0.4 11/21/2019 1046       Lab Results  Component Value Date   WBC 5.0 05/10/2008   HGB 14.1 05/10/2008   HCT 42.3 05/10/2008   MCV 91.0 05/10/2008   PLT 255 05/10/2008    No results found for: TSH   Total time spent on today's visit was 42 minutes, including both face-to-face time and nonface-to-face time.   Time included that spent on review of records (prior notes available to me/labs/imaging if pertinent), discussing treatment and goals, answering patient's questions and coordinating care.  Cc:  Pa, [place]

## 2021-07-31 ENCOUNTER — Encounter: Payer: Self-pay | Admitting: Neurology

## 2021-07-31 ENCOUNTER — Other Ambulatory Visit: Payer: Self-pay

## 2021-07-31 ENCOUNTER — Ambulatory Visit: Payer: PPO | Admitting: Neurology

## 2021-07-31 VITALS — BP 119/65 | HR 79 | Ht 63.0 in | Wt 136.0 lb

## 2021-07-31 DIAGNOSIS — G20 Parkinson's disease: Secondary | ICD-10-CM | POA: Diagnosis not present

## 2021-07-31 DIAGNOSIS — G249 Dystonia, unspecified: Secondary | ICD-10-CM | POA: Diagnosis not present

## 2021-07-31 DIAGNOSIS — R1310 Dysphagia, unspecified: Secondary | ICD-10-CM | POA: Diagnosis not present

## 2021-07-31 MED ORDER — CARBIDOPA-LEVODOPA ER 25-100 MG PO TBCR: EXTENDED_RELEASE_TABLET | ORAL | 1 refills | Status: DC

## 2021-07-31 NOTE — Patient Instructions (Addendum)
STOP selegeline  Week 1: Take carbidopa/levodopa 25/100 CR, 2 at 8am/1 at noon/1 at 4pm  Week 2 and beyond:  Take carbidopa/levodopa 25/100 Cr, 2 at 8am/2 at noon/1 at 4pm

## 2021-08-01 ENCOUNTER — Telehealth (HOSPITAL_COMMUNITY): Payer: Self-pay

## 2021-08-01 NOTE — Telephone Encounter (Signed)
Attempted to contact patient to schedule OP MBS - left voicemail. ?

## 2021-08-04 ENCOUNTER — Other Ambulatory Visit (HOSPITAL_COMMUNITY): Payer: Self-pay

## 2021-08-04 DIAGNOSIS — R1310 Dysphagia, unspecified: Secondary | ICD-10-CM

## 2021-08-05 ENCOUNTER — Ambulatory Visit: Payer: PPO | Admitting: Neurology

## 2021-08-07 NOTE — Telephone Encounter (Signed)
Has this been completed? Okay to close/sign encounter?

## 2021-08-07 NOTE — Telephone Encounter (Signed)
F/u   Call [employer] initiate tier exceptions.   Ref # [id number]  Medication carbidopa-levodopa  Medication selegiline  Will be in touch with office and the patient on next steps.

## 2021-08-07 NOTE — Telephone Encounter (Deleted)
F/u   Called the patient

## 2021-08-12 NOTE — Telephone Encounter (Signed)
F/u   Received fax from [employer] notice of denial of medicare prescription drug coverage  The request drug is already at the lowest possible tier lowest cost for the brand / generic status of the drug, therefore, a tier exception lower cost is not allowed.   Selegiline 5 mg tablet

## 2021-08-13 ENCOUNTER — Ambulatory Visit (HOSPITAL_COMMUNITY)
Admission: RE | Admit: 2021-08-13 | Discharge: 2021-08-13 | Disposition: A | Discharge status: Home / Self Care | Payer: PPO | Source: Ambulatory Visit | Attending: Neurology | Admitting: Neurology

## 2021-08-13 ENCOUNTER — Other Ambulatory Visit: Payer: Self-pay

## 2021-08-13 DIAGNOSIS — R1310 Dysphagia, unspecified: Secondary | ICD-10-CM | POA: Insufficient documentation

## 2021-08-13 NOTE — Progress Notes (Signed)
Modified Barium Swallow Progress Note  Patient Details  Name: Chelsea Conley MRN: [id number] Date of Birth: 1951/03/11  Today's Date: 08/13/2021  Modified Barium Swallow completed.  Full report located under Chart Review in the Imaging Section.  Brief recommendations include the following:  Clinical Impression  Pt presents with oropharyngeal dysphagia characterized by reduced posterior propulsion, reduced tongue base retraction, and reduced anterior laryngeal movement. She demonstrated vallecular residue with solids, and reduced PES distension with an intermittent CP bar and residue just posterior to the PES with larger boluses of solids. Difficulty was noted with A-P transport of the barium tablet when liquids were used, but this was facilitated with use of a puree bolus. Effortful swallows appeared to be necessary for pharyngeal transport of solids. Vallecular residue increased with bolus size. A liquids wash was effective in reducing pharyngeal residue and penetration (PAS 2, 3) was intermittently noted with consecutive swallows of thin liquids via straw when solid pharyngeal residue was present. A regular texture diet and thin liquids is recommended with observance of swallowing precautions. Outpatient SLP services are recommended for dysphagia. Pt presented with reduced vocal intensity and she reported voice changes in the last few months and she expressed that her husband has been requesting more repetitions during this time. Outpatient SLP services therefore also appear to be clinically indicated for parkinson's disease-associated dysarthria.   Swallow Evaluation Recommendations       SLP Diet Recommendations: Regular solids;Thin liquid   Liquid Administration via: Cup;Straw   Medication Administration: Whole meds with liquid   Supervision: Patient able to self feed   Compensations: Slow rate;Small sips/bites;Follow solids with liquid   Postural Changes: Seated upright at 90  degrees   Oral Care Recommendations: Oral care BID      [name] I. [name], [name], [name] Office number [phone] Pager [phone]   Horton Marshall 08/13/2021,1:56 PM

## 2021-08-14 ENCOUNTER — Other Ambulatory Visit: Payer: Self-pay

## 2021-08-14 DIAGNOSIS — R1310 Dysphagia, unspecified: Secondary | ICD-10-CM

## 2021-08-14 NOTE — Progress Notes (Signed)
Internal referral has been put in for this patient

## 2021-08-15 ENCOUNTER — Telehealth: Payer: Self-pay | Admitting: Neurology

## 2021-08-15 NOTE — Telephone Encounter (Signed)
Chelsea Conley changed her meds 2 weeks ago. Has been throwing up, feels hot, tremors are worse. Wants to know if this is due to the medicine

## 2021-08-15 NOTE — Telephone Encounter (Signed)
Patient is taking the CR so I followed Dr. Arturo Morton recommendations and asked patient was she taking with a carbohydrate patient said she tries to eat something so I did tell her to try to wait one hour before taking with a protein. Patient is going to start taking medication 1 pill 5 x a day starting at 02/20/11/2/4pm

## 2021-08-18 DIAGNOSIS — H9012 Conductive hearing loss, unilateral, left ear, with unrestricted hearing on the contralateral side: Secondary | ICD-10-CM | POA: Diagnosis not present

## 2021-08-18 DIAGNOSIS — H6522 Chronic serous otitis media, left ear: Secondary | ICD-10-CM | POA: Diagnosis not present

## 2021-08-18 DIAGNOSIS — H6982 Other specified disorders of Eustachian tube, left ear: Secondary | ICD-10-CM | POA: Diagnosis not present

## 2021-08-18 DIAGNOSIS — H6992 Unspecified Eustachian tube disorder, left ear: Secondary | ICD-10-CM | POA: Insufficient documentation

## 2021-08-18 DIAGNOSIS — J3489 Other specified disorders of nose and nasal sinuses: Secondary | ICD-10-CM | POA: Diagnosis not present

## 2021-08-18 HISTORY — DX: Unspecified Eustachian tube disorder, left ear: H69.92

## 2021-08-27 ENCOUNTER — Ambulatory Visit: Payer: PPO | Attending: Neurology

## 2021-08-27 ENCOUNTER — Other Ambulatory Visit: Payer: Self-pay

## 2021-08-27 DIAGNOSIS — R41841 Cognitive communication deficit: Secondary | ICD-10-CM | POA: Insufficient documentation

## 2021-08-27 DIAGNOSIS — R1312 Dysphagia, oropharyngeal phase: Secondary | ICD-10-CM | POA: Diagnosis not present

## 2021-08-27 DIAGNOSIS — R471 Dysarthria and anarthria: Secondary | ICD-10-CM | POA: Diagnosis not present

## 2021-08-27 NOTE — Patient Instructions (Signed)
SWALLOWING EXERCISES  Effortful Swallows - Squeeze hard with the muscles in your neck while you swallow your  saliva or a sip of water - Repeat 25-30 times, 3-4 times a day, and use whenever you eat or drink -30 times a day  Masako Swallow - swallow with your tongue sticking out - Stick tongue out past the lips - -gently bite tongue to hold it steady, and swallow - Repeat 25-30 times, 3-4 times a day *use a wet spoon if your mouth gets dry* -10 times a day

## 2021-08-27 NOTE — Therapy (Signed)
[facility] [address] [place], [place], [address] Phone: [phone]   Fax:  [phone]  Speech Language Pathology Evaluation  Patient Details  Name: Chelsea Conley MRN: [id number] Date of Birth: March 04, 1951 Referring Provider (SLP): Tat, Eustace Quail, DO   Encounter Date: 08/27/2021   End of Session - 08/27/21 1343     Visit Number 1    Number of Visits 13    Date for SLP Re-Evaluation 11/21/21    Authorization Type [employer] $15 copay    SLP Start Time 1355    SLP Stop Time  1440    SLP Time Calculation (min) 45 min    Activity Tolerance Patient tolerated treatment well             Past Medical History:  Diagnosis Date   History of seizures    History of tremor    Parkinson disease ([place]) 05/15/2015    Past Surgical History:  Procedure Laterality Date   FOOT SURGERY Bilateral    MOUTH SURGERY     X2   TONSILLECTOMY      There were no vitals filed for this visit.       SLP Evaluation [facility] - 08/27/21 1342       SLP Visit Information   SLP Received On 08/14/21    Referring Provider (SLP) Tat, Eustace Quail, DO    Onset Date PD - 2015/2016    swallowing reportedly has gotten worse in last couple months   Medical Diagnosis Dysphagia   Parkinson's Disease     Subjective   Subjective "I feel like I'm going to choke"    Patient/Family Stated Goal to improve swallow function and vocal intensity      General Information   HPI Pt is a 71 y/o female who was diagnosed with Parkinson's disease in 2015 and was referred for an outpatient modified barium swallow study by Dr. Alfonso Patten. Tat due to c/o dysphagia with solids. Pt reported the sensation of food "sticking". She stated that she has been symptomatic for years and she denied any worsening of symptoms. She identified the sternal notch as the site of the sensation and she stated that a liquid wash helps.      Balance Screen   Has the patient fallen in the past 6  months No      Prior Functional Status   Cognitive/Linguistic Baseline Within functional limits    Type of Home House     Lives With Significant other    Available Support Family    Education 12th grade    Vocation Retired   Engineer, petroleum at [employer]     Pain Assessment   Pain Assessment 0-10    Pain Score 5     Pain Location Neck    Pain Intervention(s) Heat applied      Cognition   Overall Cognitive Status Impaired/Different from baseline   needs further assessment   Area of Impairment Memory    Memory Comments difficulty recalling names      Auditory Comprehension   Overall Auditory Comprehension Appears within functional limits for tasks assessed      Verbal Expression   Overall Verbal Expression Appears within functional limits for tasks assessed      Oral Motor/Sensory Function   Overall Oral Motor/Sensory Function Impaired    Labial ROM Within Functional Limits    Labial Symmetry Within Functional Limits    Labial Coordination Reduced    Lingual Symmetry Within Functional  Limits    Lingual Strength Reduced    Lingual Coordination Reduced    Facial Symmetry Within Functional Limits    Facial Coordination Reduced      Motor Speech   Overall Motor Speech Impaired    Respiration Impaired    Level of Impairment Conversation    Phonation Low vocal intensity    Resonance Within functional limits    Articulation Within functional limitis    Intelligibility Intelligible    Motor Planning Witnin functional limits    Effective Techniques Increased vocal intensity;Pause;Pacing                             SLP Education - 08/27/21 1346     Education Details eval results, possible goals, swallow recommendations, swallow exercises (effortful swallows, Masako)    Person(s) Educated Patient    Methods Explanation    Comprehension Verbalized understanding;Need further instruction              SLP Short Term Goals - 08/27/21 1520        SLP SHORT TERM GOAL #1   Title Pt will complete recommended swallow exercise HEP to maximize swallow function given occasional min A over 2 sessions    Time 4    Period Weeks    Status New      SLP SHORT TERM GOAL #2   Title Pt will utilize recommended compensatory swallow strategies to optimize swallow safety during PO intake given occasional min A over 2 sessions    Time 4    Period Weeks    Status New      SLP SHORT TERM GOAL #3   Title Pt will complete loud /a/ or alternative with average of low 90s dB given occasional min A over 2 sessions    Time 4    Period Weeks    Status New      SLP SHORT TERM GOAL #4   Title Pt will utilize abdominal breathing on structured speech tasks with 80% accuracy given occasional min A over 2 sessions    Time 4    Period Weeks    Status New      SLP SHORT TERM GOAL #5   Title Pt will maintain WNL conversational volume (70-72 dB) in 10+ minute simple conversation given occasional min A over 2 sessions    Time 4    Period Weeks    Status New              SLP Long Term Goals - 08/27/21 1521       SLP LONG TERM GOAL #1   Title Pt will complete recommended swallow exercise HEP to maximize swallow function given rare min A over 2 sessions    Time 8    Period Weeks    Status New      SLP LONG TERM GOAL #2   Title Pt will utilize recommended compensatory swallow strategies to optimize swallow safety during PO intake given rare min A over 2 sessions    Time 8    Period Weeks    Status New      SLP LONG TERM GOAL #3   Title Pt will maintain WNL conversational volume (70-72 dB) in 20+ minute mod complex conversation given occasional min A over 2 sessions    Time 12    Period Weeks    Status New      SLP LONG TERM GOAL #4  Title Pt will report improved swallow function and communication effectiveness via PROM by 2 points at last ST session    Baseline Eat-10=16 & CPIB=12    Time 12    Period Weeks    Status New      SLP LONG  TERM GOAL #5   Title Pt will complete formal cognitive linguistic evaluation as warranted    Time 12    Period Weeks    Status New              Plan - 08/27/21 1344     Clinical Impression Statement Chelsea Conley was referred by Dr. Carles Collet to address dysphagia secondary to Parkinsons disease. Pt began demonstrating s/sx of PD in 2015 and diagnosed with PD in late 2016 under care of Dr. Jannifer Franklin. Pt recently transferred to care of Dr. Carles Collet. MBSS recently completed which revealed: oropharyngeal dysphagia characterized by reduced posterior propulsion, reduced tongue base retraction, and reduced anterior laryngeal movement. She demonstrated vallecular residue with solids. Effortful swallows appeared to be necessary for pharyngeal transport of solids. Vallecular residue increased with bolus size. A liquids wash was effective in reducing pharyngeal residue and penetration (PAS 2, 3) was intermittently noted with consecutive swallows of thin liquids via straw when solid pharyngeal residue was present. Pt was recommended regular texture and thin liquid diet with use of swallow precautions. MBSS results reviewed today, with SLP recommending intiation of swallow exercise HEP to target oropharyngeal deficits and aid carryover of recommended swallow strategies. Pt declined solid PO trials today but agreeable to thin liquid trials via straw. No overt s/sx of aspiration exhibited for thin liquids. Pt reports cutting up food into small bites, frequently drinking during meals, and use of straw for improved bolus control. Pt denied any choking episodes but occasional coughing and throat clearing reported. Low vocal intensity also reported, in which pt reports husband has been requesting more repetition at home. Today, conversational volume decayed from low 70s to mid-upper 60s dB. Reduced breath support noted for sustained /a/ (averaged 92 dB) for limited duration (8-9 seconds). Some possible mild memory changes reported, in  which pt endorsed difficulty recalling names. Pt denied any functional memory impairments related to medicine, finance, or household management. SLP recommended PT/OT evaluations to address significant dyskinesia, impaired coordination, and reduced mobility; but pt politely declined at this time. SLP will continue to recommend these services as pt would highly benefit. Skilled ST is warranted to address dysphagia, dysarthria, and possibly cognition to optimize functional independence and safety.    Speech Therapy Frequency 1x /week   pt elected for 1x/week   Duration 12 weeks    Treatment/Interventions Compensatory strategies;Patient/family education;Functional tasks;Language facilitation;Compensatory techniques;Internal/external aids;SLP instruction and feedback;Multimodal communcation approach;Cognitive reorganization;Environmental controls;Diet toleration management by SLP;Aspiration precaution training;Pharyngeal strengthening exercises    Potential to Achieve Goals Good    Consulted and Agree with Plan of Care Patient             Patient will benefit from skilled therapeutic intervention in order to improve the following deficits and impairments:   Dysphagia, oropharyngeal phase  Dysarthria and anarthria  Cognitive communication deficit    Problem List Patient Active Problem List   Diagnosis Date Noted   Arthritis of carpometacarpal [facility]) joint of right thumb 12/21/2018   HLD (hyperlipidemia) 05/15/2015   Seizure ([place]) 05/15/2015   Has a tremor 05/15/2015   Parkinson disease ([place]) 05/15/2015    Alinda Deem, [place] 08/27/2021, 3:31 PM  [facility] [phone]  [place] [facility] [place], [place], [address] Phone: [phone]   Fax:  [phone]  Name: Chelsea Conley MRN: [id number] Date of Birth: 1950-10-24

## 2021-09-03 ENCOUNTER — Ambulatory Visit: Payer: PPO

## 2021-09-03 ENCOUNTER — Other Ambulatory Visit: Payer: Self-pay

## 2021-09-03 DIAGNOSIS — R471 Dysarthria and anarthria: Secondary | ICD-10-CM

## 2021-09-03 DIAGNOSIS — R1312 Dysphagia, oropharyngeal phase: Secondary | ICD-10-CM | POA: Diagnosis not present

## 2021-09-03 NOTE — Therapy (Signed)
[facility] [address] [place], [place], [address] Phone: [phone]   Fax:  [phone]  Speech Language Pathology Treatment  Patient Details  Name: Chelsea Conley MRN: [id number] Date of Birth: 1951-04-11 Referring Provider (SLP): Tat, Eustace Quail, DO   Encounter Date: 09/03/2021   End of Session - 09/03/21 1308     Visit Number 2    Number of Visits 13    Date for SLP Re-Evaluation 11/21/21    Authorization Type HTA $15 copay    SLP Start Time 1315    SLP Stop Time  1400    SLP Time Calculation (min) 45 min    Activity Tolerance Patient tolerated treatment well             Past Medical History:  Diagnosis Date   History of seizures    History of tremor    Parkinson disease ([place]) 05/15/2015    Past Surgical History:  Procedure Laterality Date   FOOT SURGERY Bilateral    MOUTH SURGERY     X2   TONSILLECTOMY      There were no vitals filed for this visit.   Subjective Assessment - 09/03/21 1309     Subjective "pretty good"    Currently in Pain? No/denies                   ADULT SLP TREATMENT - 09/03/21 1308       General Information   Behavior/Cognition Alert;Cooperative;Pleasant mood      Treatment Provided   Treatment provided Dysphagia;Cognitive-Linquistic      Dysphagia Treatment   Treatment Methods Skilled observation;Therapeutic exercise;Compensation strategy training    Patient observed directly with PO's Yes    Type of PO's observed Thin liquids   pt declined solids   Feeding Able to feed self    Liquids provided via Cup    Type of cueing Verbal    Amount of cueing Moderate    Other treatment/comments Pt reported some difficulty swallowing candy gummy, which cleared with consecutive liquid washes. No other overt difficulty reported with other solid textures. Pt declined solid PO trials today but agreeable to trials of thin liquids. Pt endorsed completion of swallow  exercise HEP 1x/day (~10 Masako and 10 effortful swallows). SLP re-educated recommended compensatory swallow strategies (alternation of liquids/solids) and 2 introduced swallow exercises (effortful swallows and Masko swallows). SLP trialed use of visual and tactile feedback as pt exhibited some inconsistent awareness of swallow initations. Dry swallows were also increasingly difficult to complete, in which SLP instructed and cued use of small sips to complete effortful swallow and moisten oral cavity between Masako swallows.      Cognitive-Linquistic Treatment   Treatment focused on Dysarthria;Patient/family/caregiver education    Skilled Treatment SLP initiated education and instruction of dysarthria HEP, including abdominal breathing, loud /a/, and targeted effort level. Loud /a/ averaged mid 80s dB with improved duration compared to eval with use of cued abdominal breath. With targeted 8/10 effort level, SLP trialed carryover to short, structured conversation. Limited carryover exhibited with initial cueing, which will be targeted in upcoming ST sessions. Of note, no overt dykinesia exhibited today compared to ST evaluation. SLP did mention PT/OT recommendations, in which pt elected to defer referral at this time.      Assessment / Recommendations / Plan   Plan Continue with current plan of care      Progression Toward Goals   Progression toward goals Progressing toward goals  SLP Education - 09/03/21 1419     Education Details swallow exercises & precautions, visual/tactile feedback, dysarthria HEP    Person(s) Educated Patient    Methods Explanation;Demonstration;Verbal cues;Handout    Comprehension Verbalized understanding;Returned demonstration;Need further instruction              SLP Short Term Goals - 09/03/21 1308       SLP SHORT TERM GOAL #1   Title Pt will complete recommended swallow exercise HEP to maximize swallow function given occasional min A over 2  sessions    Time 4    Period Weeks    Status On-going      SLP SHORT TERM GOAL #2   Title Pt will utilize recommended compensatory swallow strategies to optimize swallow safety during PO intake given occasional min A over 2 sessions    Time 4    Period Weeks    Status On-going      SLP SHORT TERM GOAL #3   Title Pt will complete loud /a/ or alternative with average of low 90s dB given occasional min A over 2 sessions    Time 4    Period Weeks    Status On-going      SLP SHORT TERM GOAL #4   Title Pt will utilize abdominal breathing on structured speech tasks with 80% accuracy given occasional min A over 2 sessions    Time 4    Period Weeks    Status On-going      SLP SHORT TERM GOAL #5   Title Pt will maintain WNL conversational volume (70-72 dB) in 10+ minute simple conversation given occasional min A over 2 sessions    Time 4    Period Weeks    Status On-going              SLP Long Term Goals - 09/03/21 1309       SLP LONG TERM GOAL #1   Title Pt will complete recommended swallow exercise HEP to maximize swallow function given rare min A over 2 sessions    Time 8    Period Weeks    Status On-going      SLP LONG TERM GOAL #2   Title Pt will utilize recommended compensatory swallow strategies to optimize swallow safety during PO intake given rare min A over 2 sessions    Time 8    Period Weeks    Status On-going      SLP LONG TERM GOAL #3   Title Pt will maintain WNL conversational volume (70-72 dB) in 20+ minute mod complex conversation given occasional min A over 2 sessions    Time 12    Period Weeks    Status On-going      SLP LONG TERM GOAL #4   Title Pt will report improved swallow function and communication effectiveness via PROM by 2 points at last ST session    Baseline Eat-10=16 & CPIB=12    Time 12    Period Weeks    Status On-going      SLP LONG TERM GOAL #5   Title Pt will complete formal cognitive linguistic evaluation as warranted    Time  12    Period Weeks    Status On-going              Plan - 09/03/21 1308     Clinical Impression Statement Chelsea Conley was referred by Dr. Carles Collet to address dysphagia secondary to Parkinsons disease. Continued education and training of swallow exercises  and recommended swallow precautions to maximize swallow function. Intermitttent min to mod A required to aid comprehension and completion today for 2 targeted exercises. Initiated education and training of dysarthria HEP, including abdominal breathing, sustained phonation, and targeted effort level. SLP continues to recommend PT/OT evaluations to impaired coordination, balance, and mobility; but pt again politely declined at this time. SLP will continue to recommend these services and consult MD. Skilled ST is warranted to address dysphagia, dysarthria, and possibly cognition to optimize functional independence and safety.    Speech Therapy Frequency 1x /week   pt elected for 1x/week   Duration 12 weeks    Treatment/Interventions Compensatory strategies;Patient/family education;Functional tasks;Language facilitation;Compensatory techniques;Internal/external aids;SLP instruction and feedback;Multimodal communcation approach;Cognitive reorganization;Environmental controls;Diet toleration management by SLP;Aspiration precaution training;Pharyngeal strengthening exercises    Potential to Achieve Goals Good    Consulted and Agree with Plan of Care Patient             Patient will benefit from skilled therapeutic intervention in order to improve the following deficits and impairments:   Dysphagia, oropharyngeal phase  Dysarthria and anarthria    Problem List Patient Active Problem List   Diagnosis Date Noted   Arthritis of carpometacarpal [facility]) joint of right thumb 12/21/2018   HLD (hyperlipidemia) 05/15/2015   Seizure ([place]) 05/15/2015   Has a tremor 05/15/2015   Parkinson disease ([place]) 05/15/2015    Marzetta Board,  [place] 09/03/2021, 2:21 PM  [facility] [address] [place], [place], [address] Phone: [phone]   Fax:  [phone]   Name: Chelsea Conley MRN: [id number] Date of Birth: [birth year]

## 2021-09-03 NOTE — Patient Instructions (Addendum)
Swallow exercises goals:  Effortful swallows (can be done with water/food): 30-50 per day  Masako swallows: 10-15 per day  Dysarthria Compensations -Practice your belly breathing - in through your nose, out through your mouth, focus on that belly movement  -Loud "ahhh" x5-10, twice a day  -Think about talking with 8 out of 10 effort/energy

## 2021-09-10 ENCOUNTER — Ambulatory Visit: Payer: PPO | Attending: Family Medicine

## 2021-09-10 ENCOUNTER — Other Ambulatory Visit: Payer: Self-pay

## 2021-09-10 DIAGNOSIS — R471 Dysarthria and anarthria: Secondary | ICD-10-CM | POA: Diagnosis not present

## 2021-09-10 DIAGNOSIS — R1312 Dysphagia, oropharyngeal phase: Secondary | ICD-10-CM

## 2021-09-10 NOTE — Therapy (Signed)
[facility] ?[place], [place], [address] ?Phone: [phone]   Fax:  [phone] ? ?Speech Language Pathology Treatment ? ?Patient Details  ?Name: Chelsea Conley ?MRN: [id number] ?Date of Birth: May 27, 1951 ?Referring Provider (SLP): Tat, Eustace Quail, DO ? ? ?Encounter Date: 09/10/2021 ? ? End of Session - 09/10/21 1311   ? ? Visit Number 3   ? Number of Visits 13   ? Date for SLP Re-Evaluation 11/21/21   ? Authorization Type HTA $15 copay   ? SLP Start Time 1315   ? SLP Stop Time  1400   ? SLP Time Calculation (min) 45 min   ? Activity Tolerance Patient tolerated treatment well   ? ?  ?  ? ?  ? ? ?Past Medical History:  ?Diagnosis Date  ? History of seizures   ? History of tremor   ? Parkinson disease ([place]) 05/15/2015  ? ? ?Past Surgical History:  ?Procedure Laterality Date  ? FOOT SURGERY Bilateral   ? MOUTH SURGERY    ? X2  ? TONSILLECTOMY    ? ? ?There were no vitals filed for this visit. ? ? Subjective Assessment - 09/10/21 1312   ? ? Subjective "I did the swallow exercises"   ? Currently in Pain? No/denies   ? ?  ?  ? ?  ? ? ? ? ? ? ? ? ADULT SLP TREATMENT - 09/10/21 1310   ? ?  ? General Information  ? Behavior/Cognition Alert;Cooperative;Pleasant mood   ?  ? Treatment Provided  ? Treatment provided Dysphagia;Cognitive-Linquistic   ?  ? Dysphagia Treatment  ? Treatment Methods Therapeutic exercise;Patient/caregiver education   ? Patient observed directly with PO's Yes   ? Type of PO's observed Thin liquids   ? Feeding Able to feed self   ? Liquids provided via Straw   ? Type of cueing Verbal;Visual   ? Amount of cueing Minimal   ? Other treatment/comments Pt has been completing effortful swallows and Masako swallow exercises as part of HEP. SLP educated and instructed isometric and isokinetic CTAR exercises to HEP today. Pt able to complete both exercises for recommended repetitions with rare min A. Pt consumed PD medication with thin  liquids via straw with no overt s/sx of aspiration. No reported swallowing difficulty reported within last week as pt continues to implement recommended swallow precautions.   ?  ? Cognitive-Linquistic Treatment  ? Treatment focused on Dysarthria;Patient/family/caregiver education   ? Skilled Treatment SLP re-educated dysarthria HEP, including sustained phonation, oral reading, and targeted effort level. Pt averaged low 90s dB for 7-9 seconds. Pt maintained WNL volume for oral reading at sentence level. For short structured conversation, increased cuing required to maintain adequate vocal intensity.   ?  ? Assessment / Recommendations / Plan  ? Plan Continue with current plan of care   ?  ? Progression Toward Goals  ? Progression toward goals Progressing toward goals   ? ?  ?  ? ?  ? ? ? SLP Education - 09/10/21 1518   ? ? Education Details CTAR, dysarthria HEP   ? Person(s) Educated Patient   ? Methods Explanation;Demonstration;Verbal cues;Handout   ? Comprehension Verbalized understanding;Returned demonstration;Need further instruction   ? ?  ?  ? ?  ? ? ? SLP Short Term Goals - 09/10/21 1311   ? ?  ? SLP SHORT TERM GOAL #1  ? Title Pt will complete recommended swallow exercise HEP to maximize swallow function  given occasional min A over 2 sessions   ? Baseline 09-10-21   ? Time 3   ? Period Weeks   ? Status On-going   ?  ? SLP SHORT TERM GOAL #2  ? Title Pt will utilize recommended compensatory swallow strategies to optimize swallow safety during PO intake given occasional min A over 2 sessions   ? Baseline 09-10-21   ? Time 3   ? Period Weeks   ? Status On-going   ?  ? SLP SHORT TERM GOAL #3  ? Title Pt will complete loud /a/ or alternative with average of low 90s dB given occasional min A over 2 sessions   ? Baseline 09-10-21   ? Time 3   ? Period Weeks   ? Status On-going   ?  ? SLP SHORT TERM GOAL #4  ? Title Pt will utilize abdominal breathing on structured speech tasks with 80% accuracy given occasional min A  over 2 sessions   ? Time 3   ? Period Weeks   ? Status On-going   ?  ? SLP SHORT TERM GOAL #5  ? Title Pt will maintain WNL conversational volume (70-72 dB) in 10+ minute simple conversation given occasional min A over 2 sessions   ? Time 3   ? Period Weeks   ? Status On-going   ? ?  ?  ? ?  ? ? ? SLP Long Term Goals - 09/10/21 1311   ? ?  ? SLP LONG TERM GOAL #1  ? Title Pt will complete recommended swallow exercise HEP to maximize swallow function given rare min A over 2 sessions   ? Time 7   ? Period Weeks   ? Status On-going   ?  ? SLP LONG TERM GOAL #2  ? Title Pt will utilize recommended compensatory swallow strategies to optimize swallow safety during PO intake given rare min A over 2 sessions   ? Time 7   ? Period Weeks   ? Status On-going   ?  ? SLP LONG TERM GOAL #3  ? Title Pt will maintain WNL conversational volume (70-72 dB) in 20+ minute mod complex conversation given occasional min A over 2 sessions   ? Time 11   ? Period Weeks   ? Status On-going   ?  ? SLP LONG TERM GOAL #4  ? Title Pt will report improved swallow function and communication effectiveness via PROM by 2 points at last ST session   ? Baseline Eat-10=16 & CPIB=12   ? Time 11   ? Period Weeks   ? Status On-going   ?  ? SLP LONG TERM GOAL #5  ? Title Pt will complete formal cognitive linguistic evaluation as warranted   ? Time 11   ? Period Weeks   ? Status On-going   ? ?  ?  ? ?  ? ? ? Plan - 09/10/21 1311   ? ? Clinical Impression Statement ?Chelsea Conley? was referred by Dr. Carles Collet to address dysphagia secondary to Parkinson?s disease. Continued education and training of swallow exercises and recommended swallow precautions to maximize swallow function. Intermitttent rare min A required to aid comprehension and completion for new swallow exercises. Conducted ongoing education and training of dysarthria HEP, including abdominal breathing, sustained phonation, and targeted effort level. Skilled ST is warranted to address dysphagia, dysarthria, and  possibly cognition to optimize functional independence and safety.   ? Speech Therapy Frequency 1x /week   pt elected for 1x/week  ?  Duration 12 weeks   ? Treatment/Interventions Compensatory strategies;Patient/family education;Functional tasks;Language facilitation;Compensatory techniques;Internal/external aids;SLP instruction and feedback;Multimodal communcation approach;Cognitive reorganization;Environmental controls;Diet toleration management by SLP;Aspiration precaution training;Pharyngeal strengthening exercises   ? Potential to Achieve Goals Good   ? Consulted and Agree with Plan of Care Patient   ? ?  ?  ? ?  ? ? ?Patient will benefit from skilled therapeutic intervention in order to improve the following deficits and impairments:   ?Dysphagia, oropharyngeal phase ? ?Dysarthria and anarthria ? ? ? ?Problem List ?Patient Active Problem List  ? Diagnosis Date Noted  ? Arthritis of carpometacarpal [facility]) joint of right thumb 12/21/2018  ? HLD (hyperlipidemia) 05/15/2015  ? Seizure ([place]) 05/15/2015  ? Has a tremor 05/15/2015  ? Parkinson disease ([place]) 05/15/2015  ? ? ?Marzetta Board, CCC-SLP ?09/10/2021, 3:19 PM ? ?[facility] ?[place], [place], [address] ?Phone: [phone]   Fax:  [phone] ? ? ?Name: Chelsea Conley ?MRN: [id number] ?Date of Birth: [birth year] ? ?

## 2021-09-10 NOTE — Patient Instructions (Addendum)
New swallow exercise: Chin Tuck Against Resistance (CTAR) ?Complete both types of CTAR:  ? ?1) Squeeze for approximately 1 minute and release. Repeat 20-30 times for 3-5 sets. Take 1 minute of rest between sets or alternating to next exercise.  ? ?2) Maintain a squeeze on ball and hold for up to 60 seconds. Repeat for up to 3 total sets. Take 1 minute of rest between sets or before alternating to next exercise.  ? ?Continue 30-50 hard swallows and 10-20 Masako swallows per day. Increase your number of repetitions as able.  ? ? ?Speech Program: ?1) Loud "ahh" x 5-10 repetitions, once or twice a day   ? ?2) Practice reading aloud (magazines, newspaper) 5-10 minutes to focus strong, powerful voice. Breathing at punctuation.   ? ? ? ? ?

## 2021-09-15 ENCOUNTER — Other Ambulatory Visit: Payer: Self-pay | Admitting: Neurology

## 2021-09-15 DIAGNOSIS — G20 Parkinson's disease: Secondary | ICD-10-CM

## 2021-09-17 ENCOUNTER — Other Ambulatory Visit: Payer: Self-pay

## 2021-09-17 ENCOUNTER — Ambulatory Visit: Payer: PPO

## 2021-09-17 DIAGNOSIS — R1312 Dysphagia, oropharyngeal phase: Secondary | ICD-10-CM | POA: Diagnosis not present

## 2021-09-17 DIAGNOSIS — R471 Dysarthria and anarthria: Secondary | ICD-10-CM

## 2021-09-17 NOTE — Therapy (Signed)
[facility] ?[place], [place], [address] ?Phone: [phone]   Fax:  [phone] ? ?Speech Language Pathology Treatment ? ?Patient Details  ?Name: Chelsea Conley ?MRN: [id number] ?Date of Birth: July 13, 1951 ?Referring Provider (SLP): Tat, Eustace Quail, DO ? ? ?Encounter Date: 09/17/2021 ? ? End of Session - 09/17/21 1311   ? ? Visit Number 4   ? Number of Visits 13   ? Date for SLP Re-Evaluation 11/21/21   ? Authorization Type HTA $15 copay   ? SLP Start Time 1315   ? SLP Stop Time  1400   ? SLP Time Calculation (min) 45 min   ? Activity Tolerance Patient tolerated treatment well   ? ?  ?  ? ?  ? ? ?Past Medical History:  ?Diagnosis Date  ? History of seizures   ? History of tremor   ? Parkinson disease ([place]) 05/15/2015  ? ? ?Past Surgical History:  ?Procedure Laterality Date  ? FOOT SURGERY Bilateral   ? MOUTH SURGERY    ? X2  ? TONSILLECTOMY    ? ? ?There were no vitals filed for this visit. ? ? Subjective Assessment - 09/17/21 1314   ? ? Subjective "pretty good"   ? Currently in Pain? No/denies   ? ?  ?  ? ?  ? ? ? ? ? ? ? ? ADULT SLP TREATMENT - 09/17/21 1311   ? ?  ? General Information  ? Behavior/Cognition Alert;Cooperative;Pleasant mood   ?  ? Treatment Provided  ? Treatment provided Dysphagia;Cognitive-Linquistic   ?  ? Dysphagia Treatment  ? Treatment Methods Therapeutic exercise;Patient/caregiver education   ? Patient observed directly with PO's Yes   ? Type of PO's observed Thin liquids   ? Feeding Able to feed self   ? Liquids provided via Straw   ? Type of cueing Verbal   ? Amount of cueing Minimal   ? Other treatment/comments Pt reports subjective improvements in swallowing as "it is easier to swallow when I'm eating." Pt continues to complete recommended swallow exercises at home, which are becoming easier to complete. Isometic and isokinetic CTAR exercises completed today with rare min A. Pt consumed thin liquids via straw with no  overt s/sx of aspiration.   ?  ? Cognitive-Linquistic Treatment  ? Treatment focused on Dysarthria;Patient/family/caregiver education   ? Skilled Treatment Pt c/o dry, scratchy sensation when completing sustained phonation HEP. Loud /a/ x5 trials conducted with average of low 90s dB. Rare min A required to optimize breath support. Pt reports increasing awareness of decline in volume. Less frequent repetitions also requested at home. Paragraph level oral reading was WNL volume with focus on intentional speaking. Conversational speech for structured topics averaged low 70s with occasional fluctuations in upper 60s dB. SLP provided occasional min A to ID and correct faded volume.   ?  ? Assessment / Recommendations / Plan  ? Plan Continue with current plan of care   ?  ? Progression Toward Goals  ? Progression toward goals Progressing toward goals   ? ?  ?  ? ?  ? ? ? SLP Education - 09/17/21 1348   ? ? Education Details abdominal breathing, speaking with intention   ? Person(s) Educated Patient   ? Methods Explanation;Demonstration;Verbal cues;Handout   ? Comprehension Verbalized understanding;Returned demonstration;Need further instruction   ? ?  ?  ? ?  ? ? ? SLP Short Term Goals - 09/17/21 1312   ? ?  ?  SLP SHORT TERM GOAL #1  ? Title Pt will complete recommended swallow exercise HEP to maximize swallow function given occasional min A over 2 sessions   ? Baseline 09-10-21, 09-17-21   ? Time 2   ? Period Weeks   ? Status Achieved   ?  ? SLP SHORT TERM GOAL #2  ? Title Pt will utilize recommended compensatory swallow strategies to optimize swallow safety during PO intake given occasional min A over 2 sessions   ? Baseline 09-10-21, 09-17-21   ? Time 2   ? Period Weeks   ? Status Achieved   ?  ? SLP SHORT TERM GOAL #3  ? Title Pt will complete loud /a/ or alternative with average of low 90s dB given occasional min A over 2 sessions   ? Baseline 09-10-21, 09-17-21   ? Time 2   ? Period Weeks   ? Status Achieved   ?  ? SLP SHORT  TERM GOAL #4  ? Title Pt will utilize abdominal breathing on structured speech tasks with 80% accuracy given occasional min A over 2 sessions   ? Baseline 09-17-21   ? Time 2   ? Period Weeks   ? Status On-going   ?  ? SLP SHORT TERM GOAL #5  ? Title Pt will maintain WNL conversational volume (70-72 dB) in 10+ minute simple conversation given occasional min A over 2 sessions   ? Time 2   ? Period Weeks   ? Status On-going   ? ?  ?  ? ?  ? ? ? SLP Long Term Goals - 09/17/21 1312   ? ?  ? SLP LONG TERM GOAL #1  ? Title Pt will complete recommended swallow exercise HEP to maximize swallow function given rare min A over 2 sessions   ? Time 6   ? Period Weeks   ? Status On-going   ?  ? SLP LONG TERM GOAL #2  ? Title Pt will utilize recommended compensatory swallow strategies to optimize swallow safety during PO intake given rare min A over 2 sessions   ? Time 6   ? Period Weeks   ? Status On-going   ?  ? SLP LONG TERM GOAL #3  ? Title Pt will maintain WNL conversational volume (70-72 dB) in 20+ minute mod complex conversation given occasional min A over 2 sessions   ? Time 10   ? Period Weeks   ? Status On-going   ?  ? SLP LONG TERM GOAL #4  ? Title Pt will report improved swallow function and communication effectiveness via PROM by 2 points at last ST session   ? Baseline Eat-10=16 & CPIB=12   ? Time 10   ? Period Weeks   ? Status On-going   ?  ? SLP LONG TERM GOAL #5  ? Title Pt will complete formal cognitive linguistic evaluation as warranted   ? Time 10   ? Period Weeks   ? Status On-going   ? ?  ?  ? ?  ? ? ? Plan - 09/17/21 1312   ? ? Clinical Impression Statement ?Chelsea Conley? was referred by Dr. Carles Collet to address dysphagia secondary to Parkinson?s disease. Pt reports subjective improvements in swallow function with the endorsement "easier to swallow when I'm eating." Continued education and training of swallow exercises and recommended swallow precautions to maximize swallow function. Intermitttent rare min A required to  aid completion for targeted swallow exercises. Conducted ongoing education and training of dysarthria  HEP, including abdominal breathing, sustained phonation, and targeted effort level. Skilled ST is warranted to address dysphagia, dysarthria, and possibly cognition to optimize functional independence and safety.   ? Speech Therapy Frequency 1x /week   pt elected for 1x/week  ? Duration 12 weeks   ? Treatment/Interventions Compensatory strategies;Patient/family education;Functional tasks;Language facilitation;Compensatory techniques;Internal/external aids;SLP instruction and feedback;Multimodal communcation approach;Cognitive reorganization;Environmental controls;Diet toleration management by SLP;Aspiration precaution training;Pharyngeal strengthening exercises   ? Potential to Achieve Goals Good   ? Consulted and Agree with Plan of Care Patient   ? ?  ?  ? ?  ? ? ?Patient will benefit from skilled therapeutic intervention in order to improve the following deficits and impairments:   ?Dysphagia, oropharyngeal phase ? ?Dysarthria and anarthria ? ? ? ?Problem List ?Patient Active Problem List  ? Diagnosis Date Noted  ? Arthritis of carpometacarpal [facility]) joint of right thumb 12/21/2018  ? HLD (hyperlipidemia) 05/15/2015  ? Seizure ([place]) 05/15/2015  ? Has a tremor 05/15/2015  ? Parkinson disease ([place]) 05/15/2015  ? ? ?Marzetta Board, CCC-SLP ?09/17/2021, 2:08 PM ? ?[facility] ?[place] ?[place], [place], [address] ?Phone: [phone]   Fax:  [phone] ? ? ?Name: Chelsea Conley ?MRN: [id number] ?Date of Birth: [birth year] ? ?

## 2021-09-17 NOTE — Patient Instructions (Addendum)
Today we practiced speaking with intent/purpose to make each word stand out. Remember how your volume/effort increased with background noise. This should be occurring at home too when you are talking over music/television.  ? ?Continue to practice: ?-Loud "ahh" x5-10x. Take a deep breath before each repetition. Take a sip or water and a few extra belly breathes in between if you feel dry/scratchy.  ?-Continue to read aloud at home.  ?-Speak with intention. Make each and every word stand out! ? ? ? ? ? ? ? ? ? ? ?

## 2021-09-24 ENCOUNTER — Other Ambulatory Visit: Payer: Self-pay

## 2021-09-24 ENCOUNTER — Ambulatory Visit: Payer: PPO

## 2021-09-24 DIAGNOSIS — R1312 Dysphagia, oropharyngeal phase: Secondary | ICD-10-CM

## 2021-09-24 DIAGNOSIS — R471 Dysarthria and anarthria: Secondary | ICD-10-CM

## 2021-09-24 NOTE — Patient Instructions (Signed)
New Goal: Effortful swallows and Masako swallows - 50 per day ? ?Fit them into your routine - waiting rooms, while watching tv, in the car ? ?Continue speaking with intent and being aware of when your volume drops or fades. Keep that strong, powerful voice  ? ?

## 2021-09-24 NOTE — Therapy (Signed)
[facility] ?[facility] ?[place], [place], [address] ?Phone: [phone]   Fax:  [phone] ? ?Speech Language Pathology Treatment ? ?Patient Details  ?Name: Chelsea Conley ?MRN: [id number] ?Date of Birth: 01-03-1951 ?Referring Provider (SLP): Tat, Eustace Quail, DO ? ? ?Encounter Date: 09/24/2021 ? ? End of Session - 09/24/21 1315   ? ? Visit Number 5   ? Number of Visits 13   ? Date for SLP Re-Evaluation 11/21/21   ? Authorization Type HTA $15 copay   ? SLP Start Time 1316   ? SLP Stop Time  1355   ? SLP Time Calculation (min) 39 min   ? Activity Tolerance Patient tolerated treatment well   ? ?  ?  ? ?  ? ? ?Past Medical History:  ?Diagnosis Date  ? History of seizures   ? History of tremor   ? Parkinson disease ([place]) 05/15/2015  ? ? ?Past Surgical History:  ?Procedure Laterality Date  ? FOOT SURGERY Bilateral   ? MOUTH SURGERY    ? X2  ? TONSILLECTOMY    ? ? ?There were no vitals filed for this visit. ? ? Subjective Assessment - 09/24/21 1316   ? ? Subjective "same old stuff"   ? Currently in Pain? No/denies   ? ?  ?  ? ?  ? ? ? ? ? ? ? ? ADULT SLP TREATMENT - 09/24/21 1315   ? ?  ? General Information  ? Behavior/Cognition Alert;Cooperative;Pleasant mood   ?  ? Treatment Provided  ? Treatment provided Dysphagia;Cognitive-Linquistic   ?  ? Dysphagia Treatment  ? Treatment Methods Therapeutic exercise;Patient/caregiver education   ? Patient observed directly with PO's Yes   ? Type of PO's observed Thin liquids   ? Feeding Able to feed self   ? Liquids provided via Straw   ? Type of cueing Verbal   ? Amount of cueing Modified independent   ? Other treatment/comments Denied any swallowing difficulty over last week. Pt reported 5-10 Masako swallows per day. SLP challenged patient to complete additional repetitions if able, in which pt completed 50 reps today. SLP modified HEP to target 50 reps of effortful swallows and Masako swallows per day. Pt consumed  thin liquids with no overt s/sx of aspiration.   ?  ? Cognitive-Linquistic Treatment  ? Treatment focused on Dysarthria;Patient/family/caregiver education   ? Skilled Treatment Pt continues with HEP at home. Loud /a/ averaged low 90s dB. Conversational averaged upper 60s dB (range: upper 60s dB to low 70s dB), which improved to low 70s with intermittent visual cues. Pt continues to report improving awareness of volume decay.   ?  ? Assessment / Recommendations / Plan  ? Plan Continue with current plan of care   ?  ? Progression Toward Goals  ? Progression toward goals Progressing toward goals   ? ?  ?  ? ?  ? ? ? SLP Education - 09/24/21 1349   ? ? Education Details increased repetition of swallow exercises, speak with intent   ? Person(s) Educated Patient   ? Methods Explanation;Demonstration;Handout   ? Comprehension Verbalized understanding;Returned demonstration;Need further instruction   ? ?  ?  ? ?  ? ? ? SLP Short Term Goals - 09/24/21 1315   ? ?  ? SLP SHORT TERM GOAL #1  ? Title Pt will complete recommended swallow exercise HEP to maximize swallow function given occasional min A over 2 sessions   ? Baseline 09-10-21,  09-17-21   ? Status Achieved   ?  ? SLP SHORT TERM GOAL #2  ? Title Pt will utilize recommended compensatory swallow strategies to optimize swallow safety during PO intake given occasional min A over 2 sessions   ? Baseline 09-10-21, 09-17-21   ? Status Achieved   ?  ? SLP SHORT TERM GOAL #3  ? Title Pt will complete loud /a/ or alternative with average of low 90s dB given occasional min A over 2 sessions   ? Baseline 09-10-21, 09-17-21   ? Status Achieved   ?  ? SLP SHORT TERM GOAL #4  ? Title Pt will utilize abdominal breathing on structured speech tasks with 80% accuracy given occasional min A over 2 sessions   ? Baseline 09-17-21   ? Time 1   ? Period Weeks   ? Status On-going   ?  ? SLP SHORT TERM GOAL #5  ? Title Pt will maintain WNL conversational volume (70-72 dB) in 10+ minute simple conversation  given occasional min A over 2 sessions   ? Time 1   ? Period Weeks   ? Status On-going   ? ?  ?  ? ?  ? ? ? SLP Long Term Goals - 09/24/21 1315   ? ?  ? SLP LONG TERM GOAL #1  ? Title Pt will complete recommended swallow exercise HEP to maximize swallow function given rare min A over 2 sessions   ? Time 5   ? Period Weeks   ? Status On-going   ?  ? SLP LONG TERM GOAL #2  ? Title Pt will utilize recommended compensatory swallow strategies to optimize swallow safety during PO intake given rare min A over 2 sessions   ? Time 5   ? Period Weeks   ? Status On-going   ?  ? SLP LONG TERM GOAL #3  ? Title Pt will maintain WNL conversational volume (70-72 dB) in 20+ minute mod complex conversation given occasional min A over 2 sessions   ? Time 9   ? Period Weeks   ? Status On-going   ?  ? SLP LONG TERM GOAL #4  ? Title Pt will report improved swallow function and communication effectiveness via PROM by 2 points at last ST session   ? Baseline Eat-10=16 & CPIB=12   ? Time 9   ? Period Weeks   ? Status On-going   ?  ? SLP LONG TERM GOAL #5  ? Title Pt will complete formal cognitive linguistic evaluation as warranted   ? Time 9   ? Period Weeks   ? Status On-going   ? ?  ?  ? ?  ? ? ? Plan - 09/24/21 1315   ? ? Clinical Impression Statement ?Shaunita? was referred by Dr. Carles Collet to address dysphagia secondary to Parkinson?s disease. Pt reports subjective improvements in swallow function with the endorsement "easier to swallow when I'm eating." Continued education and training of swallow exercises and recommended swallow precautions to maximize swallow function. Intermitttent rare min A required to aid completion for targeted swallow exercises and new recommended repetitions. Conducted ongoing education and training of dysarthria HEP, including abdominal breathing, sustained phonation, and targeted effort level. Occasional cues required to carryover compensations in conversation. Skilled ST is warranted to address dysphagia,  dysarthria, and possibly cognition to optimize functional independence and safety.   ? Speech Therapy Frequency 1x /week   pt elected for 1x/week  ? Duration 12 weeks   ?  Treatment/Interventions Compensatory strategies;Patient/family education;Functional tasks;Language facilitation;Compensatory techniques;Internal/external aids;SLP instruction and feedback;Multimodal communcation approach;Cognitive reorganization;Environmental controls;Diet toleration management by SLP;Aspiration precaution training;Pharyngeal strengthening exercises   ? Potential to Achieve Goals Good   ? Consulted and Agree with Plan of Care Patient   ? ?  ?  ? ?  ? ? ?Patient will benefit from skilled therapeutic intervention in order to improve the following deficits and impairments:   ?Dysphagia, oropharyngeal phase ? ?Dysarthria and anarthria ? ? ? ?Problem List ?Patient Active Problem List  ? Diagnosis Date Noted  ? Arthritis of carpometacarpal [facility]) joint of right thumb 12/21/2018  ? HLD (hyperlipidemia) 05/15/2015  ? Seizure ([place]) 05/15/2015  ? Has a tremor 05/15/2015  ? Parkinson disease ([place]) 05/15/2015  ? ? ?Marzetta Board, CCC-SLP ?09/24/2021, 1:59 PM ? ?[facility] ?[place] ?[place], [place], [address] ?Phone: [phone]   Fax:  [phone] ? ? ?Name: Chelsea Holm Calamia ?MRN: [id number] ?Date of Birth: 1950/12/24 ? ?

## 2021-10-01 ENCOUNTER — Ambulatory Visit: Payer: PPO

## 2021-10-01 ENCOUNTER — Other Ambulatory Visit: Payer: Self-pay

## 2021-10-01 DIAGNOSIS — R1312 Dysphagia, oropharyngeal phase: Secondary | ICD-10-CM

## 2021-10-01 DIAGNOSIS — R471 Dysarthria and anarthria: Secondary | ICD-10-CM

## 2021-10-01 NOTE — Therapy (Signed)
[facility] ?[place] ?[place], [place], [address] ?Phone: [phone]   Fax:  [phone] ? ?Speech Language Pathology Treatment ? ?Patient Details  ?Name: Chelsea Conley ?MRN: [id number] ?Date of Birth: 01-22-1951 ?Referring Provider (SLP): Tat, Eustace Quail, DO ? ? ?Encounter Date: 10/01/2021 ? ? End of Session - 10/01/21 1305   ? ? Visit Number 6   ? Number of Visits 13   ? Date for SLP Re-Evaluation 11/21/21   ? Authorization Type HTA $15 copay   ? SLP Start Time 1310   ? SLP Stop Time  1355   ? SLP Time Calculation (min) 45 min   ? Activity Tolerance Patient tolerated treatment well   ? ?  ?  ? ?  ? ? ?Past Medical History:  ?Diagnosis Date  ? History of seizures   ? History of tremor   ? Parkinson disease ([place]) 05/15/2015  ? ? ?Past Surgical History:  ?Procedure Laterality Date  ? FOOT SURGERY Bilateral   ? MOUTH SURGERY    ? X2  ? TONSILLECTOMY    ? ? ?There were no vitals filed for this visit. ? ? Subjective Assessment - 10/01/21 1309   ? ? Subjective "good"   ? Currently in Pain? No/denies   ? ?  ?  ? ?  ? ? ? ? ? ? ? ? ADULT SLP TREATMENT - 10/01/21 1305   ? ?  ? General Information  ? Behavior/Cognition Alert;Cooperative;Pleasant mood   ?  ? Treatment Provided  ? Treatment provided Dysphagia;Cognitive-Linquistic   ?  ? Dysphagia Treatment  ? Treatment Methods Therapeutic exercise;Patient/caregiver education   ? Patient observed directly with PO's Yes   ? Type of PO's observed Thin liquids   ? Feeding Able to feed self   ? Liquids provided via Straw   ? Amount of cueing Independent   ? Other treatment/comments Pt reported completion of 50 repetitions of Masako, effortful swallows, and CTAR per day with good success. Subjective improvements in swallow function indicated, in which pt believes swallowing boluses seems easier. Today, pt independently completed effortful swallows and Masako swallows x50 without cues.   ?  ? Cognitive-Linquistic  Treatment  ? Treatment focused on Dysarthria;Patient/family/caregiver education   ? Skilled Treatment SLP conducted ongoing education and training of dysarthria compensations. Loud /a/ completed with low 90s achieved. Initial cues required to optimize breath support and duration up to 10 seconds. Occasional fading to rare min A required to maintain intent and intensity throughout entire sentence and in conversation.   ?  ? Assessment / Recommendations / Plan  ? Plan Continue with current plan of care   ?  ? Progression Toward Goals  ? Progression toward goals Progressing toward goals   ? ?  ?  ? ?  ? ? ? SLP Education - 10/01/21 1319   ? ? Education Details review of targeted swallow exercises and dysarthria compensations   ? Person(s) Educated Patient   ? Methods Explanation;Demonstration   ? Comprehension Verbalized understanding;Returned demonstration   ? ?  ?  ? ?  ? ? ? SLP Short Term Goals - 10/01/21 1305   ? ?  ? SLP SHORT TERM GOAL #1  ? Title Pt will complete recommended swallow exercise HEP to maximize swallow function given occasional min A over 2 sessions   ? Baseline 09-10-21, 09-17-21   ? Status Achieved   ?  ? SLP SHORT TERM GOAL #2  ? Title Pt  will utilize recommended compensatory swallow strategies to optimize swallow safety during PO intake given occasional min A over 2 sessions   ? Baseline 09-10-21, 09-17-21   ? Status Achieved   ?  ? SLP SHORT TERM GOAL #3  ? Title Pt will complete loud /a/ or alternative with average of low 90s dB given occasional min A over 2 sessions   ? Baseline 09-10-21, 09-17-21   ? Status Achieved   ?  ? SLP SHORT TERM GOAL #4  ? Title Pt will utilize abdominal breathing on structured speech tasks with 80% accuracy given occasional min A over 2 sessions   ? Baseline 09-17-21, 10-01-21   ? Status Achieved   ?  ? SLP SHORT TERM GOAL #5  ? Title Pt will maintain WNL conversational volume (70-72 dB) in 10+ minute simple conversation given occasional min A over 2 sessions   ? Baseline  10-01-21   ? Status Partially Met   ? ?  ?  ? ?  ? ? ? SLP Long Term Goals - 10/01/21 1306   ? ?  ? SLP LONG TERM GOAL #1  ? Title Pt will complete recommended swallow exercise HEP to maximize swallow function given rare min A over 2 sessions   ? Baseline 10-01-21   ? Time 4   ? Period Weeks   ? Status On-going   ?  ? SLP LONG TERM GOAL #2  ? Title Pt will utilize recommended compensatory swallow strategies to optimize swallow safety during PO intake given rare min A over 2 sessions   ? Baseline 10-01-21   ? Time 4   ? Period Weeks   ? Status On-going   ?  ? SLP LONG TERM GOAL #3  ? Title Pt will maintain WNL conversational volume (70-72 dB) in 20+ minute mod complex conversation given occasional min A over 2 sessions   ? Baseline 10-01-21   ? Time 8   ? Period Weeks   ? Status On-going   ?  ? SLP LONG TERM GOAL #4  ? Title Pt will report improved swallow function and communication effectiveness via PROM by 2 points at last ST session   ? Baseline Eat-10=16 & CPIB=12   ? Time 8   ? Period Weeks   ? Status On-going   ?  ? SLP LONG TERM GOAL #5  ? Title Pt will complete formal cognitive linguistic evaluation as warranted   ? Time 8   ? Period Weeks   ? Status On-going   ? ?  ?  ? ?  ? ? ? Plan - 10/01/21 1305   ? ? Clinical Impression Statement ?Chelsea Conley? was referred by Dr. Carles Collet to address dysphagia secondary to Parkinson?s disease. Pt reports subjective improvements in swallow function with the endorsement "easier to swallow when I'm eating." Continued education and training of swallow exercises and recommended swallow precautions to maximize swallow function. No cues required to aid completion for targeted swallow exercises and new recommended repetitions. Conducted ongoing education and training of dysarthria HEP, including abdominal breathing, sustained phonation, and targeted effort level. Occasional fading to rare cues required to carryover compensations in conversation. Skilled ST is warranted to address dysphagia,  dysarthria, and possibly cognition to optimize functional independence and safety.   ? Speech Therapy Frequency 1x /week   pt elected for 1x/week  ? Duration 12 weeks   ? Treatment/Interventions Compensatory strategies;Patient/family education;Functional tasks;Language facilitation;Compensatory techniques;Internal/external aids;SLP instruction and feedback;Multimodal communcation approach;Cognitive reorganization;Environmental controls;Diet toleration management  by SLP;Aspiration precaution training;Pharyngeal strengthening exercises   ? Potential to Achieve Goals Good   ? Consulted and Agree with Plan of Care Patient   ? ?  ?  ? ?  ? ? ?Patient will benefit from skilled therapeutic intervention in order to improve the following deficits and impairments:   ?Dysarthria and anarthria ? ?Dysphagia, oropharyngeal phase ? ? ? ?Problem List ?Patient Active Problem List  ? Diagnosis Date Noted  ? Arthritis of carpometacarpal [facility]) joint of right thumb 12/21/2018  ? HLD (hyperlipidemia) 05/15/2015  ? Seizure ([place]) 05/15/2015  ? Has a tremor 05/15/2015  ? Parkinson disease ([place]) 05/15/2015  ? ? ?Marzetta Board, CCC-SLP ?10/01/2021, 1:54 PM ? ?[facility] ?[place] ?[place], [place], [address] ?Phone: [phone]   Fax:  [phone] ? ? ?Name: Chelsea Conley ?MRN: [id number] ?Date of Birth: 15-Oct-1950 ? ?

## 2021-10-02 DIAGNOSIS — H6982 Other specified disorders of Eustachian tube, left ear: Secondary | ICD-10-CM | POA: Diagnosis not present

## 2021-10-08 ENCOUNTER — Other Ambulatory Visit: Payer: Self-pay

## 2021-10-08 ENCOUNTER — Ambulatory Visit: Payer: PPO

## 2021-10-08 ENCOUNTER — Telehealth: Payer: Self-pay | Admitting: Neurology

## 2021-10-08 DIAGNOSIS — R1312 Dysphagia, oropharyngeal phase: Secondary | ICD-10-CM

## 2021-10-08 DIAGNOSIS — R471 Dysarthria and anarthria: Secondary | ICD-10-CM

## 2021-10-08 DIAGNOSIS — G20 Parkinson's disease: Secondary | ICD-10-CM

## 2021-10-08 MED ORDER — CARBIDOPA-LEVODOPA ER 25-100 MG PO TBCR: EXTENDED_RELEASE_TABLET | ORAL | 1 refills | Status: DC

## 2021-10-08 MED ORDER — CARBIDOPA-LEVODOPA ER 25-100 MG PO TBCR: EXTENDED_RELEASE_TABLET | ORAL | 0 refills | Status: DC

## 2021-10-08 NOTE — Telephone Encounter (Signed)
Called patient and have sent in prescription to  [facility] [place] and to [facility] to get patient through until the other can be delivered  ?

## 2021-10-08 NOTE — Therapy (Signed)
[facility] ?[place] ?[place], [place], [address] ?Phone: [phone]   Fax:  [phone] ? ?Speech Language Pathology Treatment/Discharge Summary ? ?Patient Details  ?Name: Chelsea Conley ?MRN: [id number] ?Date of Birth: 1950/12/25 ?Referring Provider (SLP): Tat, Eustace Quail, DO ? ? ?Encounter Date: 10/08/2021 ? ? End of Session - 10/08/21 1329   ? ? Visit Number 7   ? Number of Visits 13   ? Date for SLP Re-Evaluation 11/21/21   ? Authorization Type HTA $15 copay   ? SLP Start Time 1324   ? SLP Stop Time  1354   ? SLP Time Calculation (min) 30 min   ? Activity Tolerance Patient tolerated treatment well   ? ?  ?  ? ?  ? ? ?Past Medical History:  ?Diagnosis Date  ? History of seizures   ? History of tremor   ? Parkinson disease ([place]) 05/15/2015  ? ? ?Past Surgical History:  ?Procedure Laterality Date  ? FOOT SURGERY Bilateral   ? MOUTH SURGERY    ? X2  ? TONSILLECTOMY    ? ? ?There were no vitals filed for this visit. ? ? Subjective Assessment - 10/08/21 1326   ? ? Subjective "good"   ? Currently in Pain? No/denies   ? ?  ?  ? ?  ? ? ? ?SPEECH THERAPY DISCHARGE SUMMARY ? ?Visits from Start of Care: 7 ? ?Current functional level related to goals / functional outcomes: ??Chelsea Conley? presents with good carryover of targeted swallowing precautions and recommended exercises to maximize swallow function, optimize safety, and reduce aspiration risk. Improved swallow function subjectively reported. Pt has only elected to consume thin liquids in therapy with no overt s/sx of aspiration noted. Pt also exhibited improvements in vocal intensity with education and training of dysarthria compensations and HEP. Pt is pleased with current progress and agreeable to ST discharge this date. Pt aware of recommendation to continue swallow and speech exercises to maximize outcomes given progressive nature of PD.  ?  ?Remaining deficits: ?PD ?  ?Education / Equipment: ?Swallow  exercises, swallow precautions, dysarthria compensations and HEP ? ?Patient agrees to discharge. Patient goals were met. Patient is being discharged due to meeting the stated rehab goals.. ? ? ? ? ADULT SLP TREATMENT - 10/08/21 1326   ? ?  ? General Information  ? Behavior/Cognition Alert;Cooperative;Pleasant mood   ?  ? Treatment Provided  ? Treatment provided Dysphagia   ?  ? Dysphagia Treatment  ? Treatment Methods Patient/caregiver education   ? Amount of cueing Independent   ? Other treatment/comments Pt reported subjective improvements in swallow function (no recent episodes of difficulty compared to prior baseline) and continues to implement recommended compensatory strategies (small bites and slow rate) with success. EAT-10 self-rated score improved from 16 initially to 4 today (12 point improvement). Pt also reported improved carryover of increased vocal intensity, with less repetition requested by husband. Communication Participation Item Bank (CPIB) self-rated score improved from 12 to 21 (9 point improvement).   ?  ? Assessment / Recommendations / Plan  ? Plan Discharge SLP treatment due to (comment)   POC complete  ?  ? Progression Toward Goals  ? Progression toward goals Goals met, education completed, patient discharged from SLP   ? ?  ?  ? ?  ? ? ? SLP Education - 10/08/21 1346   ? ? Education Details discharge summary, SLP recommendations   ? Person(s) Educated Patient   ? Methods  Explanation;Demonstration   ? Comprehension Verbalized understanding;Returned demonstration   ? ?  ?  ? ?  ? ? ? SLP Short Term Goals - 10/01/21 1305   ? ?  ? SLP SHORT TERM GOAL #1  ? Title Pt will complete recommended swallow exercise HEP to maximize swallow function given occasional min A over 2 sessions   ? Baseline 09-10-21, 09-17-21   ? Status Achieved   ?  ? SLP SHORT TERM GOAL #2  ? Title Pt will utilize recommended compensatory swallow strategies to optimize swallow safety during PO intake given occasional min A over  2 sessions   ? Baseline 09-10-21, 09-17-21   ? Status Achieved   ?  ? SLP SHORT TERM GOAL #3  ? Title Pt will complete loud /a/ or alternative with average of low 90s dB given occasional min A over 2 sessions   ? Baseline 09-10-21, 09-17-21   ? Status Achieved   ?  ? SLP SHORT TERM GOAL #4  ? Title Pt will utilize abdominal breathing on structured speech tasks with 80% accuracy given occasional min A over 2 sessions   ? Baseline 09-17-21, 10-01-21   ? Status Achieved   ?  ? SLP SHORT TERM GOAL #5  ? Title Pt will maintain WNL conversational volume (70-72 dB) in 10+ minute simple conversation given occasional min A over 2 sessions   ? Baseline 10-01-21   ? Status Partially Met   ? ?  ?  ? ?  ? ? ? SLP Long Term Goals - 10/08/21 1330   ? ?  ? SLP LONG TERM GOAL #1  ? Title Pt will complete recommended swallow exercise HEP to maximize swallow function given rare min A over 2 sessions   ? Baseline 10-01-21, 10-08-21   ? Status Achieved   ?  ? SLP LONG TERM GOAL #2  ? Title Pt will utilize recommended compensatory swallow strategies to optimize swallow safety during PO intake given rare min A over 2 sessions   ? Baseline 10-01-21, 10-08-21   ? Status Achieved   ?  ? SLP LONG TERM GOAL #3  ? Title Pt will maintain WNL conversational volume (70-72 dB) in 20+ minute mod complex conversation given occasional min A over 2 sessions   ? Baseline 10-01-21, 10-08-21   ? Status Achieved   ?  ? SLP LONG TERM GOAL #4  ? Title Pt will report improved swallow function and communication effectiveness via PROM by 2 points at last ST session   ? Baseline Eat-10=16 & CPIB=12   ? Status Achieved   ?  ? SLP LONG TERM GOAL #5  ? Title Pt will complete formal cognitive linguistic evaluation as warranted   ? Status Deferred   ? ?  ?  ? ?  ? ? ? Plan - 10/08/21 1330   ? ? Clinical Impression Statement ?Chelsea Conley? was referred by Dr. Carles Collet to address dysphagia secondary to Parkinson?s disease. Pt reports subjective improvements in swallow function with the endorsement  "easier to swallow when I'm eating." Completed education and training of swallow exercises and recommended swallow precautions to maximize swallow function. Completed education and training of dysarthria HEP, including abdominal breathing, sustained phonation, and targeted effort level. Rare cues required to carryover compensations in conversation. Pt is pleased with current progress and agreeable to ST discharge this date. Scheduled ST/OT/PT screeners in 6-8 months.   ? Speech Therapy Frequency 1x /week   pt elected for 1x/week  ? Duration  12 weeks   ? Treatment/Interventions Compensatory strategies;Patient/family education;Functional tasks;Language facilitation;Compensatory techniques;Internal/external aids;SLP instruction and feedback;Multimodal communcation approach;Cognitive reorganization;Environmental controls;Diet toleration management by SLP;Aspiration precaution training;Pharyngeal strengthening exercises   ? Potential to Achieve Goals Good   ? Consulted and Agree with Plan of Care Patient   ? ?  ?  ? ?  ? ? ?Patient will benefit from skilled therapeutic intervention in order to improve the following deficits and impairments:   ?Dysphagia, oropharyngeal phase ? ?Dysarthria and anarthria ? ? ? ?Problem List ?Patient Active Problem List  ? Diagnosis Date Noted  ? Arthritis of carpometacarpal [facility]) joint of right thumb 12/21/2018  ? HLD (hyperlipidemia) 05/15/2015  ? Seizure ([place]) 05/15/2015  ? Has a tremor 05/15/2015  ? Parkinson disease ([place]) 05/15/2015  ? ? ?Marzetta Board, CCC-SLP ?10/08/2021, 1:49 PM ? ?[facility] ?[place] ?[place], [place], [address] ?Phone: [phone]   Fax:  [phone] ? ? ?Name: Chelsea Conley ?MRN: [id number] ?Date of Birth: 1950-07-18 ? ?

## 2021-10-08 NOTE — Telephone Encounter (Signed)
Patient called and stated she wants to change her prescription [facility] to [facility].  She states that she only has enough left for today.  She has some of the old left, wanted to know if she should try to take those until she can get the controlled release.   ?

## 2021-10-10 DIAGNOSIS — M810 Age-related osteoporosis without current pathological fracture: Secondary | ICD-10-CM | POA: Diagnosis not present

## 2021-10-10 DIAGNOSIS — G20 Parkinson's disease: Secondary | ICD-10-CM | POA: Diagnosis not present

## 2021-10-13 DIAGNOSIS — Z0000 Encounter for general adult medical examination without abnormal findings: Secondary | ICD-10-CM | POA: Diagnosis not present

## 2021-10-13 DIAGNOSIS — G20 Parkinson's disease: Secondary | ICD-10-CM | POA: Diagnosis not present

## 2021-10-15 ENCOUNTER — Telehealth: Payer: Self-pay | Admitting: Neurology

## 2021-10-15 NOTE — Telephone Encounter (Signed)
Chelsea Conley from [place] physician at [facility] was called back left a message letting her know that we have tried doing a tier exception and it was denied we have told the pt about the Good RX card and sent the pt prescription to [place] and pt has refused to use them. She was told that our Dr, Dr Tat stated we are not doing PA's nor tier exceptions on Carbidopa levodopa when they have these option to get them at the lower price because their insurance told them at the end of last year this was going to happen and they did not change their pan to cover their  medication ,  ?

## 2021-10-15 NOTE — Telephone Encounter (Signed)
Pt is taking carbidopa/levo. It is too expensive . Tat can submit an approval to lower tier. Last time it was done was [place]. ? ?Chelsea Griffins from [place] physician at [facility] ?

## 2021-10-22 DIAGNOSIS — G20 Parkinson's disease: Secondary | ICD-10-CM | POA: Diagnosis not present

## 2021-10-22 DIAGNOSIS — H04123 Dry eye syndrome of bilateral lacrimal glands: Secondary | ICD-10-CM | POA: Diagnosis not present

## 2021-10-23 DIAGNOSIS — H6982 Other specified disorders of Eustachian tube, left ear: Secondary | ICD-10-CM | POA: Diagnosis not present

## 2021-10-31 ENCOUNTER — Telehealth: Payer: Self-pay | Admitting: Neurology

## 2021-10-31 ENCOUNTER — Other Ambulatory Visit: Payer: Self-pay

## 2021-10-31 DIAGNOSIS — G20 Parkinson's disease: Secondary | ICD-10-CM

## 2021-10-31 MED ORDER — CARBIDOPA-LEVODOPA ER 25-100 MG PO TBCR: EXTENDED_RELEASE_TABLET | ORAL | 0 refills | Status: DC

## 2021-10-31 NOTE — Telephone Encounter (Signed)
Called pateint resent prescription to [facility] ?

## 2021-10-31 NOTE — Telephone Encounter (Signed)
Patient is calling about the RX she wants sent to [facility] [place]. Chelsea Conley its been about a month and has not heard anything. She wants all her meds sent there, but the one she is referring to is carbi/levo ?

## 2021-11-03 ENCOUNTER — Other Ambulatory Visit: Payer: Self-pay

## 2021-11-03 DIAGNOSIS — G20 Parkinson's disease: Secondary | ICD-10-CM

## 2021-11-03 MED ORDER — CARBIDOPA-LEVODOPA ER 25-100 MG PO TBCR: EXTENDED_RELEASE_TABLET | ORAL | 0 refills | Status: DC

## 2021-11-03 NOTE — Telephone Encounter (Signed)
Called patient and Meda Coffee discovered her name in system is causing the hold up . Going to try to get that changed for this patient  ?

## 2021-11-03 NOTE — Telephone Encounter (Signed)
Patient stated Chelsea Conley does not have an RX yet. She has a few things she needs to tell chelsea.  ?

## 2021-11-10 ENCOUNTER — Other Ambulatory Visit: Payer: Self-pay

## 2021-11-10 ENCOUNTER — Telehealth: Payer: Self-pay | Admitting: Neurology

## 2021-11-10 DIAGNOSIS — G20 Parkinson's disease: Secondary | ICD-10-CM

## 2021-11-10 MED ORDER — CARBIDOPA-LEVODOPA ER 25-100 MG PO TBCR: EXTENDED_RELEASE_TABLET | ORAL | 0 refills | Status: DC

## 2021-11-10 MED ORDER — AMANTADINE HCL 100 MG PO CAPS: ORAL_CAPSULE | ORAL | 0 refills | Status: DC

## 2021-11-10 MED ORDER — PROPRANOLOL HCL 20 MG PO TABS: 20.0000 mg | ORAL_TABLET | Freq: Two times a day (BID) | ORAL | 0 refills | Status: DC

## 2021-11-10 NOTE — Telephone Encounter (Signed)
Patient called with questions for the CMA about her prescriptions. ? ?[facility] is [facility] ? ?[facility] with the help of Meda Coffee ? ?She is not sure how to go about this. ? ? ?

## 2021-11-10 NOTE — Telephone Encounter (Signed)
Sent in refill for Amantadine propanolol and for Carbidopa levodopa Cr to [facility]  ?

## 2022-01-21 ENCOUNTER — Encounter: Payer: Self-pay | Admitting: Orthopaedic Surgery

## 2022-01-21 ENCOUNTER — Ambulatory Visit (INDEPENDENT_AMBULATORY_CARE_PROVIDER_SITE_OTHER): Payer: PPO

## 2022-01-21 ENCOUNTER — Ambulatory Visit: Payer: PPO | Admitting: Orthopaedic Surgery

## 2022-01-21 DIAGNOSIS — M542 Cervicalgia: Secondary | ICD-10-CM | POA: Diagnosis not present

## 2022-01-21 DIAGNOSIS — M1811 Unilateral primary osteoarthritis of first carpometacarpal joint, right hand: Secondary | ICD-10-CM | POA: Diagnosis not present

## 2022-01-21 MED ORDER — METHYLPREDNISOLONE ACETATE 40 MG/ML IJ SUSP
40.0000 mg | INTRAMUSCULAR | Status: AC | PRN
  Administered 2022-01-21: 40 mg

## 2022-01-21 MED ORDER — LIDOCAINE HCL 1 % IJ SOLN
1.0000 mL | INTRAMUSCULAR | Status: AC | PRN
  Administered 2022-01-21: 1 mL

## 2022-01-21 NOTE — Progress Notes (Signed)
Office Visit Note   Patient: Chelsea Conley           Date of Birth: Jun 16, 1951           MRN: [id number] Visit Date: 01/21/2022              Requested by: Orpah Melter, MD [address] [place],   [id number] PCP: Orpah Melter, MD   Assessment & Plan: Visit Diagnoses:  1. Neck pain   2. Arthritis of carpometacarpal (CMC) joint of right thumb     Plan: I did place a steroid injection around her basilar thumb joint as best I could given the profound arthritis.  She wants to try this treatment plan first.  My next step would be sending her to a hand specialist.  She will call and let us know.  Follow-Up Instructions: Return if symptoms worsen or fail to improve.   Orders:  Orders Placed This Encounter  Procedures   Hand/UE Inj   XR Cervical Spine 2 or 3 views   No orders of the defined types were placed in this encounter.     Procedures: Hand/UE Inj: R thumb CMC for osteoarthritis on 01/21/2022 11:45 AM Medications: 1 mL lidocaine 1 %; 40 mg methylPREDNISolone acetate 40 MG/ML      Clinical Data: No additional findings.   Subjective: Chief Complaint  Patient presents with   Neck - Pain   Right Hand - Pain  The patient is somewhat of seen in the past.  In 2020 we x-rayed her right hand and it showed severe end-stage arthritis of the basilar thumb joint/CMC joint of the right hand.  She has had a flareup of pain in that area again.  She is also been dealing with Parkinson disease.  She is 71 years old.  She has chronic neck pain.  I did see a MRI last year of her cervical spine was entirely normal.  She does see a neurologist regularly as well.  She reports some numbness in the tips of her index and middle finger as well.  HPI  Review of Systems There is currently no fever, chills, nausea, vomiting  Objective: Vital Signs: There were no vitals taken for this visit.  Physical Exam She is alert and orient x3 and in no acute distress Ortho  Exam Examination of her cervical spine shows stiffness with lateral rotation and bending to both sides.  Her pain is in the muscles on the lateral aspect of the sides of her neck and not in the midline.  Examination of her right hand shows profound arthritic changes at the basilar thumb joint of the right side which is not much different than when I saw her in 2020.  She does have subjective numbness in her hand and she is certainly weak with grip and pinch strength. Specialty Comments:  No specialty comments available.  Imaging: XR Cervical Spine 2 or 3 views  Result Date: 01/21/2022 2 views of the cervical spine show no acute findings.    PMFS History: Patient Active Problem List   Diagnosis Date Noted   Arthritis of carpometacarpal [facility]) joint of right thumb 12/21/2018   HLD (hyperlipidemia) 05/15/2015   Seizure ([place]) 05/15/2015   Has a tremor 05/15/2015   Parkinson disease ([place]) 05/15/2015   Past Medical History:  Diagnosis Date   History of seizures    History of tremor    Parkinson disease ([place]) 05/15/2015    Family History  Problem Relation Age of  Onset   Parkinson's disease Mother    Diabetes Father    Diabetes Sister    Diabetes Brother    Parkinson's disease Maternal Grandfather     Past Surgical History:  Procedure Laterality Date   FOOT SURGERY Bilateral    MOUTH SURGERY     X2   TONSILLECTOMY     Social History   Occupational History   Occupation: retired 09/2018    Employer: [employer]    Comment: [employer]  Tobacco Use   Smoking status: Never   Smokeless tobacco: Never  Substance and Sexual Activity   Alcohol use: Never   Drug use: No   Sexual activity: Not on file

## 2022-02-10 NOTE — Progress Notes (Unsigned)
Assessment/Plan:      Parkinsons Disease, sx's since at least 2015 - she doesn't like taking carbidopa/levodopa CR 1 po q 5 times per day but had nausea when taking it more tabs but less frequent -start rytary 195 mg, 1 po 8am/noon/4pm. -wean off propranolol due to fatigue/low BP -maternal grandfather both had Parkinson's disease.  Discussed the value of this type of testing.  She can think about that.  Discussed PDGeneration study    2.  Parkinson's dyskinesia             -Patient to continue amantadine, 100 mg 3 times per day.  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   3.  RBD             -Overall mild and no additional medication required.   4.  Cervical dystonia             -Is the likely source of the neck pain, but that is actually better, which is good because she was very nervous about the Botox.   5  dysphagia  -Modified barium evaluation done on August 13, 2021.  There was evidence of mild oropharyngeal dysphagia.  Regular diet with thin liquids recommended.  6.  Memory change  -discussed neurocog testing but they decided to hold on that for now  7.  Weight loss/insomnia  -in one month, start mirtazapine, 15 mg q hs Subjective:   Chelsea Conley was seen today in follow up for Parkinsons disease.  My previous records were reviewed prior to todays visit as well as outside records available to me.  Pt with husband who supplements hx. she had a modified barium swallow done since last visit and I did communicate with the therapist.  She felt patient was not at significant risk for aspiration.  She did recommend speech therapy for dysphagia and for hypophonia.  Patient did attend that therapy.  Patient saw orthopedics recently for neck pain and the physician described dystonic posturing.  It was unclear on his notes exactly what his plan was to be for the neck pain.  She had declined Botox previously.  We did  increase the patient's levodopa last visit because she was fairly significantly underdosed.  She reports today that her neck has been pain free lately.  She reports that she will get episodes where she is profoundly fatigued.  She is losing weight.  She is eating but husband states that she is eating like a bird.  Some trouble sleeping.    Current prescribed movement disorder medications: Carbidopa/levodopa 25/100 CR, 1 5 times daily (due to nausea) Propranolol 20 mg twice per day (originally started because she had a diagnosis of essential tremor) Amantadine, 100 mg 3 times per day  Prior medications: Carbidopa/levodopa IR (nausea/vomiting); selegiline (discontinued because of cost)  ALLERGIES:  No Known Allergies  CURRENT MEDICATIONS:  Current Meds  Medication Sig   amantadine (SYMMETREL) 100 MG capsule TAKE 1 CAPSULE BY MOUTH IN THE MORNING, AT NOON, AND AT BEDTIME.   Carbidopa-Levodopa ER (SINEMET CR) 25-100 MG tablet controlled release 2 at 8am/2 at noon/1 at 4pm (Patient taking differently: 2 at 8am/2 at noon/1 at 4pm  Taken every 2 hours takes 5 times a day)   propranolol (INDERAL) 20 MG tablet Take 1 tablet (20 mg total) by mouth 2 (two) times daily.     Objective:   PHYSICAL EXAMINATION:    VITALS:   Vitals:  02/12/22 0907  BP: 108/72  Pulse: 65  SpO2: 99%  Weight: 124 lb 6.4 oz (56.4 kg)  Height: '5\' 3"'$  (1.6 m)     GEN:  The patient appears stated age and is in NAD. HEENT:  Normocephalic, atraumatic.  The mucous membranes are moist. The superficial temporal arteries are without ropiness or tenderness. CV:  RRR Lungs:  CTAB Neck/HEME:  There are no carotid bruits bilaterally.  Neurological examination:  Orientation: The patient is alert and oriented x3. Cranial nerves: There is good facial symmetry with mild facial hypomimia. The speech is fluent and clear. Soft palate rises symmetrically and there is no tongue deviation. Hearing is intact to conversational  tone. Sensation: Sensation is intact to light touch throughout Motor: Strength is at least antigravity x4.   Movement examination: Tone: There is mild to moderate increased tone in left upper extremity.  Mild increased tone in the right upper extremity.  This is similar to last visit Abnormal movements: There is initially mild LUE rest tremor but by end of visit there was axial dyskinesia Coordination:  There is mild decremation with RAM's Gait and Station: The patient has no difficulty arising out of a deep-seated chair without the use of the hands.  She has some start hesitation, but once she gets walking, she does fairly well.  She is mildly flexed.    I have reviewed and interpreted the following labs independently    Chemistry      Component Value Date/Time   NA 143 11/21/2019 1046   K 4.8 11/21/2019 1046   CL 105 11/21/2019 1046   CO2 25 11/21/2019 1046   BUN 15 11/21/2019 1046   CREATININE 0.80 11/21/2019 1046      Component Value Date/Time   CALCIUM 9.8 11/21/2019 1046   ALKPHOS 120 (H) 11/21/2019 1046   AST 14 11/21/2019 1046   ALT 5 11/21/2019 1046   BILITOT 0.4 11/21/2019 1046       Lab Results  Component Value Date   WBC 5.0 05/10/2008   HGB 14.1 05/10/2008   HCT 42.3 05/10/2008   MCV 91.0 05/10/2008   PLT 255 05/10/2008    No results found for: "TSH"   Total time spent on today's visit was 42 minutes, including both face-to-face time and nonface-to-face time.  Time included that spent on review of records (prior notes available to me/labs/imaging if pertinent), discussing treatment and goals, answering patient's questions and coordinating care.  Cc:  Orpah Melter, MD

## 2022-02-12 ENCOUNTER — Ambulatory Visit: Payer: PPO | Admitting: Neurology

## 2022-02-12 ENCOUNTER — Encounter: Payer: Self-pay | Admitting: Neurology

## 2022-02-12 VITALS — BP 108/72 | HR 65 | Ht 63.0 in | Wt 124.4 lb

## 2022-02-12 DIAGNOSIS — G249 Dystonia, unspecified: Secondary | ICD-10-CM | POA: Diagnosis not present

## 2022-02-12 DIAGNOSIS — G20 Parkinson's disease: Secondary | ICD-10-CM | POA: Diagnosis not present

## 2022-02-12 DIAGNOSIS — G4709 Other insomnia: Secondary | ICD-10-CM

## 2022-02-12 MED ORDER — RYTARY 48.75-195 MG PO CPCR: ORAL_CAPSULE | ORAL | 1 refills | Status: DC

## 2022-02-12 MED ORDER — MIRTAZAPINE 15 MG PO TABS: 15.0000 mg | ORAL_TABLET | Freq: Every day | ORAL | 1 refills | Status: DC

## 2022-02-12 NOTE — Patient Instructions (Addendum)
STOP carbidopa/levodopa CR START RYTARY 195, 1 capsule at 8 am, noon, 4pm DECREASE propranolol 20 mg daily for 1 week and then STOP propranolol IN ONE month, start mirtazapine, 15 mg at bedtime (for sleep)  Local and Online Resources for Power over Parkinson's Group June 2023  [place] over Parkinson's Group:   Power Over Parkinson's Patient Education Group will be Wednesday, June 14th-*Hybrid meting*- in person at [place] location and via [facility] at 2:00 pm.   Upcoming Power over [employer] Meetings:  2nd Wednesdays of the month at 2 pm:   June 14th, July 12th Contact Amy Marriott at [email or web address] if interested in participating in this group Parkinson's Care Partners Group:    3rd Mondays, Contact Misty Paladino Atypical Parkinsonian Patient Group:   4th Wednesdays, [place] If you are interested in participating in these groups with Misty, please contact her directly for how to join those meetings.  Her contact information is [email or web address].    LOCAL EVENTS AND NEW OFFERINGS Dance Class for People with Parkinson's at [facility].  Friday, June 9th at 2 pm.  Dora Sims by Tyler Deis DPT students.  Contact [email or web address] to register or with questions. Ice Cream Social at [place]!  Thursday, June 15th, 5:30-7:00 pm.  RSVP to [email or web address] for attendance and free ice cream. Parkinson's T-shirts for sale!  Designed by a local group member, with funds going to [place].  $25.00  Investment banker, corporate to purchase  [employer]! Moves Dynegy Instructor-Led Class offering at [employer]!  Wednesdays 1-2 pm, starting April 12th.   Contact Bryson Dames, Acupuncturist at [employer].  [email or web address]  [place]:  [email or web address] PD Health at Home continues:  Mindfulness Mondays, Wellness Wednesdays, Fitness Fridays  Upcoming  Education: Parkinson's 101:  What You and Your Family Should Know.  Wednesday, June 7th at 1:00 pm Register for expert briefings (webinars) at [email or web address] Please check out their website to sign up for emails and see their full online offerings   [place]:  [email or web address]  Third Thursday Webinars:  On the third Thursday of every month at 12 p.m. ET, join our free live webinars to learn about various aspects of living with Parkinson's disease and our work to speed medical breakthroughs. Upcoming Webinar: REPLAY:  From Low Blood Pressure to Bladder Problems:  A Look at Lesser Known Parkinson's Symptoms.  Thursday, June 15th at 12 noon. Check out additional information on their website to see their full online offerings  [facility]:  [email or web address] Upcoming Webinar:   Stay tuned Webinar Series:  Living with Parkinson's Meetup.   Third Thursdays each month, 3 pm Care Partner Monthly Meetup.  With Robin Searing Phinney.  First Tuesday of each month, 2 pm Check out additional information to Live Well Today on their website  Parkinson and Movement Disorders (PMD) Alliance:  www.pmdalliance.org [employer] Online:  Online Education Events Sign up for emails, which are sent weekly to give you updates on programming and online offerings  Parkinson's Association of the [place]:  www.[email or web address] Information on online support groups, education events, and online exercises including Yoga, Parkinson's exercises and more-LOTS of information on links to PD resources and online events Virtual Support Group through Parkinson's Association of the [place]; next one is scheduled for Wednesday, June 7th at 2 pm. (These are typically scheduled for the 1st Wednesday of the month at 2 pm).  Visit website for details. Save the date for "Caring for Parkinson's-Caring for  You", 9th  Annual Symposium.  In-person event in [place].  September 9th.  More info on registration to come. MOVEMENT AND EXERCISE OPPORTUNITIES PWR! Moves Classes at [place].  Wednesdays 10 and 11 am.   Contact Amy Marriott, PT [email or web address] if interested. NEW PWR! Moves Class offering at [employer].  Wednesdays 1-2 pm, starting April 12th.  Contact Bryson Dames, Acupuncturist at [employer].  [email or web address] Here is a link to the PWR!Moves classes on Zoom from [place] - Daily Mon-Sat at 10:00. Via Zoom, FREE and open to all.  There is also a link below via Facebook if you use that platform.  [email or web address].si [email or web address]  Parkinson's Wellness Recovery (PWR! Moves)  www.[email or web address] Info on the PWR! Virtual Experience:  You will have access to our expertise through self-assessment, guided plans that start with the PD-specific fundamentals, educational content, tips, Q&A with an expert, and a growing Art therapist of PD-specific pre-recorded and live exercise classes of varying types and intensity - both physical and cognitive! If that is not enough, we offer 1:1 wellness consultations (in-person or virtual) to personalize your PWR! Research scientist (medical).  [place] Fridays:  As part of the PD Health @ Home program, this free video series focuses each week on one aspect of fitness designed to support people living with Parkinson's.  These weekly videos highlight the [place] recent fitness guidelines for people with Parkinson's disease. ModemGamers.si Dance for PD website is offering free, live-stream classes throughout the week, as well as links to [employer] of classes:   [email or web address]/ Virtual dance and Pilates for Parkinson's classes: Click on the Community Tab> Parkinson's Movement Initiative Tab.  To register for classes and for more information, visit www.[email or web address] and click the "community" tab.  [employer] Parkinson's Cycling Classes  [employer]:  Thursdays @ Noon-Live classes at [employer] ([employer] at [email or web address] or [phone]) [employer]: Virtual Classes Mondays and Thursdays Jeanette Caprice classes Tuesday, Wednesday and Thursday (contact [place] at [email or web address]  or [phone]) Dante Varied levels of classes are offered Tuesdays and Thursdays at [employer].  Stretching with Verdis Frederickson weekly class is also offered for people with Parkinson's To observe a class or for more information, call [phone] or email Hezzie Bump at [email or web address] ADDITIONAL SUPPORT AND RESOURCES Well[employer]Spring [employer]:Online Caregiver Education Opportunities:  www.well-springsolutions.org/caregiver-education/caregiver-support-group.  You may also contact Vickki Muff at [email or web address] or [phone].    Well-Spring Navigator:  [phone] program, a free service to help individuals and families through the journey of determining care for older adults.  The "Navigator" is a [employer], Education officer, museum, who will speak with a prospective client and/or loved ones to provide an assessment of the situation and a set of recommendations for a personalized care plan -- all free of charge, and whether Well-Spring [employer] offers the needed service or not. If the need is not a service we provide, we are well-connected with reputable programs in town that we can refer you to.  www.well-springsolutions.org or to speak with the Navigator, call [phone]. Family Caregiver Programming in June:  [employer] Against [employer]ud, Thursday, June 15th 11-12:30 at 08-[birth year]-1971. [place].  Call [phone] to register

## 2022-02-12 NOTE — Progress Notes (Signed)
Medication Samples have been provided to the patient.  Drug name: rytary       Strength: '195mg'$         Qty: '195mg'$   LOT: [id number]  Exp.Date: 8/23  Dosing instructions: use as directed   The patient has been instructed regarding the correct time, dose, and frequency of taking this medication, including desired effects and most common side effects.

## 2022-02-18 ENCOUNTER — Telehealth: Payer: Self-pay | Admitting: Neurology

## 2022-02-18 NOTE — Telephone Encounter (Signed)
Pt would like to know if she is to stop taking amantadine or continue taking it

## 2022-02-18 NOTE — Telephone Encounter (Signed)
Looked at Dr. Arturo Morton last notes and they state 2.  Parkinson's dyskinesia             -Patient to continue amantadine, 100 mg 3 times per day.  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 Patient understood and had no questions at this time

## 2022-02-24 ENCOUNTER — Telehealth: Payer: Self-pay | Admitting: Neurology

## 2022-02-24 ENCOUNTER — Other Ambulatory Visit: Payer: Self-pay

## 2022-02-24 DIAGNOSIS — G20 Parkinson's disease: Secondary | ICD-10-CM

## 2022-02-24 MED ORDER — RYTARY 48.75-195 MG PO CPCR: ORAL_CAPSULE | ORAL | 0 refills | Status: DC

## 2022-02-24 NOTE — Telephone Encounter (Signed)
Patient called about the Rytary.  She stated the [facility] does not have them. She wanted to see if she can get more samples of them.

## 2022-03-06 DIAGNOSIS — M810 Age-related osteoporosis without current pathological fracture: Secondary | ICD-10-CM | POA: Diagnosis not present

## 2022-03-06 DIAGNOSIS — G20 Parkinson's disease: Secondary | ICD-10-CM | POA: Diagnosis not present

## 2022-03-18 ENCOUNTER — Telehealth: Payer: Self-pay | Admitting: Neurology

## 2022-03-18 NOTE — Telephone Encounter (Signed)
Pt would like a call back about amantadine '100mg'$ . She needs refills on this medicine.  [facility]  Suppose to start mirtazapine-she would like to know if Tat minds if she doesn't start this right now.

## 2022-03-19 ENCOUNTER — Other Ambulatory Visit: Payer: Self-pay

## 2022-03-19 DIAGNOSIS — G20 Parkinson's disease: Secondary | ICD-10-CM

## 2022-03-19 MED ORDER — AMANTADINE HCL 100 MG PO CAPS: ORAL_CAPSULE | ORAL | 0 refills | Status: DC

## 2022-03-19 NOTE — Telephone Encounter (Signed)
Called patient and she is going to start the Mirtazapine again and let us know if it is helping her. I have sent I [facility] to [facility] [place]

## 2022-05-05 ENCOUNTER — Ambulatory Visit: Payer: PPO | Admitting: Speech Pathology

## 2022-05-05 ENCOUNTER — Ambulatory Visit: Payer: PPO | Admitting: Occupational Therapy

## 2022-05-19 DIAGNOSIS — Z1211 Encounter for screening for malignant neoplasm of colon: Secondary | ICD-10-CM | POA: Diagnosis not present

## 2022-05-19 DIAGNOSIS — K5900 Constipation, unspecified: Secondary | ICD-10-CM | POA: Diagnosis not present

## 2022-05-19 DIAGNOSIS — R634 Abnormal weight loss: Secondary | ICD-10-CM | POA: Diagnosis not present

## 2022-06-01 ENCOUNTER — Telehealth: Payer: Self-pay | Admitting: Orthopaedic Surgery

## 2022-06-01 NOTE — Telephone Encounter (Signed)
Received call from patient. She has an upcoming appt. With with Dr. Burney Gauze and needs records sent to his office. She will come by to sigh authorization and I will fax records upon receipt of auth.

## 2022-06-16 DIAGNOSIS — M19041 Primary osteoarthritis, right hand: Secondary | ICD-10-CM | POA: Diagnosis not present

## 2022-06-16 DIAGNOSIS — M79641 Pain in right hand: Secondary | ICD-10-CM | POA: Diagnosis not present

## 2022-06-25 DIAGNOSIS — G20A1 Parkinson's disease without dyskinesia, without mention of fluctuations: Secondary | ICD-10-CM | POA: Diagnosis not present

## 2022-06-25 DIAGNOSIS — M810 Age-related osteoporosis without current pathological fracture: Secondary | ICD-10-CM | POA: Diagnosis not present

## 2022-06-29 ENCOUNTER — Other Ambulatory Visit: Payer: Self-pay

## 2022-06-29 ENCOUNTER — Telehealth: Payer: Self-pay | Admitting: Anesthesiology

## 2022-06-29 DIAGNOSIS — G20B1 Parkinson's disease with dyskinesia, without mention of fluctuations: Secondary | ICD-10-CM

## 2022-06-29 DIAGNOSIS — G20A1 Parkinson's disease without dyskinesia, without mention of fluctuations: Secondary | ICD-10-CM

## 2022-06-29 MED ORDER — AMANTADINE HCL 100 MG PO CAPS: ORAL_CAPSULE | ORAL | 0 refills | Status: DC

## 2022-06-29 NOTE — Telephone Encounter (Signed)
Sent prescription and called and left patient message

## 2022-06-29 NOTE — Telephone Encounter (Signed)
Pt called requesting a refill on her medication  1. Which medications need refilled? Amantadine 100 mg  2. Which pharmacy/location is medication to be sent to? [facility]   3. Do they need a 30 day or 90 day supply? 90 day supply

## 2022-07-15 DIAGNOSIS — K648 Other hemorrhoids: Secondary | ICD-10-CM | POA: Diagnosis not present

## 2022-07-15 DIAGNOSIS — R634 Abnormal weight loss: Secondary | ICD-10-CM | POA: Diagnosis not present

## 2022-07-15 DIAGNOSIS — D122 Benign neoplasm of ascending colon: Secondary | ICD-10-CM | POA: Diagnosis not present

## 2022-07-15 DIAGNOSIS — Z1211 Encounter for screening for malignant neoplasm of colon: Secondary | ICD-10-CM | POA: Diagnosis not present

## 2022-07-15 DIAGNOSIS — Z86010 Personal history of colonic polyps: Secondary | ICD-10-CM | POA: Diagnosis not present

## 2022-07-15 DIAGNOSIS — K635 Polyp of colon: Secondary | ICD-10-CM | POA: Diagnosis not present

## 2022-07-22 ENCOUNTER — Telehealth: Payer: Self-pay | Admitting: Neurology

## 2022-07-22 NOTE — Telephone Encounter (Signed)
Pt stated she has been taking Rytary that she was given samples, but she doesn't have enough to get her to her 08/18/22 appointment. She also says she is applying for the patient assistance program for the Rytary since it's so expensive. The office will have to fill out part of that paperwork and she will bring that in soon.

## 2022-07-23 ENCOUNTER — Other Ambulatory Visit: Payer: Self-pay

## 2022-07-23 MED ORDER — RYTARY 48.75-195 MG PO CPCR
ORAL_CAPSULE | ORAL | 0 refills | Status: DC
Start: 2022-07-23 — End: 2022-08-18

## 2022-07-23 NOTE — Telephone Encounter (Signed)
Called patient she will be bringing Rytary paperwork up for [place] to complete and I let her know we can provide a few more samples until she can get the assistance program up and going

## 2022-07-24 ENCOUNTER — Other Ambulatory Visit: Payer: Self-pay

## 2022-07-24 DIAGNOSIS — G20B1 Parkinson's disease with dyskinesia, without mention of fluctuations: Secondary | ICD-10-CM

## 2022-07-24 DIAGNOSIS — G20C Parkinsonism, unspecified: Secondary | ICD-10-CM | POA: Diagnosis not present

## 2022-07-24 DIAGNOSIS — M810 Age-related osteoporosis without current pathological fracture: Secondary | ICD-10-CM | POA: Diagnosis not present

## 2022-07-24 MED ORDER — RYTARY 48.75-195 MG PO CPCR: ORAL_CAPSULE | ORAL | 1 refills | Status: DC

## 2022-07-29 DIAGNOSIS — M1811 Unilateral primary osteoarthritis of first carpometacarpal joint, right hand: Secondary | ICD-10-CM | POA: Diagnosis not present

## 2022-07-29 DIAGNOSIS — M654 Radial styloid tenosynovitis [de Quervain]: Secondary | ICD-10-CM | POA: Diagnosis not present

## 2022-07-30 NOTE — Telephone Encounter (Signed)
Wells Guiles is calling from patients primary care office, inquiring if we have completed our section of the PAP paperwork. Once completed can we fax it to [phone].

## 2022-07-30 NOTE — Telephone Encounter (Signed)
Called Chelsea Conley from [place] for clarification

## 2022-08-03 ENCOUNTER — Other Ambulatory Visit: Payer: Self-pay

## 2022-08-03 ENCOUNTER — Telehealth: Payer: Self-pay

## 2022-08-03 NOTE — Telephone Encounter (Signed)
Caren Griffins from [place] is callign in asking if we have completed the Patient Assistance paperwork. They would like our copy faxed over so they can fill out their portion instead of faxing over multiple forms to the company they fill it after [place].

## 2022-08-03 NOTE — Telephone Encounter (Signed)
Called [facility] for the second time and I have already faxed my paperwork as I had left on the message when I called the first time

## 2022-08-03 NOTE — Telephone Encounter (Signed)
MEDICATION: mirtazapine (REMERON) 15 MG tablet  // amantadine (SYMMETREL) 100 MG capsule   PHARMACY:[facility]    Comments:   **Let patient know to contact pharmacy at the end of the day to make sure medication is ready. **  ** Please notify patient to allow 48-72 hours to process**  **Encourage patient to contact the pharmacy for refills or they can request refills through [facility]**

## 2022-08-04 ENCOUNTER — Other Ambulatory Visit: Payer: Self-pay

## 2022-08-04 DIAGNOSIS — G20A1 Parkinson's disease without dyskinesia, without mention of fluctuations: Secondary | ICD-10-CM

## 2022-08-04 DIAGNOSIS — G20B1 Parkinson's disease with dyskinesia, without mention of fluctuations: Secondary | ICD-10-CM

## 2022-08-04 MED ORDER — MIRTAZAPINE 15 MG PO TABS: 15.0000 mg | ORAL_TABLET | Freq: Every day | ORAL | 1 refills | Status: DC

## 2022-08-04 MED ORDER — AMANTADINE HCL 100 MG PO CAPS: ORAL_CAPSULE | ORAL | 0 refills | Status: DC

## 2022-08-04 NOTE — Telephone Encounter (Signed)
Faxed PAP forms to PCP

## 2022-08-04 NOTE — Telephone Encounter (Signed)
Prescriptions sent to [facility] patient has changed pharmacies

## 2022-08-10 DIAGNOSIS — M25641 Stiffness of right hand, not elsewhere classified: Secondary | ICD-10-CM | POA: Diagnosis not present

## 2022-08-10 DIAGNOSIS — M25541 Pain in joints of right hand: Secondary | ICD-10-CM | POA: Diagnosis not present

## 2022-08-10 DIAGNOSIS — M19041 Primary osteoarthritis, right hand: Secondary | ICD-10-CM | POA: Diagnosis not present

## 2022-08-17 DIAGNOSIS — G20C Parkinsonism, unspecified: Secondary | ICD-10-CM | POA: Diagnosis not present

## 2022-08-17 DIAGNOSIS — M810 Age-related osteoporosis without current pathological fracture: Secondary | ICD-10-CM | POA: Diagnosis not present

## 2022-08-17 NOTE — Progress Notes (Signed)
Assessment/Plan:      Parkinsons Disease, sx's since at least 2015 -Continue rytary 195 mg, 1 po 8am/noon/4pm.  She is slightly underdosed, but she is very happy with Rytary, so we decided to continue with this dose for now.  Samples provided today -maternal grandfather both had Parkinson's disease.  Discussed the value of this type of testing.  She can think about that.  Discussed PDGeneration study    2.  Parkinson's dyskinesia             -Patient to continue amantadine, 100 mg 3 times per day.  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   3.  RBD             -Overall mild and no additional medication required.   4.  Cervical dystonia             -Is the likely source of the neck pain, but that is actually better, which is good because she was very nervous about the Botox.   5  dysphagia  -Modified barium evaluation done on August 13, 2021.  There was evidence of mild oropharyngeal dysphagia.  Regular diet with thin liquids recommended.  6.  Memory change  -discussed neurocog testing but they decided to hold on that for now  7.  Weight loss/insomnia  -she will hold mirtazapine week and let me know how she does.  She thought it was causing nightmares and active dreams, but my guess would be that this was the disease and not the medication. Subjective:   Chelsea Conley was seen today in follow up for Parkinsons disease.  My previous records were reviewed prior to todays visit as well as outside records available to me.  Pt with husband who supplements hx. last visit, we decided to start rytary to see if it would help the nausea.  She is no longer having nausea and "its been life changing."  We also are going to start mirtazapine for the weight loss and insomnia, but she called me about a month after I saw her stating that she really did not want to take the medication.  It turns out, however, that she did end up  starting this.  She is having crazy dreams now and wonders if the medication is causing it.   We stopped her propranolol last visit.  Current prescribed movement disorder medications: Rytary 195, 1 tablet at 8 AM/noon/4 PM Amantadine, 100 mg 3 times per day  Prior medications: Carbidopa/levodopa IR (nausea/vomiting); selegiline (discontinued because of cost); carbidopa/levodopa CR (still with some nausea)  ALLERGIES:  No Known Allergies  CURRENT MEDICATIONS:  Current Meds  Medication Sig   amantadine (SYMMETREL) 100 MG capsule TAKE 1 CAPSULE BY MOUTH IN THE MORNING, AT NOON, AND AT BEDTIME.   Carbidopa-Levodopa ER (RYTARY) 48.75-195 MG CPCR 1 capsule three times daily at 8am/noon/4pm   mirtazapine (REMERON) 15 MG tablet Take 1 tablet (15 mg total) by mouth at bedtime.   [DISCONTINUED] Carbidopa-Levodopa ER (RYTARY) 48.75-195 MG CPCR Samples of this drug were given to the patient, quantity 3, Lot Number [id number] Av Exp 08/23;e   [DISCONTINUED] Carbidopa-Levodopa ER (RYTARY) 48.75-195 MG CPCR Samples of this drug were given to the patient, quantity 3, Lot Number [id number] 10/24     Objective:   PHYSICAL EXAMINATION:    VITALS:   Vitals:   08/18/22 1512  BP: 118/78  Pulse: 81  SpO2: 97%  Weight: 134 lb  12.8 oz (61.1 kg)  Height: '5\' 3"'$  (1.6 m)   Wt Readings from Last 3 Encounters:  08/18/22 134 lb 12.8 oz (61.1 kg)  02/12/22 124 lb 6.4 oz (56.4 kg)  07/31/21 136 lb (61.7 kg)      GEN:  The patient appears stated age and is in NAD. HEENT:  Normocephalic, atraumatic.  The mucous membranes are moist. The superficial temporal arteries are without ropiness or tenderness. CV:  RRR Lungs:  CTAB Neck/HEME:  There are no carotid bruits bilaterally.  Neurological examination:  Orientation: The patient is alert and oriented x3. Cranial nerves: There is good facial symmetry with mild facial hypomimia. The speech is fluent and clear. Soft palate rises symmetrically and there is no  tongue deviation. Hearing is intact to conversational tone. Sensation: Sensation is intact to light touch throughout Motor: Strength is at least antigravity x4.   Movement examination: Tone: There is mild to moderate increased tone in left upper extremity.  Mild increased tone in the right upper extremity.  This is similar to last visit Abnormal movements: There is initially min LUE rest tremor;  there is rare L foot tremor Coordination:  There is no decremation but she has trouble with finger taps on the right due to recent thumb sx Gait and Station: The patient has no difficulty arising out of a deep-seated chair without the use of the hands.  She ambulates well.   She is mildly flexed.    I have reviewed and interpreted the following labs independently    Chemistry      Component Value Date/Time   NA 143 11/21/2019 1046   K 4.8 11/21/2019 1046   CL 105 11/21/2019 1046   CO2 25 11/21/2019 1046   BUN 15 11/21/2019 1046   CREATININE 0.80 11/21/2019 1046      Component Value Date/Time   CALCIUM 9.8 11/21/2019 1046   ALKPHOS 120 (H) 11/21/2019 1046   AST 14 11/21/2019 1046   ALT 5 11/21/2019 1046   BILITOT 0.4 11/21/2019 1046       Lab Results  Component Value Date   WBC 5.0 05/10/2008   HGB 14.1 05/10/2008   HCT 42.3 05/10/2008   MCV 91.0 05/10/2008   PLT 255 05/10/2008    No results found for: "TSH"   Total time spent on today's visit was 24 minutes, including both face-to-face time and nonface-to-face time.  Time included that spent on review of records (prior notes available to me/labs/imaging if pertinent), discussing treatment and goals, answering patient's questions and coordinating care.  Cc:  Orpah Melter, MD

## 2022-08-18 ENCOUNTER — Ambulatory Visit: Payer: PPO | Admitting: Neurology

## 2022-08-18 ENCOUNTER — Encounter: Payer: Self-pay | Admitting: Neurology

## 2022-08-18 VITALS — BP 118/78 | HR 81 | Ht 63.0 in | Wt 134.8 lb

## 2022-08-18 DIAGNOSIS — G20B2 Parkinson's disease with dyskinesia, with fluctuations: Secondary | ICD-10-CM | POA: Diagnosis not present

## 2022-08-18 MED ORDER — RYTARY 48.75-195 MG PO CPCR: ORAL_CAPSULE | ORAL | 0 refills | Status: DC

## 2022-08-18 NOTE — Patient Instructions (Signed)
HOLD mirtazapine for a week and then email or call me and let me know how you did with dreams and appetite  The physicians and staff at [facility] are committed to providing excellent care. You may receive a survey requesting feedback about your experience at our office. We strive to receive "very good" responses to the survey questions. If you feel that your experience would prevent you from giving the office a "very good " response, please contact our office to try to remedy the situation. We may be reached at [phone]. Thank you for taking the time out of your busy day to complete the survey.  Local and Online Resources for Power over Parkinson's Group  January 2024   LOCAL [place] PARKINSON'S GROUPS   Power over Parkinson's Group:    Power Over Parkinson's Patient Education Group will be Wednesday, January 10th-*Hybrid meting*- in person at [facility] location and via [facility], 2:00-3:00 pm.   Starting in November, Power over [employer] and Care Partner Groups will meet together, with plans for separate break out session for caregivers (*this will be evolving over the next few months) Upcoming Power over Parkinson's Meetings/Care Partner Support:  2nd Wednesdays of the month at 2 pm:   January 10th, February 14th [place] at [email or web address] if interested in participating in this group    [place]! Moves Dynegy Instructor-Led Classes offering at [employer]!  TUESDAYS and Wednesdays 1-2 pm.   Contact Vonna Kotyk at  [email or web address]  or [phone] (Tuesday classes are modified for chair and standing only) Drumming for Parkinson's will be held on 2nd and 4th Mondays at 11:00 am.   Located at the [place] ([place]  Contact Doylene Canning at [email or web address] or [place] Class, Mondays at  11 am.  Call [phone] for details Let's Try Pickleball-$25 for 6 weeks of Pickleball in [place].  Contact Corwin Levins for more details and for dates.  [email or web address] SAVE THE DATE and REGISTER:  [place] Chapter of Parkinson's Foundation:  Parkinson's Symposium.  Conversations about Parkinson's.  Saturday, September 12, 2022, 9:00 am-2:00 pm.  [place], *In person or online via [place]*.  Register at [email or web address] or call Beverlee Nims at [phone].   [place]:  [email or web address]  PD Health at Home continues:  Mindfulness Mondays, Wellness Wednesdays, Fitness Fridays   Upcoming Education:    Environmental health practitioner your Voice:  Air cabin crew. Wednesday, January 3rd,  1-2 pm  Managing Weight Loss & Retaining Muscle Mass.  Wednesday, Jan 10th, 1-2 pm Changes in Speech and Voice.  Wednesday, January 17th, 1-2 pm Register for virtual education and Patent attorney (webinars) at [email or web address] Please check out their website to sign up for emails and see their full online offerings      [place]:  [email or web address]   Third Thursday Webinars:  On the third Thursday of every month at 12 p.m. ET, join our free live webinars to learn about various aspects of living with Parkinson's disease and our work to speed medical breakthroughs.  Upcoming Webinar:  New Year, New Moves!  Explore Exercise for Life with Parkinson's.  Thursday, January 18th at 12 noon. Check out additional information on their website to see their full online offerings    [facility]:  [email or web address]  Upcoming Webinar:   Physical Therapy and Parkinson's.  Thursday, January  11th, 2 pm  Webinar Series:  Living with Parkinson's Meetup.   Third Thursdays each month, 3 pm  Care Partner Monthly Meetup.  With Robin Searing Phinney.  First  Tuesday of each month, 2 pm  Check out additional information to Live Well Today on their website    Parkinson and Movement Disorders (PMD) Alliance:  www.[email or web address]  [employer]Life Online:  Online Education Events  Sign up for emails, which are sent weekly to give you updates on programming and online offerings    Parkinson's Association of [place]:  www.[email or web address]  Information on online support groups, education events, and online exercises including Yoga, Parkinson's exercises and more-LOTS of information on links to PD resources and online events  Virtual Support Group through Parkinson's Association of the [place]; next one is scheduled for Wednesday, Feb 7th  MOVEMENT AND EXERCISE OPPORTUNITIES  PWR! Moves Classes at [place].  Wednesdays 10 and 11 am.   Contact Amy Marriott, PT [email or web address] if interested.  NEW PWR! Moves Class offerings at [employer].  *TUESDAYS* and Wednesdays 1-2 pm.    Contact Vonna Kotyk at  [email or web address]    Parkinson's Wellness Recovery (PWR! Moves)  www.[email or web address]  Info on the PWR! Virtual Experience:  You will have access to our expertise?through self-assessment, guided plans that start with the PD-specific fundamentals, educational content, tips, Q&A with an expert, and a growing Art therapist of PD-specific pre-recorded and live exercise classes of varying types and intensity - both physical and cognitive! If that is not enough, we offer 1:1 wellness consultations (in-person or virtual) to personalize your PWR! Research scientist (medical).   [place] Fridays:   As part of the PD Health @ Home program, this free video series focuses each week on one aspect of fitness designed to support people living with Parkinson's.? These weekly videos highlight the [place] fitness guidelines for people with Parkinson's disease.   ModemGamers.[email or web address]   Dance for PD website is offering free, live-stream classes throughout the week, as well as links to [employer] of classes:  [email or web address]  Virtual dance and Pilates for Parkinson's classes: Click on the Community Tab> Parkinson's Movement Initiative Tab.  To register for classes and for more information, visit [email or web address] and click the "community" tab.   [employer] Parkinson's Cycling Classes   [employer]:  Thursdays @ Noon-Live classes at [employer] ([employer] at [email or web address]?or [phone])  [employer]: Virtual Classes Mondays and Thursdays Jeanette Caprice classes Tuesday, Wednesday and Thursday (contact [place] at [email or web address] ?or [phone])  [place]  Varied levels of classes are offered Tuesdays and Thursdays at [employer].   Stretching with Verdis Frederickson weekly class is also offered for people with Parkinson's  To observe a class or for more information, call [phone] or email Hezzie Bump at [email or web address]   ADDITIONAL SUPPORT AND RESOURCES  [employer]:Online Caregiver Education Opportunities:  www.well-[email or web address]/caregiver-education/[email or web address]-support-group.  You may also contact Vickki Muff at [email or web address] or [phone].     Well-Spring Navigator:  Just1Navigator program, a?free service to help individuals and families through the journey of determining care for older adults.  The "Navigator" is a [phone], Education officer, museum, who will speak with a prospective client and/or loved ones to provide an assessment of the situation and a set of recommendations for a personalized care plan -- all free of charge, and whether?Well[employer]Spring [employer] offers the needed service or not. If the need is not a service we provide, we are well-connected with reputable programs in  town that we  can refer you to.  www.well-springsolutions.org or to speak with the Navigator, call [phone].

## 2022-08-25 ENCOUNTER — Telehealth: Payer: Self-pay | Admitting: Neurology

## 2022-08-25 NOTE — Telephone Encounter (Signed)
noted 

## 2022-08-25 NOTE — Telephone Encounter (Signed)
Tat took patient off her mirtazapine. She is doing good without it. She was to call and let Dr.Tat know

## 2022-09-02 DIAGNOSIS — M25641 Stiffness of right hand, not elsewhere classified: Secondary | ICD-10-CM | POA: Diagnosis not present

## 2022-09-02 DIAGNOSIS — M19041 Primary osteoarthritis, right hand: Secondary | ICD-10-CM | POA: Diagnosis not present

## 2022-09-02 DIAGNOSIS — M25541 Pain in joints of right hand: Secondary | ICD-10-CM | POA: Diagnosis not present

## 2022-09-09 ENCOUNTER — Telehealth: Payer: Self-pay | Admitting: Anesthesiology

## 2022-09-09 NOTE — Telephone Encounter (Signed)
Called patient and left voicemail to hold medication at this time and can discuss with Dr. Carles Collet in the future if needed

## 2022-09-09 NOTE — Telephone Encounter (Signed)
Pt called to inform Dr Tat that she stopped her medication mirtazapine on 08/18/2022, she has been doing well without it. States she would like to ask Dr Tat if she can stop the medication completely or will she need to get back on it.

## 2022-09-17 DIAGNOSIS — U071 COVID-19: Secondary | ICD-10-CM | POA: Diagnosis not present

## 2022-09-29 DIAGNOSIS — M19041 Primary osteoarthritis, right hand: Secondary | ICD-10-CM | POA: Diagnosis not present

## 2022-09-29 DIAGNOSIS — M25641 Stiffness of right hand, not elsewhere classified: Secondary | ICD-10-CM | POA: Diagnosis not present

## 2022-09-29 DIAGNOSIS — M25541 Pain in joints of right hand: Secondary | ICD-10-CM | POA: Diagnosis not present

## 2022-10-19 DIAGNOSIS — H5213 Myopia, bilateral: Secondary | ICD-10-CM | POA: Diagnosis not present

## 2022-10-19 DIAGNOSIS — H52223 Regular astigmatism, bilateral: Secondary | ICD-10-CM | POA: Diagnosis not present

## 2022-10-19 DIAGNOSIS — H43811 Vitreous degeneration, right eye: Secondary | ICD-10-CM | POA: Diagnosis not present

## 2022-10-19 DIAGNOSIS — H35372 Puckering of macula, left eye: Secondary | ICD-10-CM | POA: Diagnosis not present

## 2022-10-19 DIAGNOSIS — H524 Presbyopia: Secondary | ICD-10-CM | POA: Diagnosis not present

## 2022-10-27 DIAGNOSIS — M19041 Primary osteoarthritis, right hand: Secondary | ICD-10-CM | POA: Diagnosis not present

## 2022-11-09 ENCOUNTER — Other Ambulatory Visit: Payer: Self-pay | Admitting: Neurology

## 2022-11-09 DIAGNOSIS — G20A1 Parkinson's disease without dyskinesia, without mention of fluctuations: Secondary | ICD-10-CM

## 2022-11-09 DIAGNOSIS — G20B1 Parkinson's disease with dyskinesia, without mention of fluctuations: Secondary | ICD-10-CM

## 2022-11-19 ENCOUNTER — Other Ambulatory Visit: Payer: Self-pay | Admitting: Neurology

## 2022-11-19 DIAGNOSIS — G20A1 Parkinson's disease without dyskinesia, without mention of fluctuations: Secondary | ICD-10-CM

## 2022-11-19 DIAGNOSIS — G20B1 Parkinson's disease with dyskinesia, without mention of fluctuations: Secondary | ICD-10-CM

## 2022-12-08 ENCOUNTER — Telehealth: Payer: Self-pay | Admitting: Neurology

## 2022-12-08 ENCOUNTER — Other Ambulatory Visit: Payer: Self-pay

## 2022-12-08 DIAGNOSIS — G20B1 Parkinson's disease with dyskinesia, without mention of fluctuations: Secondary | ICD-10-CM

## 2022-12-08 MED ORDER — RYTARY 48.75-195 MG PO CPCR: ORAL_CAPSULE | ORAL | 0 refills | Status: DC

## 2022-12-08 NOTE — Telephone Encounter (Signed)
Patient would like to speak to someone about the rytary and also her having trouble walking   please call

## 2022-12-08 NOTE — Telephone Encounter (Signed)
[facility]  Patient did let me know she is having trouble over the past two weeks were she is getting frozen and not able to take that step forward

## 2022-12-09 NOTE — Progress Notes (Unsigned)
Assessment/Plan:    Parkinsons Disease, sx's since at least 2015 -Increase Rytary to 45 mg, 1 po 8am/noon/4pm.   -***Check CBC, chemistry, UA given increased freezing -maternal grandfather both had Parkinson's disease.  Discussed the value of this type of testing.  She can think about that.  Discussed PDGeneration study    2.  Parkinson's dyskinesia             -Patient to continue amantadine, 100 mg 3 times per day.  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   3.  RBD             -Overall mild and no additional medication required.   4.  Cervical dystonia             -Is the likely source of the neck pain, but that is actually better, which is good because she was very nervous about the Botox.   5  dysphagia  -Modified barium evaluation done on August 13, 2021.  There was evidence of mild oropharyngeal dysphagia.  Regular diet with thin liquids recommended.  6.  Memory change  -discussed neurocog testing but they decided to hold on that for now  7.  Weight loss/insomnia  -she is off of the mirtazapine.  She thought it made the dreaming worse. Subjective:   Chelsea Conley was seen today in follow up for Parkinsons disease.  My previous records were reviewed prior to todays visit as well as outside records available to me.  Pt with husband who supplements hx. last visit.  Last visit, we had changed her to Rytary and she was tolerating that well.  She was a little bit underdosed, but she was happy with the Rytary and did not want to change anything.  She emailed me earlier this week and we worked her in the day.  She said she had been having issues over the last few weeks with feeling frozen and trouble initiating gait.  Current prescribed movement disorder medications: Rytary 195, 1 tablet at 8 AM/noon/4 PM Amantadine, 100 mg 3 times per day  Prior medications: Carbidopa/levodopa IR (nausea/vomiting); selegiline  (discontinued because of cost); carbidopa/levodopa CR (still with some nausea); mirtazapine (she thought it caused more dreams  ALLERGIES:  No Known Allergies  CURRENT MEDICATIONS:  Current Meds  Medication Sig   amantadine (SYMMETREL) 100 MG capsule TAKE 1 CAPSULE BY MOUTH IN THE MORNING, AT NOON, AND AT BEDTIME.   Carbidopa-Levodopa ER (RYTARY) 48.75-195 MG CPCR 1 capsule three times daily at 8am/noon/4pm   mirtazapine (REMERON) 15 MG tablet Take 1 tablet (15 mg total) by mouth at bedtime.   [DISCONTINUED] Carbidopa-Levodopa ER (RYTARY) 48.75-195 MG CPCR Samples of this drug were given to the patient, quantity 3, Lot Number [id number] Av Exp 08/23;e   [DISCONTINUED] Carbidopa-Levodopa ER (RYTARY) 48.75-195 MG CPCR Samples of this drug were given to the patient, quantity 3, Lot Number [id number] 10/24     Objective:   PHYSICAL EXAMINATION:    VITALS:   Vitals:   08/18/22 1512  BP: 118/78  Pulse: 81  SpO2: 97%  Weight: 134 lb 12.8 oz (61.1 kg)  Height: 5\' 3"  (1.6 m)   Wt Readings from Last 3 Encounters:  08/18/22 134 lb 12.8 oz (61.1 kg)  02/12/22 124 lb 6.4 oz (56.4 kg)  07/31/21 136 lb (61.7 kg)      GEN:  The patient appears stated age and is in NAD. HEENT:  Normocephalic,  atraumatic.  The mucous membranes are moist. The superficial temporal arteries are without ropiness or tenderness. CV:  RRR Lungs:  CTAB Neck/HEME:  There are no carotid bruits bilaterally.  Neurological examination:  Orientation: The patient is alert and oriented x3. Cranial nerves: There is good facial symmetry with mild facial hypomimia. The speech is fluent and clear. Soft palate rises symmetrically and there is no tongue deviation. Hearing is intact to conversational tone. Sensation: Sensation is intact to light touch throughout Motor: Strength is at least antigravity x4.   Movement examination: Tone: There is mild to moderate increased tone in left upper extremity.  Mild increased tone in the  right upper extremity.  This is similar to last visit Abnormal movements: There is initially min LUE rest tremor;  there is rare L foot tremor Coordination:  There is no decremation but she has trouble with finger taps on the right due to recent thumb sx Gait and Station: The patient has no difficulty arising out of a deep-seated chair without the use of the hands.  She ambulates well.   She is mildly flexed.    I have reviewed and interpreted the following labs independently    Chemistry      Component Value Date/Time   NA 143 11/21/2019 1046   K 4.8 11/21/2019 1046   CL 105 11/21/2019 1046   CO2 25 11/21/2019 1046   BUN 15 11/21/2019 1046   CREATININE 0.80 11/21/2019 1046      Component Value Date/Time   CALCIUM 9.8 11/21/2019 1046   ALKPHOS 120 (H) 11/21/2019 1046   AST 14 11/21/2019 1046   ALT 5 11/21/2019 1046   BILITOT 0.4 11/21/2019 1046       Lab Results  Component Value Date   WBC 5.0 05/10/2008   HGB 14.1 05/10/2008   HCT 42.3 05/10/2008   MCV 91.0 05/10/2008   PLT 255 05/10/2008    No results found for: "TSH"   Total time spent on today's visit was *** minutes, including both face-to-face time and nonface-to-face time.  Time included that spent on review of records (prior notes available to me/labs/imaging if pertinent), discussing treatment and goals, answering patient's questions and coordinating care.  Cc:  Joycelyn Rua, MD

## 2022-12-10 ENCOUNTER — Ambulatory Visit: Payer: PPO | Admitting: Neurology

## 2022-12-10 ENCOUNTER — Encounter: Payer: Self-pay | Admitting: Neurology

## 2022-12-10 ENCOUNTER — Other Ambulatory Visit (INDEPENDENT_AMBULATORY_CARE_PROVIDER_SITE_OTHER): Payer: PPO

## 2022-12-10 VITALS — BP 118/91 | HR 82 | Ht 63.0 in | Wt 134.0 lb

## 2022-12-10 DIAGNOSIS — R6889 Other general symptoms and signs: Secondary | ICD-10-CM | POA: Diagnosis not present

## 2022-12-10 DIAGNOSIS — Z5181 Encounter for therapeutic drug level monitoring: Secondary | ICD-10-CM | POA: Diagnosis not present

## 2022-12-10 DIAGNOSIS — R413 Other amnesia: Secondary | ICD-10-CM

## 2022-12-10 DIAGNOSIS — G20A2 Parkinson's disease without dyskinesia, with fluctuations: Secondary | ICD-10-CM | POA: Diagnosis not present

## 2022-12-10 LAB — CBC
HCT: 40.1 % (ref 36.0–46.0)
Hemoglobin: 13.2 g/dL (ref 12.0–15.0)
MCHC: 33 g/dL (ref 30.0–36.0)
MCV: 88 fl (ref 78.0–100.0)
Platelets: 257 10*3/uL (ref 150.0–400.0)
RBC: 4.56 Mil/uL (ref 3.87–5.11)
RDW: 14.2 % (ref 11.5–15.5)
WBC: 5.7 10*3/uL (ref 4.0–10.5)

## 2022-12-10 LAB — COMPREHENSIVE METABOLIC PANEL
ALT: 3 U/L (ref 0–35)
AST: 17 U/L (ref 0–37)
Albumin: 4.3 g/dL (ref 3.5–5.2)
Alkaline Phosphatase: 94 U/L (ref 39–117)
BUN: 23 mg/dL (ref 6–23)
CO2: 27 mEq/L (ref 19–32)
Calcium: 9.6 mg/dL (ref 8.4–10.5)
Chloride: 104 mEq/L (ref 96–112)
Creatinine, Ser: 0.86 mg/dL (ref 0.40–1.20)
GFR: 66.81 mL/min (ref >60.00)
Glucose, Bld: 92 mg/dL (ref 70–99)
Potassium: 4.8 mEq/L (ref 3.5–5.1)
Sodium: 139 mEq/L (ref 135–145)
Total Bilirubin: 0.4 mg/dL (ref 0.2–1.2)
Total Protein: 7.4 g/dL (ref 6.0–8.3)

## 2022-12-10 MED ORDER — RYTARY 61.25-245 MG PO CPCR: ORAL_CAPSULE | ORAL | 0 refills | Status: DC

## 2022-12-10 NOTE — Patient Instructions (Addendum)
Your provider has requested that you have labwork completed today. The lab is located on the Second floor at [address], within the [facility] office. When you get off the elevator, turn right and go in the [facility] [address]; the first brown door on the left.  Tell the ladies behind the desk that you are there for lab work. If you are not called within 15 minutes please check with the front desk.   Once you complete your labs you are free to go. You will receive a call or message via MyChart with your lab results.    Local and Online Resources for Power over Parkinson's Group  June 2024   LOCAL [place] PARKINSON'S GROUPS   Power over Parkinson's Group:    Power Over Parkinson's Patient Education Group will be Wednesday, JULY 10th-*PLEASE NOTE:  We will not be having a June meeting.  Our next meeting will be in July.  We apologize for the inconvenience.   Power over [employer] and Care Partner Groups will meet together, with plans for separate break out session for caregivers, depending on topic/speaker Upcoming Power over Parkinson's Meetings/Care Partner Support:  2nd Wednesdays of the month at 2 pm:   No regular June meeting, July 10th  Contact Amy Marriott at [email or web address] or Lynwood Dawley at [email or web address] if interested in participating in this group    LOCAL EVENTS AND NEW OFFERINGS  PLEASE NOTE:  There will be no June Power over [employer] meeting [facility] June 12th) Picnic Party for [employer].  [facility] Movement Disorders Program Celebration.  June 19th 1-3 pm.  Contact Lynwood Dawley at [email or web address] Parkinson's Social Game Night.  First Thursday of each month, 2:00-4:00 pm.  *Next date is June 6th*.  Rossie Muskrat [employer], [employer].  Contact [email or web address] if interested. Parkinson's CarePartner Group for Men is in the works, if interested email [email or web address] ACT FITNESS Chair Yoga classes "Train and Gain", Fridays 10 am, [employer].  Contact Gina at [phone].  Health visitor Classes offering at [employer]!  TUESDAYS (Chair Yoga)  and Wednesdays (PWR! Moves)  1:00 pm.   Contact Synetta Shadow at  [email or web address]  or [phone]  Antoine Poche! Moves classes.  Thursdays at 11:45, [facility].  Free to participate through [employer].  Contact Lynwood Dawley at [email or web address] or [phone] to register Drumming for Parkinson's will be held on 2nd and 4th Mondays at 11:00 am.   Located at the [place] of the [employer] ([address] [place].)  Contact Albertina Parr at [email or web address] or [phone]  [facility] Parkinson's Tai Chi Class, Mondays at 11 am.  Call [phone] for details   [facility] EDUCATION AND SUPPORT  Parkinson Foundation:  www.parkinson.org  PD Health at Home continues:  Mindfulness Mondays, Wellness Wednesdays, Fitness Fridays  (PWR! Moves as part of Fitness Fridays March 22nd, 1-1:45 pm) Upcoming Education:   Current and Emerging Methods to Aid Parkinson's Diagnosis.  Wednesday, May 29th, 1-2 pm Recognize and Respond to Parkinson's Psychosis.  Wednesday, June 5th, 1-2 pm Parkinsonisms.  Wednesday, June 12th, 1-2 pm Expert Briefing:  Addressing the Challenge of Apathy in Parkinson's.  Wednesday, September 11th, 1-2 pm. Register for virtual education and Photographer (webinars) at [email or web address].gl Please check out their website to sign up for emails and see their full online offerings     [employer]:  www.[email or web address]   Third Thursday Webinars:  On the third Thursday of  every month at 12 p.m. ET, join our free live webinars to learn about various aspects of living with Parkinson's disease and our work to speed  medical breakthroughs.  Upcoming Webinar:  You Want to [employer] for Parkinson's Research: Now What?  Thursday, June 20th at 12 noon. Check out additional information on their website to see their full online offerings    [facility]:  [email or web address]  Upcoming Webinar:  Living Safely with Parkinson's Inside and Outside of your Home.  Thursday, June 13th , 3 pm Series:  Living with Parkinson's Meetup.   Third Thursdays each month, 3 pm  Care Partner Monthly Meetup.  With Jillene Bucks [place].  First Tuesday of each month, 2 pm  Check out additional information to Live Well Today on their website    Parkinson and Movement Disorders (PMD) Alliance:  www.pmdalliance.org  [employer] Online:  Online Education Events  Sign up for emails, which are sent weekly to give you updates on programming and online offerings    Parkinson's Association of the [place]:  www.parkinsonassociation.org  Information on online support groups, education events, and online exercises including Yoga, Parkinson's exercises and more-LOTS of information on links to PD resources and online events  Virtual Support Group through Parkinson's Association of the [place]; next one is scheduled for Wednesday, June 5th   MOVEMENT AND EXERCISE OPPORTUNITIES  Parkinson's Exercise Class offerings at [employer]. *TUESDAYS* (Chair yoga) and Wednesdays (PWR! Moves)  1:00 pm.   *Please note that PWR! Moves [place] class has ended.  Please consider trying PWR! Moves on Wednesdays at 1 pm at [facility].  Contact Synetta Shadow at [email or web address]    Parkinson's Wellness Recovery (PWR! Moves)  www.[email or web address]  Info on the PWR! Virtual Experience:  You will have access to our expertise?through self-assessment, guided plans that start with the PD-specific fundamentals, educational content, tips, Q&A with an expert, and a growing Engineering geologist of PD-specific pre-recorded and live exercise classes  of varying types and intensity - both physical and cognitive! If that is not enough, we offer 1:1 wellness consultations (in-person or virtual) to personalize your PWR! Dance movement psychotherapist.   Parkinson [employer] Fridays:   As part of the PD Health @ Home program, this free video series focuses each week on one aspect of fitness designed to support people living with Parkinson's.? These weekly videos highlight the Parkinson Foundation fitness guidelines for people with Parkinson's disease.  [email or web address].com.[email or web address]  Dance for PD website is offering free, live-stream classes throughout the week, as well as links to [employer] of classes:  [email or web address]  Virtual dance and Pilates for Parkinson's classes: Click on the Community Tab> Parkinson's Movement Initiative Tab.  To register for classes and for more information, visit www.[email or web address].co.[email or web address] and click the "community" tab.   [employer] Parkinson's Cycling Classes   [facility] [employer]:  Thursdays @ Noon-Live classes at [employer] ([employer] at [email or web address]?or [phone])  [facility] [employer]: Classes Tuesday, Wednesday and Thursday (contact [place] at [email or web address] ?or [phone])  [facility] [employer]  Varied levels of classes are offered Tuesdays and Thursdays at [employer].   Stretching with Byrd Hesselbach weekly class is also offered for people with Parkinson's  To observe a class or for more information, call [phone] or email Patricia Nettle at [email or web address]   ADDITIONAL SUPPORT AND RESOURCES  Well[employer]Spring [employer]:  Online Caregiver Education Opportunities:  www.well-springsolutions.org/caregiver-education/caregiver-support-group.  You may also contact Loleta Chance at [facility] -spring.org or [phone].     Well-Spring Navigator:  Just1Navigator program, a?free service to help  individuals and  families through the journey of determining care for older adults.  The "Navigator" is a Child psychotherapist, Sidney Ace, who will speak with a prospective client and/or loved ones to provide an assessment of the situation and a set of recommendations for a personalized care plan -- all free of charge, and whether?Well-Spring Solutions offers the needed service or not. If the need is not a service we provide, we are well-connected with reputable programs in town that we can refer you to.  www.well-springsolutions.org or to speak with the Navigator, call [phone].

## 2022-12-11 LAB — UA/M W/RFLX CULTURE, COMP
Bilirubin, UA: NEGATIVE
Glucose, UA: NEGATIVE
Ketones, UA: NEGATIVE
Leukocytes,UA: NEGATIVE
Nitrite, UA: NEGATIVE
Protein,UA: NEGATIVE
RBC, UA: NEGATIVE
Specific Gravity, UA: 1.025 (ref 1.005–1.030)
Urobilinogen, Ur: 0.2 mg/dL (ref 0.2–1.0)
pH, UA: 5.5 (ref 5.0–7.5)

## 2022-12-11 LAB — MICROSCOPIC EXAMINATION
Bacteria, UA: NONE SEEN
Casts: NONE SEEN /lpf
WBC, UA: NONE SEEN /hpf (ref 0–5)

## 2022-12-17 DIAGNOSIS — H43811 Vitreous degeneration, right eye: Secondary | ICD-10-CM | POA: Diagnosis not present

## 2022-12-21 ENCOUNTER — Telehealth: Payer: Self-pay | Admitting: Pharmacy Technician

## 2022-12-24 ENCOUNTER — Other Ambulatory Visit (HOSPITAL_COMMUNITY): Payer: Self-pay

## 2022-12-24 ENCOUNTER — Telehealth: Payer: Self-pay

## 2022-12-24 NOTE — Telephone Encounter (Signed)
*  LBN  PA request received for Rytary [id number]  er capsules  PA submitted to [employer] via CMM and is pending additional questions/determination  Key: [id number]

## 2022-12-24 NOTE — Telephone Encounter (Signed)
PA submitted, pending determination and will be updated in additional encounter created.

## 2023-01-01 ENCOUNTER — Other Ambulatory Visit: Payer: Self-pay

## 2023-01-01 ENCOUNTER — Ambulatory Visit: Payer: PPO | Attending: Neurology | Admitting: Physical Therapy

## 2023-01-01 DIAGNOSIS — R293 Abnormal posture: Secondary | ICD-10-CM | POA: Diagnosis not present

## 2023-01-01 DIAGNOSIS — R2689 Other abnormalities of gait and mobility: Secondary | ICD-10-CM | POA: Diagnosis not present

## 2023-01-01 DIAGNOSIS — G20A2 Parkinson's disease without dyskinesia, with fluctuations: Secondary | ICD-10-CM | POA: Diagnosis not present

## 2023-01-01 DIAGNOSIS — M6281 Muscle weakness (generalized): Secondary | ICD-10-CM | POA: Diagnosis not present

## 2023-01-01 DIAGNOSIS — R2681 Unsteadiness on feet: Secondary | ICD-10-CM | POA: Diagnosis not present

## 2023-01-01 DIAGNOSIS — R29818 Other symptoms and signs involving the nervous system: Secondary | ICD-10-CM | POA: Insufficient documentation

## 2023-01-01 NOTE — Therapy (Signed)
OUTPATIENT PHYSICAL THERAPY NEURO EVALUATION   Patient Name: Chelsea Conley MRN: [id number] DOB:Apr 29, 1951, 72 y.o., female Today's Date: 01/01/2023   PCP: Joycelyn Rua, MD REFERRING PROVIDER:    Tat, Octaviano Batty, DO    END OF SESSION:  PT End of Session - 01/01/23 1235     Visit Number 1    Number of Visits 13    Date for PT Re-Evaluation 02/12/23    Authorization Type HTA    Progress Note Due on Visit 10    PT Start Time 1115   PT getting late start from previous patient   PT Stop Time 1148    PT Time Calculation (min) 33 min    Equipment Utilized During Treatment Gait belt    Activity Tolerance Patient tolerated treatment well    Behavior During Therapy WFL for tasks assessed/performed             Past Medical History:  Diagnosis Date   History of seizures    History of tremor    Parkinson disease 05/15/2015   Past Surgical History:  Procedure Laterality Date   FOOT SURGERY Bilateral    MOUTH SURGERY     X2   TONSILLECTOMY     Patient Active Problem List   Diagnosis Date Noted   Arthritis of carpometacarpal [facility]) joint of right thumb 12/21/2018   HLD (hyperlipidemia) 05/15/2015   Seizure (HCC) 05/15/2015   Has a tremor 05/15/2015   Parkinson disease 05/15/2015    ONSET DATE: 12/10/2022 (MD referral)  REFERRING DIAG: G20.A2 (ICD-10-CM) - Parkinson's disease without dyskinesia, with fluctuating manifestations   THERAPY DIAG:  Other abnormalities of gait and mobility  Unsteadiness on feet  Muscle weakness (generalized)  Abnormal posture  Other symptoms and signs involving the nervous system  Rationale for Evaluation and Treatment: Rehabilitation  SUBJECTIVE:                                                                                                                                                                                             SUBJECTIVE STATEMENT: The thing that is bothering me the most is that when I start walking, I  freeze.  When I'm in a small space or turn around, I tend to freeze.  This has been going on for over a month.  Have almost fallen, due to trying to move forward and feet are stuck.  L side more affected with PD. Pt accompanied by: self  PERTINENT HISTORY: Parkinsons Disease, sx's since at least 2015   PAIN:  Are you having pain? No  PRECAUTIONS: Fall  WEIGHT BEARING RESTRICTIONS: No  FALLS: Has patient  fallen in last 6 months? 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), goes to church (has some freezing episodes), does shopping  PATIENT GOALS: To keep walking and to lessen the freezing episodes  OBJECTIVE:   DIAGNOSTIC FINDINGS: NA for this episode  COGNITION: Overall cognitive status: Within functional limits for tasks assessed *Slowed mobility with dual cognitive task  SENSATION: Light touch: WFL  COORDINATION: Slightly slowed alternating tapping  MUSCLE TONE: LLE: Mild  POSTURE: rounded shoulders, forward head, and some trunk dyskinesia  LOWER EXTREMITY ROM:   A/ROM WFL    LOWER EXTREMITY MMT:    MMT Right Eval Left Eval  Hip flexion 4 4  Hip extension    Hip abduction    Hip adduction    Hip internal rotation    Hip external rotation    Knee flexion 4+ 4+  Knee extension 4+ 4+  Ankle dorsiflexion 4 4  Ankle plantarflexion    Ankle inversion    Ankle eversion    (Blank rows = not tested)  TRANSFERS: Assistive device utilized: None  Sit to stand: Modified independence Stand to sit: Modified independence  GAIT: Gait pattern: step through pattern, decreased arm swing- Right, decreased arm swing- Left, trunk flexed, and narrow BOS Distance walked: 50 ft Assistive device utilized: None Level of assistance: Modified  independence Comments: no freezing noted in eval, but narrow BOS noted upon standing and initiating gait  FUNCTIONAL TESTS:  5 times sit to stand: 17.19 sec Timed up and go (TUG): 16.53 sec 10 meter walk test: 11.07 sec= 2.96 ft/sec MiniBESTest: 18/28 TUG cognitive:  20.22 sec TUG manual:  16.97 sec  PATIENT SURVEYS:  Freezing of gait questionnaire 10 Item 1  1 Item 2  1 Item 3  3 Item 4  2 Item 5  2 Item 6  1 TODAY'S TREATMENT:                                                                                                                              DATE: 01/01/2023    PATIENT EDUCATION: Education details: Eval results, POC, initial tips to decrease freezing episodes (wide BOS, weightshifting, stop and reset) Person educated: Patient Education method: Explanation and Demonstration Education comprehension: verbalized understanding and returned demonstration  HOME EXERCISE PROGRAM: Not yet initiated  GOALS: Goals reviewed with patient? Yes  SHORT TERM GOALS: Target date: 01/29/2023  Pt will be independent with HEP for improved balance, gait.  Baseline: Goal status: INITIAL  2.  Pt will improve 5x sit<>stand to less than or equal to 12.5 sec to demonstrate improved functional strength and transfer efficiency.  Baseline: 17.19 sec Goal status: INITIAL  3.  Pt will improve TUG/TUG cognitive score to less than or equal to 10% difference sec  for decreased fall risk.  Baseline: 16.53, 20.22 sec Goal status: INITIAL  4.  FOG score to decrease to 7 or less to demonstrate  decrease freezing episodes Baseline: 10 Goal status: INITIAL   LONG TERM GOALS: Target date: 02/12/2023  Pt will be independent with HEP for improved balance and gait.  Baseline:  Goal status: INITIAL  2.  Pt will improve MiniBESTest score to at least 22/28 to decrease fall risk. Baseline: 18/28 Goal status: INITIAL  3.  Pt will verbalize understanding of local Parkinson's disease community  resources.  Baseline:  Goal status: INITIAL  ASSESSMENT:  CLINICAL IMPRESSION: Patient is a 72 y.o. female who was seen today for physical therapy evaluation and treatment for Parkinson's disease.   She recently saw Dr. Arbutus Leas, and pt reports >1 month hx of increased freezing episodes with gait.  She reports increased freezing episodes with initiation of gait and with turns.  She demo decreased functional strength, decreased balance (decreased SLS, decreased reactive postural strategies, decreased dual tasking), decreased timing and coordination of gait.  She is at increased fall risk per FTSTS, TUG/TUG cognitive/TUG manual scores, MiniBEstest.  She exercises with [employer] and notes some balance difficulty with quick changes in lateral directions.  She will benefit from skilled PT towards goals for improved functional mobility and decreased fall risk.  OBJECTIVE IMPAIRMENTS: Abnormal gait, decreased balance, decreased mobility, difficulty walking, decreased strength, and postural dysfunction.   ACTIVITY LIMITATIONS: standing, transfers, and locomotion level  PARTICIPATION LIMITATIONS: shopping, community activity, and community fitness  PERSONAL FACTORS: 1-2 comorbidities: hx of seizure, foot surgeries  are also affecting patient's functional outcome.   REHAB POTENTIAL: Good  CLINICAL DECISION MAKING: Evolving/moderate complexity  EVALUATION COMPLEXITY: Moderate  PLAN:  PT FREQUENCY: 2x/week  PT DURATION: 6 weeks plus eval  PLANNED INTERVENTIONS: Therapeutic exercises, Therapeutic activity, Neuromuscular re-education, Balance training, Gait training, Patient/Family education, and Self Care  PLAN FOR NEXT SESSION: Review tips to decrease freezing episodes; PWR! Moves in standing; gait and turns with wider BOS   Damica Gravlin W., PT 01/01/2023, 12:36 PM  [facility] at [facility] [address] [place], [address] [place], [place] [address] Phone # [phone] Fax # [phone]

## 2023-01-05 NOTE — Therapy (Signed)
OUTPATIENT PHYSICAL THERAPY NEURO TREATMENT   Patient Name: Chelsea Conley MRN: [id number] DOB:May 21, 1951, 72 y.o., female Today's Date: 01/06/2023   PCP: Joycelyn Rua, MD REFERRING PROVIDER:    Vladimir Faster, DO    END OF SESSION:  PT End of Session - 01/06/23 1142     Visit Number 2    Number of Visits 13    Date for PT Re-Evaluation 02/12/23    Authorization Type HTA    Progress Note Due on Visit 10    PT Start Time 1102    PT Stop Time 1141    PT Time Calculation (min) 39 min    Equipment Utilized During Treatment Gait belt    Activity Tolerance Patient tolerated treatment well    Behavior During Therapy WFL for tasks assessed/performed              Past Medical History:  Diagnosis Date   History of seizures    History of tremor    Parkinson disease 05/15/2015   Past Surgical History:  Procedure Laterality Date   FOOT SURGERY Bilateral    MOUTH SURGERY     X2   TONSILLECTOMY     Patient Active Problem List   Diagnosis Date Noted   Arthritis of carpometacarpal [facility]) joint of right thumb 12/21/2018   HLD (hyperlipidemia) 05/15/2015   Seizure (HCC) 05/15/2015   Has a tremor 05/15/2015   Parkinson disease 05/15/2015    ONSET DATE: 12/10/2022 (MD referral)  REFERRING DIAG: G20.A2 (ICD-10-CM) - Parkinson's disease without dyskinesia, with fluctuating manifestations   THERAPY DIAG:  Other abnormalities of gait and mobility  Unsteadiness on feet  Muscle weakness (generalized)  Abnormal posture  Other symptoms and signs involving the nervous system  Rationale for Evaluation and Treatment: Rehabilitation  SUBJECTIVE:                                                                                                                                                                                             SUBJECTIVE STATEMENT: Nothing new.  Pt accompanied by: self  PERTINENT HISTORY: Parkinsons Disease, sx's since at least 2015   PAIN:  Are  you having pain? No  PRECAUTIONS: Fall  WEIGHT BEARING RESTRICTIONS: No  FALLS: Has patient fallen in last 6 months? No and "came close a couple of times"  LIVING ENVIRONMENT: Lives with: lives with their family Lives in: House/apartment Stairs: No Has following equipment at home: Single point cane  PLOF: Independent, does [employer] (has difficulty with sidestep shuffling), goes to church (has some freezing episodes), does shopping  PATIENT GOALS: To keep walking and to lessen  the freezing episodes  OBJECTIVE:      TODAY'S TREATMENT: 01/06/23 Activity Comments  Nustep L5 x 3 min, L3 x 3 min UEs/LEs  Cues for 75-80SPM  sitting PWR! Moves performed with cues for outstretch palm, large amplitude movement  PWR! Up 10x  PWR! Rock 10x   PWR! Twist 10x  PWR! Step 10x   Good coordination and amplitude   walking around therapy poles in "S" pattern  Cueing for heel-toe pattern and reciprocal arm swing d/t shuffling steps   gait and turns  Cueing to pick up feet and "march" with turns          HEP: Seated PWR! Moves 2x10 daily    PATIENT EDUCATION: Education details: edu and handout on tips to avoid freezing, edu on PWR! Moves and HEP Person educated: Patient Education method: Explanation, Demonstration, Tactile cues, Verbal cues, and Handouts Education comprehension: verbalized understanding and returned demonstration   Below measures were taken at time of initial evaluation unless otherwise specified:   DIAGNOSTIC FINDINGS: NA for this episode  COGNITION: Overall cognitive status: Within functional limits for tasks assessed *Slowed mobility with dual cognitive task  SENSATION: Light touch: WFL  COORDINATION: Slightly slowed alternating tapping  MUSCLE TONE: LLE: Mild  POSTURE: rounded shoulders, forward head, and some trunk dyskinesia  LOWER EXTREMITY ROM:   A/ROM WFL    LOWER EXTREMITY MMT:    MMT Right Eval Left Eval  Hip flexion 4 4   Hip extension    Hip abduction    Hip adduction    Hip internal rotation    Hip external rotation    Knee flexion 4+ 4+  Knee extension 4+ 4+  Ankle dorsiflexion 4 4  Ankle plantarflexion    Ankle inversion    Ankle eversion    (Blank rows = not tested)  TRANSFERS: Assistive device utilized: None  Sit to stand: Modified independence Stand to sit: Modified independence  GAIT: Gait pattern: step through pattern, decreased arm swing- Right, decreased arm swing- Left, trunk flexed, and narrow BOS Distance walked: 50 ft Assistive device utilized: None Level of assistance: Modified independence Comments: no freezing noted in eval, but narrow BOS noted upon standing and initiating gait  FUNCTIONAL TESTS:  5 times sit to stand: 17.19 sec Timed up and go (TUG): 16.53 sec 10 meter walk test: 11.07 sec= 2.96 ft/sec MiniBESTest: 18/28 TUG cognitive:  20.22 sec TUG manual:  16.97 sec  PATIENT SURVEYS:  Freezing of gait questionnaire 10 Item 1  1 Item 2  1 Item 3  3 Item 4  2 Item 5  2 Item 6  1 TODAY'S TREATMENT:                                                                                                                              DATE: 01/01/2023    PATIENT EDUCATION: Education details: Eval results, POC, initial tips to decrease freezing episodes (wide BOS, weightshifting, stop and  reset) Person educated: Patient Education method: Explanation and Demonstration Education comprehension: verbalized understanding and returned demonstration  HOME EXERCISE PROGRAM: Not yet initiated  GOALS: Goals reviewed with patient? Yes  SHORT TERM GOALS: Target date: 01/29/2023  Pt will be independent with HEP for improved balance, gait.  Baseline: Goal status: IN PROGRESS  2.  Pt will improve 5x sit<>stand to less than or equal to 12.5 sec to demonstrate improved functional strength and transfer efficiency.  Baseline: 17.19 sec Goal status: IN PROGRESS  3.  Pt will improve  TUG/TUG cognitive score to less than or equal to 10% difference sec for decreased fall risk.  Baseline: 16.53, 20.22 sec Goal status: IN PROGRESS  4.  FOG score to decrease to 7 or less to demonstrate  decrease freezing episodes Baseline: 10 Goal status: IN PROGRESS   LONG TERM GOALS: Target date: 02/12/2023  Pt will be independent with HEP for improved balance and gait.  Baseline:  Goal status: IN PROGRESS  2.  Pt will improve MiniBESTest score to at least 22/28 to decrease fall risk. Baseline: 18/28 Goal status: IN PROGRESS  3.  Pt will verbalize understanding of local Parkinson's disease community resources.  Baseline:  Goal status: IN PROGRESS  ASSESSMENT:  CLINICAL IMPRESSION: Patient arrived to session without complaints. Initiated PWR! Moves training in sitting to add into HEP- patient required cues for large palms and using eye boost throughout. Patient performed this well. Worked on gait training with turns and walking around obstacles to address freezing/festination. Patient demonstrates light festination with these activities which was improved with cueing for heel-toe pattern and to "march" in order to complete pivot turns. Patient tolerated session well and without complaints upon leaving.   OBJECTIVE IMPAIRMENTS: Abnormal gait, decreased balance, decreased mobility, difficulty walking, decreased strength, and postural dysfunction.   ACTIVITY LIMITATIONS: standing, transfers, and locomotion level  PARTICIPATION LIMITATIONS: shopping, community activity, and community fitness  PERSONAL FACTORS: 1-2 comorbidities: hx of seizure, foot surgeries  are also affecting patient's functional outcome.   REHAB POTENTIAL: Good  CLINICAL DECISION MAKING: Evolving/moderate complexity  EVALUATION COMPLEXITY: Moderate  PLAN:  PT FREQUENCY: 2x/week  PT DURATION: 6 weeks plus eval  PLANNED INTERVENTIONS: Therapeutic exercises, Therapeutic activity, Neuromuscular re-education,  Balance training, Gait training, Patient/Family education, and Self Care  PLAN FOR NEXT SESSION:progress PWR! Moves to standing; gait and turns with wider BOS    Anette Guarneri, PT, DPT 01/06/23 11:45 AM  [facility] at [facility] [address] [place], [address] [place], [place] [address] Phone # [phone] Fax # [phone]

## 2023-01-06 ENCOUNTER — Encounter: Payer: Self-pay | Admitting: Physical Therapy

## 2023-01-06 ENCOUNTER — Ambulatory Visit: Payer: PPO | Admitting: Physical Therapy

## 2023-01-06 DIAGNOSIS — R2689 Other abnormalities of gait and mobility: Secondary | ICD-10-CM

## 2023-01-06 DIAGNOSIS — M6281 Muscle weakness (generalized): Secondary | ICD-10-CM

## 2023-01-06 DIAGNOSIS — R2681 Unsteadiness on feet: Secondary | ICD-10-CM

## 2023-01-06 DIAGNOSIS — R29818 Other symptoms and signs involving the nervous system: Secondary | ICD-10-CM

## 2023-01-06 DIAGNOSIS — R293 Abnormal posture: Secondary | ICD-10-CM

## 2023-01-06 NOTE — Patient Instructions (Signed)
Tips to reduce freezing episodes with standing or walking:   Stand tall with your feet wide, so that you can rock and weight shift through your hips. Don't try to fight the freeze: if you begin taking slower, faster, smaller steps, STOP, get your posture tall, and RESET your posture and balance.  Take a deep breath before taking the BIG step to start again. March in place, with high knee stepping, to get started walking again. Use auditory cues:  Count out loud, think of a familiar tune or song or cadence, use pocket metronome, to use rhythm to get started walking again. Use visual cues:  Use a line to step over, use laser pointer line to step over, (using BIG steps) to start walking again. Use visual targets to keep your posture tall (look ahead and focus on an object or target at eye level). As you approach where your destination with walking, count your steps out loud and/or focus on your target with your eyes until you are fully there. Use appropriate assistive device, as advised by your physical therapist to assist with taking longer, consistent steps.   

## 2023-01-08 ENCOUNTER — Ambulatory Visit: Payer: PPO | Admitting: Physical Therapy

## 2023-01-08 ENCOUNTER — Encounter: Payer: Self-pay | Admitting: Physical Therapy

## 2023-01-08 DIAGNOSIS — R2689 Other abnormalities of gait and mobility: Secondary | ICD-10-CM

## 2023-01-08 DIAGNOSIS — M6281 Muscle weakness (generalized): Secondary | ICD-10-CM

## 2023-01-08 DIAGNOSIS — R2681 Unsteadiness on feet: Secondary | ICD-10-CM

## 2023-01-08 NOTE — Therapy (Signed)
OUTPATIENT PHYSICAL THERAPY NEURO TREATMENT   Patient Name: Chelsea Conley MRN: [id number] DOB:Dec 04, 1950, 72 y.o., female Today's Date: 01/06/2023   PCP: Joycelyn Rua, MD REFERRING PROVIDER:    Vladimir Faster, DO    END OF SESSION:  PT End of Session - 01/06/23 1142     Visit Number 2    Number of Visits 13    Date for PT Re-Evaluation 02/12/23    Authorization Type HTA    Progress Note Due on Visit 10    PT Start Time 1102    PT Stop Time 1141    PT Time Calculation (min) 39 min    Equipment Utilized During Treatment Gait belt    Activity Tolerance Patient tolerated treatment well    Behavior During Therapy WFL for tasks assessed/performed              Past Medical History:  Diagnosis Date   History of seizures    History of tremor    Parkinson disease 05/15/2015   Past Surgical History:  Procedure Laterality Date   FOOT SURGERY Bilateral    MOUTH SURGERY     X2   TONSILLECTOMY     Patient Active Problem List   Diagnosis Date Noted   Arthritis of carpometacarpal [facility]) joint of right thumb 12/21/2018   HLD (hyperlipidemia) 05/15/2015   Seizure (HCC) 05/15/2015   Has a tremor 05/15/2015   Parkinson disease 05/15/2015    ONSET DATE: 12/10/2022 (MD referral)  REFERRING DIAG: G20.A2 (ICD-10-CM) - Parkinson's disease without dyskinesia, with fluctuating manifestations   THERAPY DIAG:  Other abnormalities of gait and mobility  Unsteadiness on feet  Muscle weakness (generalized)  Abnormal posture  Other symptoms and signs involving the nervous system  Rationale for Evaluation and Treatment: Rehabilitation  SUBJECTIVE:                                                                                                                                                                                             SUBJECTIVE STATEMENT: Going to a picnic after therapy.  Remembering to stop and reset posture has helped to lessen the freezing episodes Pt  accompanied by: self  PERTINENT HISTORY: Parkinsons Disease, sx's since at least 2015   PAIN:  Are you having pain? No  PRECAUTIONS: Fall  WEIGHT BEARING RESTRICTIONS: No  FALLS: Has patient fallen in last 6 months? No and "came close a couple of times"  LIVING ENVIRONMENT: Lives with: lives with their family Lives in: House/apartment Stairs: No Has following equipment at home: Single point cane  PLOF: Independent, does [employer] (has difficulty with sidestep shuffling), goes  to church (has some freezing episodes), does shopping  PATIENT GOALS: To keep walking and to lessen the freezing episodes  OBJECTIVE:     TODAY'S TREATMENT: 01/08/2023 Activity Comments  Nustep L5 x 3 min, L4 x 3 min UEs/LEs  Cues to keep SPM >85-90  sitting PWR! Moves performed with cues for outstretch palm, large amplitude movement  PWR! Up 10x  PWR! Rock 10x   PWR! Twist 10x  PWR! Step 10x  Review of HEP, good form and amplitude  Standing PWR! Moves performed with cues for wider BOSlarge amplitude movement  PWR! Up 10x  PWR! Rock 10x   PWR! Twist 10x  PWR! Step 10x  Good form, does need cues for wider BOS to start each position  Stepping over obstacles: -forward x 10 reps -side x 10 reps 1-2 UE support  Backwards step and weightshift/return to midline BUE support  Worked on turns with short distance gait: -wide U turns -marching turns -weightshifting turns    Progressed to varied short distance gait targets with weightshift/lift and turn  Cues for shift/shift/turn are most beneficial-pt keeps wide BOS and improved ease of turn     HEP: Seated PWR! Moves 2x10 daily    PATIENT EDUCATION: Education details: turning techniques-shift/shift and turn with wide BOS Person educated: Patient Education method: Explanation, Demonstration, Tactile cues, and Verbal cues Education comprehension: verbalized understanding and returned demonstration   Below measures were taken at  time of initial evaluation unless otherwise specified:   DIAGNOSTIC FINDINGS: NA for this episode  COGNITION: Overall cognitive status: Within functional limits for tasks assessed *Slowed mobility with dual cognitive task  SENSATION: Light touch: WFL  COORDINATION: Slightly slowed alternating tapping  MUSCLE TONE: LLE: Mild  POSTURE: rounded shoulders, forward head, and some trunk dyskinesia  LOWER EXTREMITY ROM:   A/ROM WFL    LOWER EXTREMITY MMT:    MMT Right Eval Left Eval  Hip flexion 4 4  Hip extension    Hip abduction    Hip adduction    Hip internal rotation    Hip external rotation    Knee flexion 4+ 4+  Knee extension 4+ 4+  Ankle dorsiflexion 4 4  Ankle plantarflexion    Ankle inversion    Ankle eversion    (Blank rows = not tested)  TRANSFERS: Assistive device utilized: None  Sit to stand: Modified independence Stand to sit: Modified independence  GAIT: Gait pattern: step through pattern, decreased arm swing- Right, decreased arm swing- Left, trunk flexed, and narrow BOS Distance walked: 50 ft Assistive device utilized: None Level of assistance: Modified independence Comments: no freezing noted in eval, but narrow BOS noted upon standing and initiating gait  FUNCTIONAL TESTS:  5 times sit to stand: 17.19 sec Timed up and go (TUG): 16.53 sec 10 meter walk test: 11.07 sec= 2.96 ft/sec MiniBESTest: 18/28 TUG cognitive:  20.22 sec TUG manual:  16.97 sec  PATIENT SURVEYS:  Freezing of gait questionnaire 10 Item 1  1 Item 2  1 Item 3  3 Item 4  2 Item 5  2 Item 6  1 TODAY'S TREATMENT:  DATE: 01/01/2023    PATIENT EDUCATION: Education details: Eval results, POC, initial tips to decrease freezing episodes (wide BOS, weightshifting, stop and reset) Person educated: Patient Education method: Explanation and  Demonstration Education comprehension: verbalized understanding and returned demonstration  HOME EXERCISE PROGRAM: Not yet initiated  GOALS: Goals reviewed with patient? Yes  SHORT TERM GOALS: Target date: 01/29/2023  Pt will be independent with HEP for improved balance, gait.  Baseline: Goal status: IN PROGRESS  2.  Pt will improve 5x sit<>stand to less than or equal to 12.5 sec to demonstrate improved functional strength and transfer efficiency.  Baseline: 17.19 sec Goal status: IN PROGRESS  3.  Pt will improve TUG/TUG cognitive score to less than or equal to 10% difference sec for decreased fall risk.  Baseline: 16.53, 20.22 sec Goal status: IN PROGRESS  4.  FOG score to decrease to 7 or less to demonstrate  decrease freezing episodes Baseline: 10 Goal status: IN PROGRESS   LONG TERM GOALS: Target date: 02/12/2023  Pt will be independent with HEP for improved balance and gait.  Baseline:  Goal status: IN PROGRESS  2.  Pt will improve MiniBESTest score to at least 22/28 to decrease fall risk. Baseline: 18/28 Goal status: IN PROGRESS  3.  Pt will verbalize understanding of local Parkinson's disease community resources.  Baseline:  Goal status: IN PROGRESS  ASSESSMENT:  CLINICAL IMPRESSION: Reviewed seated PWR! Moves with pt demo good form and technique for large amplitude movements.  Progressed to standing PWR! Moves, where pt needs cues for wider BOS for more optimal posture, weightshift and ability for better balance.  Overall, she has good form, but will benefit from more practice.  Worked on various turning techniques, with pt having some slight festination initially.  The most effective turning technique was a modification of wide weightshifting turns, "shift, shift, turn" cues.  She initially does well with this type of turn for wider BOS and ease of turning and will continue to benefit from repetition and progression with cognitive/manual tasks.    OBJECTIVE  IMPAIRMENTS: Abnormal gait, decreased balance, decreased mobility, difficulty walking, decreased strength, and postural dysfunction.   ACTIVITY LIMITATIONS: standing, transfers, and locomotion level  PARTICIPATION LIMITATIONS: shopping, community activity, and community fitness  PERSONAL FACTORS: 1-2 comorbidities: hx of seizure, foot surgeries  are also affecting patient's functional outcome.   REHAB POTENTIAL: Good  CLINICAL DECISION MAKING: Evolving/moderate complexity  EVALUATION COMPLEXITY: Moderate  PLAN:  PT FREQUENCY: 2x/week  PT DURATION: 6 weeks plus eval  PLANNED INTERVENTIONS: Therapeutic exercises, Therapeutic activity, Neuromuscular re-education, Balance training, Gait training, Patient/Family education, and Self Care  PLAN FOR NEXT SESSION:progress PWR! Moves to standing and add to HEP; gait and turns with wider BOS    Lonia Blood, PT 01/08/23 8:30 AM Phone: [phone] Fax: [phone]  [facility] at [facility] [address] [place], [address] [place], [place] [address] Phone # [phone] Fax # [phone]

## 2023-01-11 ENCOUNTER — Ambulatory Visit: Payer: PPO | Attending: Neurology | Admitting: Physical Therapy

## 2023-01-11 ENCOUNTER — Encounter: Payer: Self-pay | Admitting: Physical Therapy

## 2023-01-11 DIAGNOSIS — R2681 Unsteadiness on feet: Secondary | ICD-10-CM | POA: Diagnosis not present

## 2023-01-11 DIAGNOSIS — R29818 Other symptoms and signs involving the nervous system: Secondary | ICD-10-CM | POA: Diagnosis not present

## 2023-01-11 DIAGNOSIS — R2689 Other abnormalities of gait and mobility: Secondary | ICD-10-CM | POA: Diagnosis not present

## 2023-01-11 DIAGNOSIS — M6281 Muscle weakness (generalized): Secondary | ICD-10-CM | POA: Diagnosis not present

## 2023-01-11 DIAGNOSIS — R293 Abnormal posture: Secondary | ICD-10-CM | POA: Diagnosis not present

## 2023-01-11 NOTE — Telephone Encounter (Signed)
PA has been APPROVED from 12/24/2022-07/13/2023

## 2023-01-11 NOTE — Therapy (Signed)
OUTPATIENT PHYSICAL THERAPY NEURO TREATMENT   Patient Name: Chelsea Conley MRN: [id number] DOB:Jul 01, 1951, 72 y.o., female Today's Date: 01/11/2023   PCP: Joycelyn Rua, MD REFERRING PROVIDER:    Vladimir Faster, DO    END OF SESSION:  PT End of Session - 01/11/23 1407     Visit Number 4    Number of Visits 13    Date for PT Re-Evaluation 02/12/23    Authorization Type HTA    Progress Note Due on Visit 10    PT Start Time 1405    PT Stop Time 1443    PT Time Calculation (min) 38 min    Equipment Utilized During Treatment --    Activity Tolerance Patient tolerated treatment well    Behavior During Therapy [facility] for tasks assessed/performed              Past Medical History:  Diagnosis Date   History of seizures    History of tremor    Parkinson disease 05/15/2015   Past Surgical History:  Procedure Laterality Date   FOOT SURGERY Bilateral    MOUTH SURGERY     X2   TONSILLECTOMY     Patient Active Problem List   Diagnosis Date Noted   Arthritis of carpometacarpal [facility]) joint of right thumb 12/21/2018   HLD (hyperlipidemia) 05/15/2015   Seizure (HCC) 05/15/2015   Has a tremor 05/15/2015   Parkinson disease 05/15/2015    ONSET DATE: 12/10/2022 (MD referral)  REFERRING DIAG: G20.A2 (ICD-10-CM) - Parkinson's disease without dyskinesia, with fluctuating manifestations   THERAPY DIAG:  Other abnormalities of gait and mobility  Unsteadiness on feet  Muscle weakness (generalized)  Other symptoms and signs involving the nervous system  Rationale for Evaluation and Treatment: Rehabilitation  SUBJECTIVE:                                                                                                                                                                                             SUBJECTIVE STATEMENT: Did well over the weekend.  Feel like I'm doing better remembering to stop and reset.  Do have some trouble with making the bed-go too fast and feet  get caught.  No falls. Pt accompanied by: self  PERTINENT HISTORY: Parkinsons Disease, sx's since at least 2015   PAIN:  Are you having pain? No  PRECAUTIONS: Fall  WEIGHT BEARING RESTRICTIONS: No  FALLS: Has patient fallen in last 6 months? No and "came close a couple of times"  LIVING ENVIRONMENT: Lives with: lives with their family Lives in: House/apartment Stairs: No Has following equipment at home: Single point cane  PLOF: Independent,  does [employer] (has difficulty with sidestep shuffling), goes to church (has some freezing episodes), does shopping  PATIENT GOALS: To keep walking and to lessen the freezing episodes  OBJECTIVE:       TODAY'S TREATMENT: 01/11/2023 Activity Comments     Standing PWR! Moves performed with cues for wider BOSlarge amplitude movement  PWR! Up 10x  PWR! Rock 10x, 2nd set of 10 with lifting for SLS  PWR! Twist 10x  PWR! Step 10x  Good form, does need cues for wider BOS to start each position, cues for increased foot clearance with step  Stepping over obstacles: -forward x 10 reps -side x 10 reps 1-2 UE support  Backwards step and weightshift/return to midline BUE support  Sidestepping in parallel bars, counting steps to decrease # of steps and increase step length  To L:  6 steps >4 To R:  7 steps >4  Sidestepping along bed 2 reps each directions  For good foot clearance, less hastening of gait  Worked on turns with short distance gait: -weightshifting turns  8 reps x 20 ft  Progressed to varied short distance gait targets with weightshift/lift and turn , then added carry/cognitive task to turns Cues for shift/shift/turn are most beneficial-pt keeps wide BOS and improved ease of turn.  Some slowed, narrow BOS with hesitation at times.     HEP: Seated PWR! Moves 2x10 daily  Standing PWR! Moves 2 x 10 reps daily  PATIENT EDUCATION: Education details: PWR! Moves in standing added to HEP, turning, sidestepping technique  for ease of bed making Person educated: Patient Education method: Explanation, Demonstration, Tactile cues, and Verbal cues Education comprehension: verbalized understanding and returned demonstration   Below measures were taken at time of initial evaluation unless otherwise specified:   DIAGNOSTIC FINDINGS: NA for this episode  COGNITION: Overall cognitive status: Within functional limits for tasks assessed *Slowed mobility with dual cognitive task  SENSATION: Light touch: WFL  COORDINATION: Slightly slowed alternating tapping  MUSCLE TONE: LLE: Mild  POSTURE: rounded shoulders, forward head, and some trunk dyskinesia  LOWER EXTREMITY ROM:   A/ROM WFL    LOWER EXTREMITY MMT:    MMT Right Eval Left Eval  Hip flexion 4 4  Hip extension    Hip abduction    Hip adduction    Hip internal rotation    Hip external rotation    Knee flexion 4+ 4+  Knee extension 4+ 4+  Ankle dorsiflexion 4 4  Ankle plantarflexion    Ankle inversion    Ankle eversion    (Blank rows = not tested)  TRANSFERS: Assistive device utilized: None  Sit to stand: Modified independence Stand to sit: Modified independence  GAIT: Gait pattern: step through pattern, decreased arm swing- Right, decreased arm swing- Left, trunk flexed, and narrow BOS Distance walked: 50 ft Assistive device utilized: None Level of assistance: Modified independence Comments: no freezing noted in eval, but narrow BOS noted upon standing and initiating gait  FUNCTIONAL TESTS:  5 times sit to stand: 17.19 sec Timed up and go (TUG): 16.53 sec 10 meter walk test: 11.07 sec= 2.96 ft/sec MiniBESTest: 18/28 TUG cognitive:  20.22 sec TUG manual:  16.97 sec  PATIENT SURVEYS:  Freezing of gait questionnaire 10 Item 1  1 Item 2  1 Item 3  3 Item 4  2 Item 5  2 Item 6  1 TODAY'S TREATMENT:  DATE: 01/01/2023    PATIENT EDUCATION: Education details: Eval results, POC, initial tips to decrease freezing episodes (wide BOS, weightshifting, stop and reset) Person educated: Patient Education method: Explanation and Demonstration Education comprehension: verbalized understanding and returned demonstration  HOME EXERCISE PROGRAM: Not yet initiated  GOALS: Goals reviewed with patient? Yes  SHORT TERM GOALS: Target date: 01/29/2023  Pt will be independent with HEP for improved balance, gait.  Baseline: Goal status: IN PROGRESS  2.  Pt will improve 5x sit<>stand to less than or equal to 12.5 sec to demonstrate improved functional strength and transfer efficiency.  Baseline: 17.19 sec Goal status: IN PROGRESS  3.  Pt will improve TUG/TUG cognitive score to less than or equal to 10% difference sec for decreased fall risk.  Baseline: 16.53, 20.22 sec Goal status: IN PROGRESS  4.  FOG score to decrease to 7 or less to demonstrate  decrease freezing episodes Baseline: 10 Goal status: IN PROGRESS   LONG TERM GOALS: Target date: 02/12/2023  Pt will be independent with HEP for improved balance and gait.  Baseline:  Goal status: IN PROGRESS  2.  Pt will improve MiniBESTest score to at least 22/28 to decrease fall risk. Baseline: 18/28 Goal status: IN PROGRESS  3.  Pt will verbalize understanding of local Parkinson's disease community resources.  Baseline:  Goal status: IN PROGRESS  ASSESSMENT:  CLINICAL IMPRESSION: Pt continues to report that she is improving with awareness of movement patterns to lessen festinating and freezing techniques.  She does still report turning and sidestepping to make the bed bring on festination.  Worked again on standing PWR! Moves, adding to HEP, and worked on large amplitude movements for improved sidestepping and wide BOS weightshifting turns.  With added cognitive/carry tasks, she reverts to narrowed BOS and some festination, but she is able  to correct and improve.  She will continue to benefit from skilled PT towards goals for improved overall functional mobility and decreased fall risk.     OBJECTIVE IMPAIRMENTS: Abnormal gait, decreased balance, decreased mobility, difficulty walking, decreased strength, and postural dysfunction.   ACTIVITY LIMITATIONS: standing, transfers, and locomotion level  PARTICIPATION LIMITATIONS: shopping, community activity, and community fitness  PERSONAL FACTORS: 1-2 comorbidities: hx of seizure, foot surgeries  are also affecting patient's functional outcome.   REHAB POTENTIAL: Good  CLINICAL DECISION MAKING: Evolving/moderate complexity  EVALUATION COMPLEXITY: Moderate  PLAN:  PT FREQUENCY: 2x/week  PT DURATION: 6 weeks plus eval  PLANNED INTERVENTIONS: Therapeutic exercises, Therapeutic activity, Neuromuscular re-education, Balance training, Gait training, Patient/Family education, and Self Care  PLAN FOR NEXT SESSION:  Review PWR! Moves to standing added to HEP; gait and turns with wider BOS, sidestepping; add cognitive and carry tasks to improve complexity for improved carryover to home.    Lonia Blood, PT 01/11/23 2:49 PM Phone: [phone] Fax: [phone]  [facility] at [facility] [address] [place], [address] [place], [place] [address] Phone # [phone] Fax # [phone]

## 2023-01-13 DIAGNOSIS — Z0000 Encounter for general adult medical examination without abnormal findings: Secondary | ICD-10-CM | POA: Diagnosis not present

## 2023-01-13 DIAGNOSIS — G20A1 Parkinson's disease without dyskinesia, without mention of fluctuations: Secondary | ICD-10-CM | POA: Diagnosis not present

## 2023-01-13 DIAGNOSIS — E782 Mixed hyperlipidemia: Secondary | ICD-10-CM | POA: Diagnosis not present

## 2023-01-15 NOTE — Therapy (Signed)
OUTPATIENT PHYSICAL THERAPY NEURO TREATMENT   Patient Name: Chelsea Conley MRN: [id number] DOB:18-Mar-1951, 72 y.o., female Today's Date: 01/18/2023   PCP: Joycelyn Rua, MD REFERRING PROVIDER:    Tat, Octaviano Batty, DO    END OF SESSION:  PT End of Session - 01/18/23 1100     Visit Number 5    Number of Visits 13    Date for PT Re-Evaluation 02/12/23    Authorization Type HTA    Progress Note Due on Visit 10    PT Start Time 1020    PT Stop Time 1100    PT Time Calculation (min) 40 min    Equipment Utilized During Treatment Gait belt    Activity Tolerance Patient tolerated treatment well    Behavior During Therapy WFL for tasks assessed/performed               Past Medical History:  Diagnosis Date   History of seizures    History of tremor    Parkinson disease 05/15/2015   Past Surgical History:  Procedure Laterality Date   FOOT SURGERY Bilateral    MOUTH SURGERY     X2   TONSILLECTOMY     Patient Active Problem List   Diagnosis Date Noted   Arthritis of carpometacarpal [facility]) joint of right thumb 12/21/2018   HLD (hyperlipidemia) 05/15/2015   Seizure (HCC) 05/15/2015   Has a tremor 05/15/2015   Parkinson disease 05/15/2015    ONSET DATE: 12/10/2022 (MD referral)  REFERRING DIAG: G20.A2 (ICD-10-CM) - Parkinson's disease without dyskinesia, with fluctuating manifestations   THERAPY DIAG:  Other abnormalities of gait and mobility  Unsteadiness on feet  Muscle weakness (generalized)  Other symptoms and signs involving the nervous system  Abnormal posture  Rationale for Evaluation and Treatment: Rehabilitation  SUBJECTIVE:                                                                                                                                                                                             SUBJECTIVE STATEMENT: Doing good. Nothing new.   Pt accompanied by: self  PERTINENT HISTORY: Parkinsons Disease, sx's since at least 2015    PAIN:  Are you having pain? No  PRECAUTIONS: Fall  WEIGHT BEARING RESTRICTIONS: No  FALLS: Has patient fallen in last 6 months? No and "came close a couple of times"  LIVING ENVIRONMENT: Lives with: lives with their family Lives in: House/apartment Stairs: No Has following equipment at home: Single point cane  PLOF: Independent, does [employer] (has difficulty with sidestep shuffling), goes to church (has some freezing episodes), does shopping  PATIENT GOALS: To keep  walking and to lessen the freezing episodes  OBJECTIVE:     TODAY'S TREATMENT: 01/18/23 Activity Comments  standing PWR! Moves performed with demo   PWR! Up 10x   PWR! Rock 10x   PWR! Twist 10x   PWR! Step 10x  Cueing to stop at "ready position" after each PWR! step  standing PWR! flow: PWR! up, rock, twist, step  Performed with demo and verbal sequencing; good stability   backwards walking with wide BOS 3x85ft Cueing to slow down, take larger steps   1/2 turns to targets on floor to promote wider BOS Started with targets, then took them away for carryover, then practiced with walking. Cues to lift feet higher   sidestepping with added balance challenges:  bouncing ball, overhead clap      HEP: -Seated PWR! Moves 2x10 daily  -Standing PWR! Moves 1 x 10 reps daily -Standing PWR! Moves standing flow 5x  Perform a sequence of each of the standing moves in a row: Up > Rock L/R> Twist L/R> Step L/R -practice 1/2 turns near a counter top in 4 steps 2x5 each side, then perform walking with turns in a hallway 2x5 each side   PATIENT EDUCATION: Education details: HEP update, POC Person educated: Patient Education method: Explanation, Demonstration, Tactile cues, Verbal cues, and Handouts Education comprehension: verbalized understanding and returned demonstration    Below measures were taken at time of initial evaluation unless otherwise specified:   DIAGNOSTIC FINDINGS: NA for this  episode  COGNITION: Overall cognitive status: Within functional limits for tasks assessed *Slowed mobility with dual cognitive task  SENSATION: Light touch: WFL  COORDINATION: Slightly slowed alternating tapping  MUSCLE TONE: LLE: Mild  POSTURE: rounded shoulders, forward head, and some trunk dyskinesia  LOWER EXTREMITY ROM:   A/ROM WFL    LOWER EXTREMITY MMT:    MMT Right Eval Left Eval  Hip flexion 4 4  Hip extension    Hip abduction    Hip adduction    Hip internal rotation    Hip external rotation    Knee flexion 4+ 4+  Knee extension 4+ 4+  Ankle dorsiflexion 4 4  Ankle plantarflexion    Ankle inversion    Ankle eversion    (Blank rows = not tested)  TRANSFERS: Assistive device utilized: None  Sit to stand: Modified independence Stand to sit: Modified independence  GAIT: Gait pattern: step through pattern, decreased arm swing- Right, decreased arm swing- Left, trunk flexed, and narrow BOS Distance walked: 50 ft Assistive device utilized: None Level of assistance: Modified independence Comments: no freezing noted in eval, but narrow BOS noted upon standing and initiating gait  FUNCTIONAL TESTS:  5 times sit to stand: 17.19 sec Timed up and go (TUG): 16.53 sec 10 meter walk test: 11.07 sec= 2.96 ft/sec MiniBESTest: 18/28 TUG cognitive:  20.22 sec TUG manual:  16.97 sec  PATIENT SURVEYS:  Freezing of gait questionnaire 10 Item 1  1 Item 2  1 Item 3  3 Item 4  2 Item 5  2 Item 6  1 TODAY'S TREATMENT:  DATE: 01/01/2023    PATIENT EDUCATION: Education details: Eval results, POC, initial tips to decrease freezing episodes (wide BOS, weightshifting, stop and reset) Person educated: Patient Education method: Explanation and Demonstration Education comprehension: verbalized understanding and returned demonstration  HOME  EXERCISE PROGRAM: Not yet initiated  GOALS: Goals reviewed with patient? Yes  SHORT TERM GOALS: Target date: 01/29/2023  Pt will be independent with HEP for improved balance, gait.  Baseline: Goal status: IN PROGRESS  2.  Pt will improve 5x sit<>stand to less than or equal to 12.5 sec to demonstrate improved functional strength and transfer efficiency.  Baseline: 17.19 sec Goal status: IN PROGRESS  3.  Pt will improve TUG/TUG cognitive score to less than or equal to 10% difference sec for decreased fall risk.  Baseline: 16.53, 20.22 sec Goal status: IN PROGRESS  4.  FOG score to decrease to 7 or less to demonstrate  decrease freezing episodes Baseline: 10 Goal status: IN PROGRESS   LONG TERM GOALS: Target date: 02/12/2023  Pt will be independent with HEP for improved balance and gait.  Baseline:  Goal status: IN PROGRESS  2.  Pt will improve MiniBESTest score to at least 22/28 to decrease fall risk. Baseline: 18/28 Goal status: IN PROGRESS  3.  Pt will verbalize understanding of local Parkinson's disease community resources.  Baseline:  Goal status: IN PROGRESS  ASSESSMENT:  CLINICAL IMPRESSION: Patient arrived to session without new complaints. Patient demonstrated standing PWR! Moves well; able to sequence them together for greater coordination challenge. Worked on multidirectional stepping and walking with cueing for pacing and length of step. Turning activities demonstrated decreased festination and more confidence when turning with walking. Patient tolerated session without complaints.   OBJECTIVE IMPAIRMENTS: Abnormal gait, decreased balance, decreased mobility, difficulty walking, decreased strength, and postural dysfunction.   ACTIVITY LIMITATIONS: standing, transfers, and locomotion level  PARTICIPATION LIMITATIONS: shopping, community activity, and community fitness  PERSONAL FACTORS: 1-2 comorbidities: hx of seizure, foot surgeries  are also affecting  patient's functional outcome.   REHAB POTENTIAL: Good  CLINICAL DECISION MAKING: Evolving/moderate complexity  EVALUATION COMPLEXITY: Moderate  PLAN:  PT FREQUENCY: 2x/week  PT DURATION: 6 weeks plus eval  PLANNED INTERVENTIONS: Therapeutic exercises, Therapeutic activity, Neuromuscular re-education, Balance training, Gait training, Patient/Family education, and Self Care  PLAN FOR NEXT SESSION:  DC vs. Schedule more appts based on pt's decision;  gait and turns with wider BOS, sidestepping; add cognitive and carry tasks to improve complexity for improved carryover to home.  Anette Guarneri, PT, DPT 01/18/23 11:02 AM  [facility] at [facility] [address] [place], [address] [place], [place] [address] Phone # [phone] Fax # [phone]

## 2023-01-18 ENCOUNTER — Ambulatory Visit: Payer: PPO | Admitting: Physical Therapy

## 2023-01-18 ENCOUNTER — Encounter: Payer: Self-pay | Admitting: Physical Therapy

## 2023-01-18 DIAGNOSIS — R2689 Other abnormalities of gait and mobility: Secondary | ICD-10-CM | POA: Diagnosis not present

## 2023-01-18 DIAGNOSIS — M6281 Muscle weakness (generalized): Secondary | ICD-10-CM

## 2023-01-18 DIAGNOSIS — R2681 Unsteadiness on feet: Secondary | ICD-10-CM

## 2023-01-18 DIAGNOSIS — R293 Abnormal posture: Secondary | ICD-10-CM

## 2023-01-18 DIAGNOSIS — R29818 Other symptoms and signs involving the nervous system: Secondary | ICD-10-CM

## 2023-01-18 NOTE — Patient Instructions (Addendum)
HEP: -Seated PWR! Moves 2x10 daily  -Standing PWR! Moves 1 x 10 reps daily -Standing PWR! Moves standing flow 5x  Perform a sequence of each of the standing moves in a row: Up > Rock L/R> Twist L/R> Step L/R  -practice 1/2 turns near a counter top in 4 steps 2x5 each side, then perform walking with turns in a hallway 2x5 each side

## 2023-01-22 NOTE — Therapy (Signed)
OUTPATIENT PHYSICAL THERAPY NEURO PROGRESS NOTE   Patient Name: Chelsea Conley MRN: [id number] DOB:July 26, 1950, 72 y.o., female Today's Date: 01/25/2023   PCP: Joycelyn Rua, MD REFERRING PROVIDER:    Vladimir Faster, DO    Progress Note Reporting Period 01/01/23 to 01/25/23  See note below for Objective Data and Assessment of Progress/Goals.    END OF SESSION:  PT End of Session - 01/25/23 1105     Visit Number 6    Number of Visits 13    Date for PT Re-Evaluation 02/12/23    Authorization Type HTA    Progress Note Due on Visit 10    PT Start Time 1024    PT Stop Time 1059    PT Time Calculation (min) 35 min    Activity Tolerance Patient tolerated treatment well    Behavior During Therapy WFL for tasks assessed/performed                Past Medical History:  Diagnosis Date   History of seizures    History of tremor    Parkinson disease 05/15/2015   Past Surgical History:  Procedure Laterality Date   FOOT SURGERY Bilateral    MOUTH SURGERY     X2   TONSILLECTOMY     Patient Active Problem List   Diagnosis Date Noted   Arthritis of carpometacarpal [facility]) joint of right thumb 12/21/2018   HLD (hyperlipidemia) 05/15/2015   Seizure (HCC) 05/15/2015   Has a tremor 05/15/2015   Parkinson disease 05/15/2015    ONSET DATE: 12/10/2022 (MD referral)  REFERRING DIAG: G20.A2 (ICD-10-CM) - Parkinson's disease without dyskinesia, with fluctuating manifestations   THERAPY DIAG:  Other abnormalities of gait and mobility  Unsteadiness on feet  Muscle weakness (generalized)  Other symptoms and signs involving the nervous system  Abnormal posture  Rationale for Evaluation and Treatment: Rehabilitation  SUBJECTIVE:                                                                                                                                                                                             SUBJECTIVE STATEMENT: Reports that on Wednesday she was  doing her PWR! Moves in sitting and the L hip did something and hurt when she tried to stand up. The next day she felt okay and was able to perform her Rock steady boxing. Saturday AM she woke up she had pain when trying to stand up. The L hip hurts when reaching overhead too. This pain seems to get better the longer the day goes on. Denies N/T B&B changes.   Pt accompanied by: self  PERTINENT HISTORY: Parkinsons  Disease, sx's since at least 2015   PAIN:  Are you having pain? Yes: NPRS scale: 5/10 Pain location: L lateral hip Pain description: shooting down the L LE to the ankle Aggravating factors: in the AM, reaching overhead Relieving factors: heat/ice, as the day goes on  PRECAUTIONS: Fall  WEIGHT BEARING RESTRICTIONS: No  FALLS: Has patient fallen in last 6 months? 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 Moves 2x10 daily  -Standing PWR! Moves 1 x 10 reps daily -Standing PWR! Moves standing flow 5x  Perform a sequence of each of the standing moves in a row: Up > Rock L/R> Twist L/R> Step L/R -practice 1/2 turns near a counter top in 4 steps 2x5 each side, then perform  walking with turns in a hallway 2x5 each side -sitting/supine KTOS stretch 2x30"   PATIENT EDUCATION: Education details: edu on PRICE for L hip; edu on PD community resources; progress towards goals, 30 day hold, HEP update Person educated: Patient Education method: Explanation, Demonstration, Tactile cues, Verbal cues, and Handouts Education comprehension: verbalized understanding and returned demonstration    Below measures were taken at time of initial evaluation unless otherwise specified:   DIAGNOSTIC FINDINGS: NA for this episode  COGNITION: Overall cognitive status: Within functional limits for tasks assessed *Slowed mobility with dual cognitive task  SENSATION: Light touch: WFL  COORDINATION: Slightly slowed alternating tapping  MUSCLE TONE: LLE: Mild  POSTURE: rounded shoulders, forward head, and some trunk dyskinesia  LOWER EXTREMITY ROM:   A/ROM WFL    LOWER EXTREMITY MMT:    MMT Right Eval Left Eval  Hip flexion 4 4  Hip extension    Hip abduction    Hip adduction    Hip internal rotation    Hip external rotation    Knee flexion 4+ 4+  Knee extension 4+ 4+  Ankle dorsiflexion 4 4  Ankle plantarflexion    Ankle inversion    Ankle eversion    (Blank rows = not tested)  TRANSFERS: Assistive device utilized: None  Sit to stand: Modified independence Stand to sit: Modified independence  GAIT: Gait pattern: step through pattern, decreased arm swing- Right, decreased arm swing- Left, trunk flexed, and narrow BOS Distance walked: 50 ft Assistive device utilized: None Level of assistance: Modified independence Comments: no freezing noted in eval, but narrow BOS noted upon standing and initiating gait  FUNCTIONAL TESTS:  5 times sit to stand: 17.19 sec Timed up and go (TUG): 16.53 sec 10 meter walk test: 11.07 sec= 2.96 ft/sec MiniBESTest: 18/28 TUG cognitive:  20.22 sec TUG manual:  16.97 sec  PATIENT SURVEYS:  Freezing of gait  questionnaire 10 Item 1  1 Item 2  1 Item 3  3 Item 4  2 Item 5  2 Item 6  1 TODAY'S TREATMENT:                                                                                                                              DATE: 01/01/2023    PATIENT EDUCATION: Education details: Eval results, POC, initial tips to decrease freezing episodes (wide BOS, weightshifting, stop and reset) Person educated: Patient Education method: Explanation and Demonstration Education comprehension: verbalized understanding and returned demonstration  HOME EXERCISE PROGRAM: Not yet initiated  GOALS: Goals reviewed with patient? Yes  SHORT TERM GOALS: Target date: 01/29/2023  Pt will be independent with HEP for improved balance, gait.  Baseline: Goal status: MET 01/25/23  2.  Pt will improve 5x sit<>stand to less than or equal to 12.5 sec to demonstrate improved functional strength and transfer efficiency.  Baseline: 17.19 sec; 16.95 sec 01/25/23 Goal status: IN PROGRESS 01/25/23  3.  Pt will improve TUG/TUG cognitive score to less than or equal to 10% difference sec for decreased fall risk.  Baseline: 16.53, 20.22 sec; 14.83 sec/18.01 sec 01/25/23 Goal status: IN PROGRESS  01/25/23  4.  FOG score to decrease to 7 or less to demonstrate  decrease freezing episodes Baseline: 10; 11/24 01/25/23 Goal status: IN PROGRESS 01/25/23   LONG TERM GOALS: Target date: 02/12/2023  Pt will be independent with HEP for improved balance and gait.  Baseline:  Goal status: MET 01/25/23  2.  Pt will improve MiniBESTest score to at least 22/28 to decrease fall risk. Baseline: 18/28; NT d/t time 01/25/23 Goal status: DEFERRED 01/25/23  3.  Pt will verbalize understanding of local Parkinson's disease community resources.  Baseline: reported understanding  01/25/23 Goal status: MET  01/25/23  ASSESSMENT:  CLINICAL IMPRESSION: Patient arrived to session with report of L hip pain which started on Wednesday when performing  HEP. Reports that she felt fine until she woke up on Saturday morning after [employer]. Patient with palpable trigger point over L lateral glute; addressed with STM and stretching. Speed with 5xSTS and TUG has improved however quality of gait and speed of gait decreased when adding a cognitive balance challenge. Patient reported understanding of HEP update and PD community resources. Patient requesting 30 day hold at this time.   OBJECTIVE IMPAIRMENTS: Abnormal gait, decreased balance, decreased mobility, difficulty walking, decreased strength, and postural dysfunction.   ACTIVITY LIMITATIONS: standing, transfers, and locomotion level  PARTICIPATION LIMITATIONS: shopping, community activity, and community fitness  PERSONAL FACTORS: 1-2 comorbidities: hx of seizure, foot surgeries  are also affecting patient's functional outcome.   REHAB POTENTIAL: Good  CLINICAL DECISION MAKING: Evolving/moderate complexity  EVALUATION COMPLEXITY: Moderate  PLAN:  PT FREQUENCY: 2x/week  PT DURATION: 6 weeks plus eval  PLANNED INTERVENTIONS: Therapeutic exercises, Therapeutic activity, Neuromuscular re-education, Balance training, Gait training, Patient/Family education, and Self Care  PLAN FOR NEXT  SESSION:  30 day hold at this time  Anette Guarneri, [place], DPT 01/25/23 11:05 AM  [facility] at [facility] [address] [place], [address] [place], [place] [address] Phone # [phone] Fax # [phone]

## 2023-01-25 ENCOUNTER — Encounter: Payer: Self-pay | Admitting: Physical Therapy

## 2023-01-25 ENCOUNTER — Ambulatory Visit: Payer: PPO | Admitting: Physical Therapy

## 2023-01-25 DIAGNOSIS — R2681 Unsteadiness on feet: Secondary | ICD-10-CM

## 2023-01-25 DIAGNOSIS — R2689 Other abnormalities of gait and mobility: Secondary | ICD-10-CM | POA: Diagnosis not present

## 2023-01-25 DIAGNOSIS — R29818 Other symptoms and signs involving the nervous system: Secondary | ICD-10-CM

## 2023-01-25 DIAGNOSIS — M6281 Muscle weakness (generalized): Secondary | ICD-10-CM

## 2023-01-25 DIAGNOSIS — R293 Abnormal posture: Secondary | ICD-10-CM

## 2023-02-18 ENCOUNTER — Ambulatory Visit: Payer: PPO | Admitting: Neurology

## 2023-03-01 ENCOUNTER — Other Ambulatory Visit: Payer: Self-pay | Admitting: Neurology

## 2023-03-01 DIAGNOSIS — G20A1 Parkinson's disease without dyskinesia, without mention of fluctuations: Secondary | ICD-10-CM

## 2023-03-01 DIAGNOSIS — G20B1 Parkinson's disease with dyskinesia, without mention of fluctuations: Secondary | ICD-10-CM

## 2023-03-10 DIAGNOSIS — Z124 Encounter for screening for malignant neoplasm of cervix: Secondary | ICD-10-CM | POA: Diagnosis not present

## 2023-03-10 DIAGNOSIS — Z1231 Encounter for screening mammogram for malignant neoplasm of breast: Secondary | ICD-10-CM | POA: Diagnosis not present

## 2023-03-10 DIAGNOSIS — Z1151 Encounter for screening for human papillomavirus (HPV): Secondary | ICD-10-CM | POA: Diagnosis not present

## 2023-03-10 DIAGNOSIS — Z6825 Body mass index (BMI) 25.0-25.9, adult: Secondary | ICD-10-CM | POA: Diagnosis not present

## 2023-03-10 DIAGNOSIS — M816 Localized osteoporosis [Lequesne]: Secondary | ICD-10-CM | POA: Diagnosis not present

## 2023-03-10 DIAGNOSIS — N958 Other specified menopausal and perimenopausal disorders: Secondary | ICD-10-CM | POA: Diagnosis not present

## 2023-03-17 ENCOUNTER — Telehealth: Payer: Self-pay | Admitting: Neurology

## 2023-03-17 NOTE — Telephone Encounter (Signed)
Rytary dosage is 245mg  tid. Doing well, she stated. Needs updated Rx or samples pharmacy is correct. Please advise.

## 2023-03-17 NOTE — Telephone Encounter (Signed)
Samples are up front for this patient

## 2023-03-17 NOTE — Telephone Encounter (Signed)
Caller stated she has questions about medication amantadine (SYMMETREL) and would like to speak with nurse

## 2023-03-19 DIAGNOSIS — M5442 Lumbago with sciatica, left side: Secondary | ICD-10-CM | POA: Diagnosis not present

## 2023-03-24 ENCOUNTER — Other Ambulatory Visit (INDEPENDENT_AMBULATORY_CARE_PROVIDER_SITE_OTHER): Payer: PPO

## 2023-03-24 ENCOUNTER — Ambulatory Visit: Payer: PPO | Admitting: Orthopaedic Surgery

## 2023-03-24 ENCOUNTER — Encounter: Payer: Self-pay | Admitting: Orthopaedic Surgery

## 2023-03-24 DIAGNOSIS — M7062 Trochanteric bursitis, left hip: Secondary | ICD-10-CM

## 2023-03-24 DIAGNOSIS — M79605 Pain in left leg: Secondary | ICD-10-CM

## 2023-03-24 DIAGNOSIS — M5442 Lumbago with sciatica, left side: Secondary | ICD-10-CM

## 2023-03-24 MED ORDER — CELECOXIB 200 MG PO CAPS: 200.0000 mg | ORAL_CAPSULE | Freq: Two times a day (BID) | ORAL | 1 refills | Status: DC | PRN

## 2023-03-24 NOTE — Progress Notes (Signed)
The patient is a 72 year old female with Parkinson disease who comes in with acute left lower extremity pain and radicular symptoms.  She points to the sciatic area but also somewhat of the trochanteric area on the left side as a source of her pain and it does radiate to her foot with no numbness and tingling.  She denies any back pain.  She denies any right-sided issues.  It does wake her up at night.  She was recently started on meloxicam and Zanaflex and this has not really helped much at all.  Her husband is with her today as well.  She does have a shuffling gait and a tremor from Parkinson's.  She has a negative straight leg raise bilaterally.  There is some pain over the left hip trochanteric area but no blocks to rotation of the left hip at all.  There are some pain on the ischial area in the sciatic area on the left side.  2 views of the lumbar spine actually show some degenerative changes but no acute findings.  I can see both femoral heads and her hips look good.  I would like to try Celebrex instead of meloxicam which will be 200 mg twice a day.  I did place a steroid injection over the left hip trochanteric.  To see if this will help with some of the component of her pain.  We will see her back in 2 weeks to see how she is doing overall and to get a better idea of what else may be affecting this.  I did offer physical therapy but she wants to wait to see what she looks like in 2 weeks.

## 2023-03-30 ENCOUNTER — Other Ambulatory Visit: Payer: Self-pay | Admitting: Orthopaedic Surgery

## 2023-03-30 ENCOUNTER — Telehealth: Payer: Self-pay | Admitting: Orthopaedic Surgery

## 2023-03-30 ENCOUNTER — Other Ambulatory Visit: Payer: Self-pay

## 2023-03-30 DIAGNOSIS — M5442 Lumbago with sciatica, left side: Secondary | ICD-10-CM

## 2023-03-30 MED ORDER — TRAMADOL HCL 50 MG PO TABS: 50.0000 mg | ORAL_TABLET | Freq: Four times a day (QID) | ORAL | 0 refills | Status: DC | PRN

## 2023-03-30 NOTE — Telephone Encounter (Signed)
Patient called and said her shot in her hip didn't work and she is in bad pain. CB#[phone]

## 2023-03-30 NOTE — Telephone Encounter (Signed)
Patient aware this was called in for her and MRI order was put in

## 2023-04-02 NOTE — Progress Notes (Unsigned)
Assessment/Plan:    Parkinsons Disease, sx's since at least 2015 -Continue Rytary 245 mg, 1 po 8am/noon/4pm.  She looked a tad underdosed today, but overall she had been feeling well until she hurt her back so we decided not to change anything.  Discussed with her that we may need to increase further by increasing number of capsules.   2.  Parkinson's dyskinesia             -Patient to continue amantadine, 100 mg 3 times per day.  She is to dose these with Rytary.  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   3.  RBD             -Overall mild and no additional medication required.   4.  Cervical dystonia             -Patient not interested in Botox.  5  dysphagia  -Modified barium evaluation done on August 13, 2021.  There was evidence of mild oropharyngeal dysphagia.  Regular diet with thin liquids recommended.  6.  Memory change  -discussed neurocog testing and they decided to schedule that.  She is not scheduled out until March, 2025  7.  Weight loss/insomnia  -she is off of the mirtazapine.  She thought it made the dreaming worse.  8.  Low back pain with suspected lumbar radiculopathy  -Following with orthopedics  -MRI pending  -walker until above is completed.  Given information to the [employer] for free DME equipment. Subjective:   Chelsea Conley was seen today in follow up for Parkinsons disease.  My previous records were reviewed prior to todays visit as well as outside records available to me.  Pt with daughter who supplements hx. last visit.  Last visit, we increase her Rytary dosage.  I did tell her we may need to increase the number of capsules in the future.  She did call me back just to let me know she was doing well with the current dosage.  She and I discussed that freezing is generally not medication responsive, however.  She went to few sessions of physical therapy after our last visit.  It looks  like recently she began to suffer acute lumbar radiculopathy, and started to see orthopedics.  They started her on Celebrex and gave her an injection of IM steroids.  She called them back and was given some tramadol.  They also ordered an MRI.  She reports that she hurt herself during her PWR Moves exercises at home and has been having trouble moving since that time.  Its in the L buttocks and radiating now down the left leg to the calf.  Because of the pain, she has had 2 falls.  Current prescribed movement disorder medications: Rytary 245 mg, 1 at 8 AM/noon/4 PM (increased last visit) Amantadine, 100 mg 3 times per day  Prior medications: Carbidopa/levodopa IR (nausea/vomiting); selegiline (discontinued because of cost); carbidopa/levodopa CR (still with some nausea); mirtazapine (she thought it caused more dreams  ALLERGIES:  No Known Allergies  CURRENT MEDICATIONS:  Current Meds  Medication Sig   amantadine (SYMMETREL) 100 MG capsule TAKE ONE CAPSULE BY MOUTH EVERY MORNING, AT NOON AND EVERYDAY AT BEDTIME   Carbidopa-Levodopa ER (RYTARY) 61.25-245 MG CPCR Samples of this drug were given to the patient, quantity [id number] A Exp:09/24 & [id number] A Exp:011/25 Take 8am/noon/4pm   celecoxib (CELEBREX) 200 MG capsule Take 1 capsule (200 mg total) by mouth  2 (two) times daily as needed.   traMADol (ULTRAM) 50 MG tablet Take 1-2 tablets (50-100 mg total) by mouth every 6 (six) hours as needed.     Objective:   PHYSICAL EXAMINATION:    VITALS:   Vitals:   04/05/23 1357  BP: 126/82  Pulse: 91  SpO2: 99%  Weight: 134 lb (60.8 kg)  Height: 5\' 1"  (1.549 m)    Wt Readings from Last 3 Encounters:  04/05/23 134 lb (60.8 kg)  12/10/22 134 lb (60.8 kg)  08/18/22 134 lb 12.8 oz (61.1 kg)      GEN:  The patient appears stated age and is in NAD. HEENT:  Normocephalic, atraumatic.  The mucous membranes are moist. The superficial temporal arteries are without ropiness or tenderness. CV:   RRR Lungs:  CTAB Neck/HEME:  There are no carotid bruits bilaterally.  Neurological examination:  Orientation: The patient is alert and oriented x3. Cranial nerves: There is good facial symmetry with mild facial hypomimia. The speech is fluent and clear. Soft palate rises symmetrically and there is no tongue deviation. Hearing is intact to conversational tone. Sensation: Sensation is intact to light touch throughout Motor: Strength is at least antigravity x4.   Movement examination: Tone: There is mild increased tone in left upper extremity.  There is normal tone on the right. Abnormal movements: There is left upper extremity rest tremor. Coordination:  There is mild decremation on the left. Gait and Station: Not tested today as she is in a transport chair and has acute back pain that is completely relieved when she sits  I have reviewed and interpreted the following labs independently    Chemistry      Component Value Date/Time   NA 139 12/10/2022 1135   NA 143 11/21/2019 1046   K 4.8 12/10/2022 1135   CL 104 12/10/2022 1135   CO2 27 12/10/2022 1135   BUN 23 12/10/2022 1135   BUN 15 11/21/2019 1046   CREATININE 0.86 12/10/2022 1135      Component Value Date/Time   CALCIUM 9.6 12/10/2022 1135   ALKPHOS 94 12/10/2022 1135   AST 17 12/10/2022 1135   ALT 3 12/10/2022 1135   BILITOT 0.4 12/10/2022 1135   BILITOT 0.4 11/21/2019 1046       Lab Results  Component Value Date   WBC 5.7 12/10/2022   HGB 13.2 12/10/2022   HCT 40.1 12/10/2022   MCV 88.0 12/10/2022   PLT 257.0 12/10/2022    No results found for: "TSH"   Total time spent on today's visit was 30 minutes, including both face-to-face time and nonface-to-face time.  Time included that spent on review of records (prior notes available to me/labs/imaging if pertinent), discussing treatment and goals, answering patient's questions and coordinating care.  Cc:  Joycelyn Rua, MD

## 2023-04-05 ENCOUNTER — Ambulatory Visit: Payer: PPO | Admitting: Neurology

## 2023-04-05 ENCOUNTER — Encounter: Payer: Self-pay | Admitting: Neurology

## 2023-04-05 ENCOUNTER — Telehealth: Payer: Self-pay | Admitting: Orthopaedic Surgery

## 2023-04-05 VITALS — BP 126/82 | HR 91 | Ht 61.0 in | Wt 134.0 lb

## 2023-04-05 DIAGNOSIS — M5416 Radiculopathy, lumbar region: Secondary | ICD-10-CM | POA: Diagnosis not present

## 2023-04-05 DIAGNOSIS — G20A2 Parkinson's disease without dyskinesia, with fluctuations: Secondary | ICD-10-CM | POA: Diagnosis not present

## 2023-04-05 DIAGNOSIS — M79605 Pain in left leg: Secondary | ICD-10-CM

## 2023-04-05 NOTE — Telephone Encounter (Signed)
Per Dr. Eliberto Ivory last note he suggested PT. I called and advised pt and she would like to try this. PT order placed in chart

## 2023-04-05 NOTE — Telephone Encounter (Signed)
Patient called and said she has pain all down her leg. None of the medications are working. She just wants to know what to do? CB#[phone]

## 2023-04-05 NOTE — Addendum Note (Signed)
Addended by: Barbette Or on: 04/05/2023 03:42 PM   Modules accepted: Orders

## 2023-04-05 NOTE — Patient Instructions (Addendum)
We discussed a nonprofit medical company called [employer].  They have all kinds of medical equipment including wheelchairs, walkers, canes and incontinence materials, which is free of charge.  Their phone number is [phone].  Their email is [email or web address].  Feel free to reach out to them with questions.  I believe that they are only open for pick up on Tuesdays but you can contact them other days of the week.  SAVE THE DATE!  We are planning a Parkinsons Disease educational symposium at [facility] in [place] on October 11.   To sign up, you can email [email or web address].  We hope to see you there!   The physicians and staff at [facility] are committed to providing excellent care. You may receive a survey requesting feedback about your experience at our office. We strive to receive "very good" responses to the survey questions. If you feel that your experience would prevent you from giving the office a "very good " response, please contact our office to try to remedy the situation. We may be reached at [phone]. Thank you for taking the time out of your busy day to complete the survey.

## 2023-04-08 ENCOUNTER — Other Ambulatory Visit: Payer: Self-pay

## 2023-04-08 ENCOUNTER — Encounter: Payer: Self-pay | Admitting: Physical Therapy

## 2023-04-08 ENCOUNTER — Ambulatory Visit: Payer: PPO | Admitting: Physical Therapy

## 2023-04-08 DIAGNOSIS — M79605 Pain in left leg: Secondary | ICD-10-CM | POA: Diagnosis not present

## 2023-04-08 DIAGNOSIS — M6281 Muscle weakness (generalized): Secondary | ICD-10-CM

## 2023-04-08 DIAGNOSIS — R2689 Other abnormalities of gait and mobility: Secondary | ICD-10-CM | POA: Diagnosis not present

## 2023-04-08 DIAGNOSIS — R2681 Unsteadiness on feet: Secondary | ICD-10-CM

## 2023-04-08 DIAGNOSIS — M5459 Other low back pain: Secondary | ICD-10-CM | POA: Diagnosis not present

## 2023-04-08 NOTE — Therapy (Signed)
OUTPATIENT PHYSICAL THERAPY EVALUATION   Patient Name: Chelsea Conley MRN: [id number] DOB:1950-11-18, 72 y.o., female Today's Date: 04/08/2023  END OF SESSION:  PT End of Session - 04/08/23 1442     Visit Number 1    Number of Visits 12    Date for PT Re-Evaluation 05/20/23    Authorization Type H[employer] $20 copay    Progress Note Due on Visit 10    PT Start Time 1335    PT Stop Time 1410    PT Time Calculation (min) 35 min    Activity Tolerance Patient tolerated treatment well    Behavior During Therapy [facility] for tasks assessed/performed             Past Medical History:  Diagnosis Date   History of seizures    History of tremor    Parkinson disease 05/15/2015   Past Surgical History:  Procedure Laterality Date   FOOT SURGERY Bilateral    MOUTH SURGERY     X2   TONSILLECTOMY     Patient Active Problem List   Diagnosis Date Noted   Arthritis of carpometacarpal [facility]) joint of right thumb 12/21/2018   HLD (hyperlipidemia) 05/15/2015   Seizure (HCC) 05/15/2015   Has a tremor 05/15/2015   Parkinson disease 05/15/2015    PCP: Joycelyn Rua, MD  REFERRING PROVIDER: Kathryne Hitch*  REFERRING DIAG: G29.528 (ICD-10-CM) - Pain in left leg  Rationale for Evaluation and Treatment: Rehabilitation  THERAPY DIAG:  Pain in left leg - Plan: PT plan of care cert/re-cert  Other low back pain - Plan: PT plan of care cert/re-cert  Muscle weakness (generalized) - Plan: PT plan of care cert/re-cert  Other abnormalities of gait and mobility - Plan: PT plan of care cert/re-cert  Unsteadiness on feet - Plan: PT plan of care cert/re-cert  ONSET DATE: 03/24/23   SUBJECTIVE:                                                                                                                                                                                           SUBJECTIVE STATEMENT: Pt reports about 2 week history of sudden onset of Lt glute pain radiating into LLE.  She  had injection which gave her about 2 days of relief but then pain returned.  Pt reports pain was unbearable until yesterday but now it seems to have improved.  PERTINENT HISTORY:  HLD, hx seizures, Parkinson's Disease  PAIN:  Are you having pain? Yes: NPRS scale: 0 currently, up to 10/10 Pain location: Lt glute to ankle Pain description: sharp, stabbing Aggravating factors: reaching overhead; trying to stand up straight  Relieving  factors: medication, standing in forward flexed posture  PRECAUTIONS:  Fall  RED FLAGS: None   WEIGHT BEARING RESTRICTIONS:  No  FALLS:  Has patient fallen in last 6 months? Yes. Number of falls 2; one this week - Dr Tat suggested use of walker  LIVING ENVIRONMENT: Lives with: lives with their spouse Lives in: House/apartment Stairs:  single step to enter Has following equipment at home: Dan Humphreys - 2 wheeled  OCCUPATION:  Retired   PLOF:  Independent and Leisure: Curator, shopping, go to church  PATIENT GOALS:  Stand and walk without pain   OBJECTIVE:   DIAGNOSTIC FINDINGS:  X-rays:  There is degenerative changes.  PATIENT SURVEYS:  04/08/23 FOTO 22 (predicted 49)  COGNITIVE STATUS: Within functional limits for tasks assessed   SENSATION: WFL  POSTURE:  rounded shoulders, forward head, and increased thoracic kyphosis  GAIT: 04/08/23 Comments: Pt independent with ambulation; has occasional episodes of shuffling and decreased initiation observed within clinic  PALPATION: 04/08/23: pain with trigger points in Lt glute and piriformis  LUMBAR ROM:   Active  A/PROM  eval  Flexion WNL (directional preference)  Extension Limited 20%  Right quadrant WNL  Left quadrant WNL   (Blank rows = not tested)    LOWER EXTREMITY MMT:    MMT Right eval Left eval  Hip flexion    Hip extension    Hip abduction 4/5 3/5  Hip adduction    Hip internal rotation    Hip external rotation    Knee flexion    Knee extension     Ankle dorsiflexion    Ankle plantarflexion    Ankle inversion    Ankle eversion     (Blank rows = not tested)    SPECIAL TESTS:  04/08/23 Lumbar Slump test: Negative  TREATMENT:                                                                                                                              DATE:  04/08/23 See HEP - trial reps performed with min cues needed     PATIENT EDUCATION:  Education details: HEP Person educated: Patient Education method: Explanation, Demonstration, and Handouts Education comprehension: verbalized understanding, returned demonstration, and needs further education  HOME EXERCISE PROGRAM: Access Code: [email or web address] URL: [email or web address] Date: 04/08/2023 Prepared by: Moshe Cipro  Exercises - Supine Double Knee to Chest  - 2 x daily - 7 x weekly - 1 sets - 3 reps - 30 sec hold - Hooklying Single Knee to Chest  - 2 x daily - 7 x weekly - 1 sets - 3 reps - 30 sec hold - Supine Piriformis Stretch with Foot on Ground  - 2 x daily - 7 x weekly - 1 sets - 3 reps - 30 sec hold - Sidelying Hip Circles  - 2 x daily - 7 x weekly - 2 sets - 10 circles each way - Standing Hip Extension  - 2 x  daily - 7 x weekly - 2 sets - 10 reps - Self Release   - 2-3 x daily - 7 x weekly - 1 sets - 1 reps - 2-3 min hold   ASSESSMENT:  CLINICAL IMPRESSION: Patient is a 72 y.o. female who was seen today for physical therapy evaluation and treatment for Lt back and hip pain that radiates down LLE. She demonstrates decreased strength and ROM as well as trigger points and continued pain affecting functional mobility.  She will benefit from PT to address deficits listed.     OBJECTIVE IMPAIRMENTS: Abnormal gait, decreased balance, decreased mobility, difficulty walking, decreased ROM, decreased strength, increased fascial restrictions, increased muscle spasms, impaired flexibility, postural dysfunction, and pain.   ACTIVITY LIMITATIONS: carrying,  lifting, bending, sitting, standing, transfers, bed mobility, and locomotion level  PARTICIPATION LIMITATIONS: meal prep, cleaning, laundry, shopping, and community activity  PERSONAL FACTORS: Age and 3+ comorbidities: HLD, hx seizures, Parkinson's Disease  are also affecting patient's functional outcome.   REHAB POTENTIAL: Good  CLINICAL DECISION MAKING: Evolving/moderate complexity  EVALUATION COMPLEXITY: Moderate   GOALS: Goals reviewed with patient? Yes  SHORT TERM GOALS: Target date: 04/29/2023   Independent with initial HEP Goal status: INITIAL   LONG TERM GOALS: Target date: 05/20/2023  Independent with final HEP Goal status: INITIAL  2.  FOTO score improved to 49 Goal status: INITIAL  3.  Lt hip abduction strength improved to 4/5 for improved function and mobility Goal status: INIITAL  4.  Report pain < 3/10 with standing and walking activities for improved function Goal status: INITIAL  5.  Demonstrate lumbar ROM without pain or limitation Goal status: INITIAL  PLAN:  PT FREQUENCY: 1-2x/week  PT DURATION: 6 weeks  PLANNED INTERVENTIONS: Therapeutic exercises, Therapeutic activity, Neuromuscular re-education, Balance training, Gait training, Patient/Family education, Self Care, Joint mobilization, DME instructions, Aquatic Therapy, Dry Needling, Electrical stimulation, Spinal manipulation, Spinal mobilization, Cryotherapy, Moist heat, Taping, Traction, Manual therapy, and Re-evaluation.  PLAN FOR NEXT SESSION: review HEP, consider DN, needs hip/core strengthening   NEXT MD VISIT: 04/12/23    Clarita Crane, PT, DPT 04/08/23 2:44 PM

## 2023-04-12 ENCOUNTER — Ambulatory Visit: Payer: PPO | Admitting: Orthopaedic Surgery

## 2023-04-12 ENCOUNTER — Encounter: Payer: Self-pay | Admitting: Orthopaedic Surgery

## 2023-04-12 DIAGNOSIS — M7062 Trochanteric bursitis, left hip: Secondary | ICD-10-CM

## 2023-04-12 DIAGNOSIS — M5442 Lumbago with sciatica, left side: Secondary | ICD-10-CM

## 2023-04-12 DIAGNOSIS — M79605 Pain in left leg: Secondary | ICD-10-CM | POA: Diagnosis not present

## 2023-04-12 MED ORDER — METHYLPREDNISOLONE 4 MG PO TABS: ORAL_TABLET | ORAL | 0 refills | Status: DC

## 2023-04-12 MED ORDER — BACLOFEN 10 MG PO TABS: 10.0000 mg | ORAL_TABLET | Freq: Three times a day (TID) | ORAL | 1 refills | Status: DC | PRN

## 2023-04-12 NOTE — Progress Notes (Signed)
The patient is a 72 year old with Parkinson disease who comes in 2 weeks after I provided a steroid injection over her left hip trochanteric area.  She is of that really helped only about 2 to 3 days.  She has had 1 physical therapy session and she has had several falls since I saw her last.  Her husband cannot pick her up easily because he has a significant shoulder issue and is having surgery at the end of the month on his shoulder.  We have ordered a MRI of her lumbar spine and we will wait and the results of this.  She has not had the MRI yet.  She is been having radicular symptoms that go down to her foot and ankle.  When she stays stooped over it takes pressure off of her back and causes the pain to lessen down her left leg.  She is not a diabetic either.  On exam there is some pain over the trochanteric area and she still has a positive straight leg raise on the left side with left-sided lower extremity weakness.  I will try baclofen as a muscle relaxant and a steroid taper in the interim while we await the MRI of her lumbar spine and then we can go from there in terms of what further treatment may be recommended.

## 2023-04-13 ENCOUNTER — Encounter: Payer: Self-pay | Admitting: Physical Therapy

## 2023-04-13 ENCOUNTER — Ambulatory Visit: Payer: PPO | Admitting: Physical Therapy

## 2023-04-13 DIAGNOSIS — R2681 Unsteadiness on feet: Secondary | ICD-10-CM

## 2023-04-13 DIAGNOSIS — M5459 Other low back pain: Secondary | ICD-10-CM | POA: Diagnosis not present

## 2023-04-13 DIAGNOSIS — M79605 Pain in left leg: Secondary | ICD-10-CM

## 2023-04-13 DIAGNOSIS — M6281 Muscle weakness (generalized): Secondary | ICD-10-CM | POA: Diagnosis not present

## 2023-04-13 DIAGNOSIS — R2689 Other abnormalities of gait and mobility: Secondary | ICD-10-CM

## 2023-04-13 NOTE — Therapy (Addendum)
OUTPATIENT PHYSICAL THERAPY   Patient Name: Chelsea Conley MRN: [id number] DOB:05/14/1951, 72 y.o., female Today's Date: 04/13/2023  END OF SESSION:  PT End of Session - 04/13/23 1318     Visit Number 2    Number of Visits 12    Date for PT Re-Evaluation 05/20/23    Progress Note Due on Visit 10    PT Start Time 1302    PT Stop Time 1340    PT Time Calculation (min) 38 min    Activity Tolerance Patient tolerated treatment well    Behavior During Therapy WFL for tasks assessed/performed              Past Medical History:  Diagnosis Date   History of seizures    History of tremor    Parkinson disease (HCC) 05/15/2015   Past Surgical History:  Procedure Laterality Date   FOOT SURGERY Bilateral    MOUTH SURGERY     X2   TONSILLECTOMY     Patient Active Problem List   Diagnosis Date Noted   Arthritis of carpometacarpal [facility]) joint of right thumb 12/21/2018   HLD (hyperlipidemia) 05/15/2015   Seizure (HCC) 05/15/2015   Has a tremor 05/15/2015   Parkinson disease (HCC) 05/15/2015    PCP: Joycelyn Rua, MD  REFERRING PROVIDER: Kathryne Hitch*  REFERRING DIAG: C16.606 (ICD-10-CM) - Pain in left leg  Rationale for Evaluation and Treatment: Rehabilitation  THERAPY DIAG:  Pain in left leg  Other low back pain  Muscle weakness (generalized)  Other abnormalities of gait and mobility  Unsteadiness on feet  ONSET DATE: 03/24/23   SUBJECTIVE:                                                                                                                                                                                           SUBJECTIVE STATEMENT: 04/13/2023: pt arriving today reporting 9/10 pain in her left hip when standing. Pt reporting today is a bad day.   Eval:  Pt reports about 2 week history of sudden onset of Lt glute pain radiating into LLE.  She had injection which gave her about 2 days of relief but then pain returned.  Pt reports pain  was unbearable until yesterday but now it seems to have improved.  PERTINENT HISTORY:  HLD, hx seizures, Parkinson's Disease  PAIN:  Are you having pain? Yes: NPRS scale: 9/10 Pain location: Lt glute to ankle Pain description: sharp, stabbing Aggravating factors: reaching overhead; trying to stand up straight  Relieving factors: medication, standing in forward flexed posture  PRECAUTIONS:  Fall  RED FLAGS: None   WEIGHT BEARING RESTRICTIONS:  No  FALLS:  Has patient fallen in last 6 months? Yes. Number of falls 2; one this week - Dr Tat suggested use of walker  LIVING ENVIRONMENT: Lives with: lives with their spouse Lives in: House/apartment Stairs:  single step to enter Has following equipment at home: Dan Humphreys - 2 wheeled  OCCUPATION:  Retired   PLOF:  Independent and Leisure: Curator, shopping, go to church  PATIENT GOALS:  Stand and walk without pain   OBJECTIVE:   DIAGNOSTIC FINDINGS:  X-rays:  There is degenerative changes.  PATIENT SURVEYS:  04/08/23 FOTO 22 (predicted 49)  COGNITIVE STATUS: Within functional limits for tasks assessed   SENSATION: WFL  POSTURE:  rounded shoulders, forward head, and increased thoracic kyphosis  GAIT: 04/08/23 Comments: Pt independent with ambulation; has occasional episodes of shuffling and decreased initiation observed within clinic  PALPATION: 04/08/23: pain with trigger points in Lt glute and piriformis  LUMBAR ROM:   Active  A/PROM  eval  Flexion WNL (directional preference)  Extension Limited 20%  Right quadrant WNL  Left quadrant WNL   (Blank rows = not tested)    LOWER EXTREMITY MMT:    MMT Right eval Left eval  Hip flexion    Hip extension    Hip abduction 4/5 3/5  Hip adduction    Hip internal rotation    Hip external rotation    Knee flexion    Knee extension    Ankle dorsiflexion    Ankle plantarflexion    Ankle inversion    Ankle eversion     (Blank rows = not  tested)    SPECIAL TESTS:  04/08/23 Lumbar Slump test: Negative  TREATMENT:                                                                                                                              DATE:  04/13/23:  TherEx:  Seated hamstring stretch: x 3 on left LE holding 20 sec Seated SLR on Left: 2 x 10  Supine: trunk rotation: x 2 bil holding 30 sec Supine: single knee to chest: x 5 holding 10 sec Supine: double knee to chest: x 3 holding 10 sec Supine: bridge: x 10 holding 5 sec Hip adduction ball squeezes: 2 x 10 holding 5 sec Side-lying: hip abduction 2 x 10  Side-lying reverse clams: 2 x 10  Manual:  Trigger Point Dry-Needling  Treatment instructions: Expect mild to moderate muscle soreness. S/S of pneumothorax if dry needled over a lung field, and to seek immediate medical attention should they occur. Patient verbalized understanding of these instructions and education.  Patient Consent Given: Yes Education handout provided: Yes Muscles treated: Left glute med Electrical stimulation performed: No Parameters: N/A Treatment response/outcome: good response noted, twitch response elicited    04/08/23 See HEP - trial reps performed with min cues needed     PATIENT EDUCATION:  Education details: review HEP, DN education Person educated: Patient Education method:  Explanation, Demonstration, and Handouts Education comprehension: verbalized understanding, returned demonstration, and needs further education  HOME EXERCISE PROGRAM: Access Code: [email or web address] URL: [email or web address] Date: 04/08/2023 Prepared by: Moshe Cipro  Exercises - Supine Double Knee to Chest  - 2 x daily - 7 x weekly - 1 sets - 3 reps - 30 sec hold - Hooklying Single Knee to Chest  - 2 x daily - 7 x weekly - 1 sets - 3 reps - 30 sec hold - Supine Piriformis Stretch with Foot on Ground  - 2 x daily - 7 x weekly - 1 sets - 3 reps - 30 sec hold - Sidelying Hip Circles  - 2 x  daily - 7 x weekly - 2 sets - 10 circles each way - Standing Hip Extension  - 2 x daily - 7 x weekly - 2 sets - 10 reps - Self Release   - 2-3 x daily - 7 x weekly - 1 sets - 1 reps - 2-3 min hold   ASSESSMENT:  CLINICAL IMPRESSION: Pt reporting 9/10 pain upon arrival with standing and hip extension. Pt tolerated all exercises which included a review of pt's initial HEP.  Treatment focusing on LE/core strengthening. Pt wishing to try DN with good response. Recommend continued skilled PT to maximize pt's function.     OBJECTIVE IMPAIRMENTS: Abnormal gait, decreased balance, decreased mobility, difficulty walking, decreased ROM, decreased strength, increased fascial restrictions, increased muscle spasms, impaired flexibility, postural dysfunction, and pain.   ACTIVITY LIMITATIONS: carrying, lifting, bending, sitting, standing, transfers, bed mobility, and locomotion level  PARTICIPATION LIMITATIONS: meal prep, cleaning, laundry, shopping, and community activity  PERSONAL FACTORS: Age and 3+ comorbidities: HLD, hx seizures, Parkinson's Disease  are also affecting patient's functional outcome.   REHAB POTENTIAL: Good  CLINICAL DECISION MAKING: Evolving/moderate complexity  EVALUATION COMPLEXITY: Moderate   GOALS: Goals reviewed with patient? Yes  SHORT TERM GOALS: Target date: 04/29/2023  Independent with initial HEP Goal status: on-going 04/13/23   LONG TERM GOALS: Target date: 05/20/2023  Independent with final HEP Goal status: INITIAL  2.  FOTO score improved to 49 Goal status: INITIAL  3.  Lt hip abduction strength improved to 4/5 for improved function and mobility Goal status: INIITAL  4.  Report pain < 3/10 with standing and walking activities for improved function Goal status: INITIAL  5.  Demonstrate lumbar ROM without pain or limitation Goal status: INITIAL  PLAN:  PT FREQUENCY: 1-2x/week  PT DURATION: 6 weeks  PLANNED INTERVENTIONS: Therapeutic  exercises, Therapeutic activity, Neuromuscular re-education, Balance training, Gait training, Patient/Family education, Self Care, Joint mobilization, DME instructions, Aquatic Therapy, Dry Needling, Electrical stimulation, Spinal manipulation, Spinal mobilization, Cryotherapy, Moist heat, Taping, Traction, Manual therapy, and Re-evaluation.  PLAN FOR NEXT SESSION:  consider DN, needs hip/core strengthening Assess response to DN next visit.   NEXT MD VISIT: 04/12/23    Narda Amber, PT, MPT 04/13/23 1:20 PM   04/13/23 1:20 PM

## 2023-04-19 ENCOUNTER — Telehealth: Payer: Self-pay | Admitting: Orthopaedic Surgery

## 2023-04-19 NOTE — Telephone Encounter (Signed)
Patient called. Says she took the medrol wrong. Would like to know what to do. Her cb# is [phone]

## 2023-04-19 NOTE — Telephone Encounter (Signed)
Called patient

## 2023-04-21 ENCOUNTER — Encounter: Payer: Self-pay | Admitting: Physical Therapy

## 2023-04-21 ENCOUNTER — Ambulatory Visit: Payer: PPO | Admitting: Physical Therapy

## 2023-04-21 DIAGNOSIS — M79605 Pain in left leg: Secondary | ICD-10-CM

## 2023-04-21 DIAGNOSIS — R2689 Other abnormalities of gait and mobility: Secondary | ICD-10-CM | POA: Diagnosis not present

## 2023-04-21 DIAGNOSIS — M5459 Other low back pain: Secondary | ICD-10-CM | POA: Diagnosis not present

## 2023-04-21 DIAGNOSIS — M6281 Muscle weakness (generalized): Secondary | ICD-10-CM

## 2023-04-21 NOTE — Therapy (Addendum)
OUTPATIENT PHYSICAL THERAPY TREATMENT DISCHARGE SUMMARY  Patient Name: Chelsea Conley MRN: [id number] DOB:Oct 23, 1950, 72 y.o., female Today's Date: 04/21/2023  END OF SESSION:  PT End of Session - 04/21/23 1303     Visit Number 3    Number of Visits 12    Date for PT Re-Evaluation 05/20/23    Progress Note Due on Visit 10    PT Start Time 1300    PT Stop Time 1338    PT Time Calculation (min) 38 min    Activity Tolerance Patient tolerated treatment well    Behavior During Therapy WFL for tasks assessed/performed               Past Medical History:  Diagnosis Date   History of seizures    History of tremor    Parkinson disease (HCC) 05/15/2015   Past Surgical History:  Procedure Laterality Date   FOOT SURGERY Bilateral    MOUTH SURGERY     X2   TONSILLECTOMY     Patient Active Problem List   Diagnosis Date Noted   Arthritis of carpometacarpal [facility]) joint of right thumb 12/21/2018   HLD (hyperlipidemia) 05/15/2015   Seizure (HCC) 05/15/2015   Has a tremor 05/15/2015   Parkinson disease (HCC) 05/15/2015    PCP: Joycelyn Rua, MD  REFERRING PROVIDER: Kathryne Hitch*  REFERRING DIAG: Y60.630 (ICD-10-CM) - Pain in left leg  Rationale for Evaluation and Treatment: Rehabilitation  THERAPY DIAG:  Pain in left leg  Other low back pain  Muscle weakness (generalized)  Other abnormalities of gait and mobility  ONSET DATE: 03/24/23   SUBJECTIVE:                                                                                                                                                                                           SUBJECTIVE STATEMENT: 04/21/2023: DN helped for about a day; still having a lot of pain   Eval:  Pt reports about 2 week history of sudden onset of Lt glute pain radiating into LLE.  She had injection which gave her about 2 days of relief but then pain returned.  Pt reports pain was unbearable until yesterday but now it  seems to have improved.  PERTINENT HISTORY:  HLD, hx seizures, Parkinson's Disease  PAIN:  Are you having pain? Yes: NPRS scale: 9/10 Pain location: Lt glute to ankle Pain description: sharp, stabbing Aggravating factors: reaching overhead; trying to stand up straight  Relieving factors: medication, standing in forward flexed posture  PRECAUTIONS:  Fall  RED FLAGS: None   WEIGHT BEARING RESTRICTIONS:  No  FALLS:  Has patient fallen in  last 6 months? Yes. Number of falls 2; one this week - Dr Tat suggested use of walker  LIVING ENVIRONMENT: Lives with: lives with their spouse Lives in: House/apartment Stairs:  single step to enter Has following equipment at home: Dan Humphreys - 2 wheeled  OCCUPATION:  Retired   PLOF:  Independent and Leisure: Curator, shopping, go to church  PATIENT GOALS:  Stand and walk without pain   OBJECTIVE:   DIAGNOSTIC FINDINGS:  X-rays:  There is degenerative changes.  PATIENT SURVEYS:  04/08/23 FOTO 22 (predicted 49)  COGNITIVE STATUS: Within functional limits for tasks assessed   SENSATION: WFL  POSTURE:  rounded shoulders, forward head, and increased thoracic kyphosis  GAIT: 04/08/23 Comments: Pt independent with ambulation; has occasional episodes of shuffling and decreased initiation observed within clinic  PALPATION: 04/08/23: pain with trigger points in Lt glute and piriformis  LUMBAR ROM:   Active  A/PROM  eval  Flexion WNL (directional preference)  Extension Limited 20%  Right quadrant WNL  Left quadrant WNL   (Blank rows = not tested)    LOWER EXTREMITY MMT:    MMT Right eval Left eval  Hip flexion    Hip extension    Hip abduction 4/5 3/5  Hip adduction    Hip internal rotation    Hip external rotation    Knee flexion    Knee extension    Ankle dorsiflexion    Ankle plantarflexion    Ankle inversion    Ankle eversion     (Blank rows = not tested)    SPECIAL TESTS:  04/08/23 Lumbar  Slump test: Negative  TREATMENT:                                                                                                                              DATE:  04/21/23 Manual:  STM with compression to Lt glute max and piriformis; skilled palpation and monitoring of soft tissue during DN Trigger Point Dry-Needling  Treatment instructions: Expect mild to moderate muscle soreness. S/S of pneumothorax if dry needled over a lung field, and to seek immediate medical attention should they occur. Patient verbalized understanding of these instructions and education.  Patient Consent Given: Yes Education handout provided: Yes Muscles treated: Left glute med, piriformis  Electrical stimulation performed: No Parameters: N/A Treatment response/outcome: good response noted, twitch response elicited  TherEx:   Supine piriformis stretch 3x30 sec on Lt Single knee to chest 3x30 sec on Lt Hooklying single limb clamshell 2x10 bil; L4 band Trial of standing Lt lateral shift correction - unable to tolerate Prone on elbows x 2 min - no change in symptoms  Self-Care Discussed current POC and possibly waiting until after seeing MD for MRI results as well as possible treatment options in future.  Recommended she continue exercises as pain allows.  04/13/23:  TherEx:  Seated hamstring stretch: x 3 on left LE holding 20 sec Seated SLR on Left: 2 x  10  Supine: trunk rotation: x 2 bil holding 30 sec Supine: single knee to chest: x 5 holding 10 sec Supine: double knee to chest: x 3 holding 10 sec Supine: bridge: x 10 holding 5 sec Hip adduction ball squeezes: 2 x 10 holding 5 sec Side-lying: hip abduction 2 x 10  Side-lying reverse clams: 2 x 10  Manual:  Trigger Point Dry-Needling  Treatment instructions: Expect mild to moderate muscle soreness. S/S of pneumothorax if dry needled over a lung field, and to seek immediate medical attention should they occur. Patient verbalized understanding of these  instructions and education.  Patient Consent Given: Yes Education handout provided: Yes Muscles treated: Left glute med Electrical stimulation performed: No Parameters: N/A Treatment response/outcome: good response noted, twitch response elicited    04/08/23 See HEP - trial reps performed with min cues needed     PATIENT EDUCATION:  Education details: review HEP, DN education Person educated: Patient Education method: Explanation, Demonstration, and Handouts Education comprehension: verbalized understanding, returned demonstration, and needs further education  HOME EXERCISE PROGRAM: Access Code: [email or web address] URL: [email or web address] Date: 04/08/2023 Prepared by: Moshe Cipro  Exercises - Supine Double Knee to Chest  - 2 x daily - 7 x weekly - 1 sets - 3 reps - 30 sec hold - Hooklying Single Knee to Chest  - 2 x daily - 7 x weekly - 1 sets - 3 reps - 30 sec hold - Supine Piriformis Stretch with Foot on Ground  - 2 x daily - 7 x weekly - 1 sets - 3 reps - 30 sec hold - Sidelying Hip Circles  - 2 x daily - 7 x weekly - 2 sets - 10 circles each way - Standing Hip Extension  - 2 x daily - 7 x weekly - 2 sets - 10 reps - Self Release   - 2-3 x daily - 7 x weekly - 1 sets - 1 reps - 2-3 min hold   ASSESSMENT:  CLINICAL IMPRESSION: Pt with some relief from DN for about 2 hours, but then pain returned.  She's still having high levels of pain impacting participation with PT.  She gets MRI tomorrow so will see results of that.  Recommend continued skilled PT to maximize pt's function.     OBJECTIVE IMPAIRMENTS: Abnormal gait, decreased balance, decreased mobility, difficulty walking, decreased ROM, decreased strength, increased fascial restrictions, increased muscle spasms, impaired flexibility, postural dysfunction, and pain.   ACTIVITY LIMITATIONS: carrying, lifting, bending, sitting, standing, transfers, bed mobility, and locomotion level  PARTICIPATION  LIMITATIONS: meal prep, cleaning, laundry, shopping, and community activity  PERSONAL FACTORS: Age and 3+ comorbidities: HLD, hx seizures, Parkinson's Disease  are also affecting patient's functional outcome.   REHAB POTENTIAL: Good  CLINICAL DECISION MAKING: Evolving/moderate complexity  EVALUATION COMPLEXITY: Moderate   GOALS: Goals reviewed with patient? Yes  SHORT TERM GOALS: Target date: 04/29/2023  Independent with initial HEP Goal status: on-going 04/13/23   LONG TERM GOALS: Target date: 05/20/2023  Independent with final HEP Goal status: INITIAL  2.  FOTO score improved to 49 Goal status: INITIAL  3.  Lt hip abduction strength improved to 4/5 for improved function and mobility Goal status: INIITAL  4.  Report pain < 3/10 with standing and walking activities for improved function Goal status: INITIAL  5.  Demonstrate lumbar ROM without pain or limitation Goal status: INITIAL  PLAN:  PT FREQUENCY: 1-2x/week  PT DURATION: 6 weeks  PLANNED INTERVENTIONS: Therapeutic exercises, Therapeutic activity, Neuromuscular re-education, Balance training,  Gait training, Patient/Family education, Self Care, Joint mobilization, DME instructions, Aquatic Therapy, Dry Needling, Electrical stimulation, Spinal manipulation, Spinal mobilization, Cryotherapy, Moist heat, Taping, Traction, Manual therapy, and Re-evaluation.  PLAN FOR NEXT SESSION:  needs hip/core strengthening Assess response to DN next visit.   NEXT MD VISIT: 04/29/23      Clarita Crane, PT, DPT 04/21/23 1:41 PM      PHYSICAL THERAPY DISCHARGE SUMMARY  Visits from Start of Care: 3  Current functional level related to goals / functional outcomes: See above   Remaining deficits: See above   Education / Equipment: HEP   Patient agrees to discharge. Patient goals were not met. Patient is being discharged due to not returning since the last visit.  Clarita Crane, PT, DPT 05/31/23  2:29 PM  [facility] [address] [place], [place], [address] Phone: [phone]   Fax:  [phone]

## 2023-04-22 ENCOUNTER — Ambulatory Visit
Admission: RE | Admit: 2023-04-22 | Discharge: 2023-04-22 | Disposition: A | Discharge status: Home / Self Care | Payer: PPO | Source: Ambulatory Visit | Attending: Orthopaedic Surgery | Admitting: Orthopaedic Surgery

## 2023-04-22 DIAGNOSIS — M4316 Spondylolisthesis, lumbar region: Secondary | ICD-10-CM | POA: Diagnosis not present

## 2023-04-22 DIAGNOSIS — M5442 Lumbago with sciatica, left side: Secondary | ICD-10-CM | POA: Diagnosis not present

## 2023-04-22 DIAGNOSIS — M47816 Spondylosis without myelopathy or radiculopathy, lumbar region: Secondary | ICD-10-CM | POA: Diagnosis not present

## 2023-04-28 ENCOUNTER — Encounter: Payer: PPO | Admitting: Physical Therapy

## 2023-04-29 ENCOUNTER — Ambulatory Visit: Payer: PPO | Admitting: Orthopaedic Surgery

## 2023-04-29 DIAGNOSIS — M48062 Spinal stenosis, lumbar region with neurogenic claudication: Secondary | ICD-10-CM

## 2023-04-29 DIAGNOSIS — M5442 Lumbago with sciatica, left side: Secondary | ICD-10-CM | POA: Diagnosis not present

## 2023-04-29 NOTE — Progress Notes (Signed)
The patient is a 72 year old with Parkinson disease who is coming in to go over MRI of her lumbar spine.  When I saw her recently she was having significant left-sided low back pain with radicular symptoms going down her leg.  She was sent to the regimen for her hip but her hip was normal.  She states she is actually feeling a little bit better now.  She denies any weakness.  She does have a tremor from her Parkinson's disease.  On exam isolating all the muscle groups in her right lower extremity and left lower extremity shows normal motor function.  She has negative straight leg raise on the left and right side.  NuPrep the MRI of her lumbar spine does show severe spinal stenosis at L4-L5 from facet arthritis combined with a grade 1 of these this and a left-sided synovial cyst.  She also let me know she is having some chronic neck issues.  At this point I would like to send her to one of my neurosurgery colleagues Dr. Thom Chimes for his assessment of her lumbar spine given the severe stenosis she has on the left side.  Her and her husband would like to have this referral as well.

## 2023-04-30 ENCOUNTER — Other Ambulatory Visit: Payer: Self-pay

## 2023-04-30 DIAGNOSIS — M5442 Lumbago with sciatica, left side: Secondary | ICD-10-CM

## 2023-04-30 DIAGNOSIS — M48062 Spinal stenosis, lumbar region with neurogenic claudication: Secondary | ICD-10-CM

## 2023-05-05 ENCOUNTER — Encounter: Payer: PPO | Admitting: Physical Therapy

## 2023-05-12 ENCOUNTER — Encounter: Payer: PPO | Admitting: Physical Therapy

## 2023-05-20 DIAGNOSIS — M542 Cervicalgia: Secondary | ICD-10-CM | POA: Diagnosis not present

## 2023-05-20 DIAGNOSIS — M7130 Other bursal cyst, unspecified site: Secondary | ICD-10-CM | POA: Diagnosis not present

## 2023-05-20 DIAGNOSIS — M5440 Lumbago with sciatica, unspecified side: Secondary | ICD-10-CM | POA: Diagnosis not present

## 2023-05-20 DIAGNOSIS — M48061 Spinal stenosis, lumbar region without neurogenic claudication: Secondary | ICD-10-CM | POA: Diagnosis not present

## 2023-05-28 NOTE — Therapy (Signed)
OUTPATIENT PHYSICAL THERAPY THORACOLUMBAR AND CERVICAL EVALUATION   Patient Name: Chelsea Conley MRN: [id number] DOB:17-Jan-1951, 72 y.o., female Today's Date: 05/31/2023  END OF SESSION:  PT End of Session - 05/31/23 0846     Visit Number 1    Number of Visits 12    Date for PT Re-Evaluation 07/26/23    Authorization Type HTA $15 copay    Progress Note Due on Visit 10    PT Start Time 0847    PT Stop Time 0927    PT Time Calculation (min) 40 min    Activity Tolerance Patient tolerated treatment well    Behavior During Therapy [facility] for tasks assessed/performed             Past Medical History:  Diagnosis Date   History of seizures    History of tremor    Parkinson disease (HCC) 05/15/2015   Past Surgical History:  Procedure Laterality Date   FOOT SURGERY Bilateral    MOUTH SURGERY     X2   TONSILLECTOMY     Patient Active Problem List   Diagnosis Date Noted   Arthritis of carpometacarpal [facility]) joint of right thumb 12/21/2018   HLD (hyperlipidemia) 05/15/2015   Seizure (HCC) 05/15/2015   Has a tremor 05/15/2015   Parkinson disease (HCC) 05/15/2015    PCP: Joycelyn Rua, MD   REFERRING PROVIDER: Donalee Citrin, MD   REFERRING DIAG:  [phone] (ICD-10-CM) - Spinal stenosis, lumbar region without neurogenic claudication  M54.2 (ICD-10-CM) - Cervicalgia    Rationale for Evaluation and Treatment: Rehabilitation  THERAPY DIAG:  Cervicalgia - Plan: PT plan of care cert/re-cert  Abnormal posture - Plan: PT plan of care cert/re-cert  Other low back pain - Plan: PT plan of care cert/re-cert  ONSET DATE: August 2024  SUBJECTIVE:                                                                                                                                                                                           SUBJECTIVE STATEMENT: Had cortisone shot with Dr. Magnus Ivan and PT with mild relief from DN. Now no pain. The pain originated doing the PWR! Moves when  having PT for Parkinson's. She hasn't been back to [facility]. Pain was originally L hip to ankle. Now she may feel it if she has been on her feet all day. She has pain in the neck with turning mainly to the L, but sometimes to the R. Tightness with neck extension.  PERTINENT HISTORY:  Spinal stenosis, and has a benign cyst at the bottom of her spine, seizures (2 years ago), PD  PAIN:  Are  you having pain? Yes: NPRS scale: 0  up to 5 with turning/10 Pain location: back of neck up into the back of her head Pain description: ache and tight Aggravating factors: turning Relieving factors: heat or biofreeze    PRECAUTIONS: Fall  RED FLAGS: None   WEIGHT BEARING RESTRICTIONS: No  FALLS:  Has patient fallen in last 6 months? Yes. Number of falls 2 when she was having leg pain  LIVING ENVIRONMENT: Lives with: lives with their spouse Lives in: House/apartment Stairs: No Has following equipment at home: Single point cane and Environmental consultant - 2 wheeled  OCCUPATION: retired  PLOF: Independent  PATIENT GOALS: be flexible, reduce neck pain   NEXT MD VISIT: 06/15/23  OBJECTIVE:  Note: Objective measures were completed at Evaluation unless otherwise noted.  DIAGNOSTIC FINDINGS:  MRI IMPRESSION: 1. Severe L4-5 facet arthrosis with grade 1 anterolisthesis and a left-sided synovial cyst contributing to severe spinal stenosis. 2. Mild lateral recess and neural foraminal stenosis at L3-4.  PATIENT SURVEYS:  NDI 13 / 50 = 26.0 %  COGNITION: Overall cognitive status: Within functional limits for tasks assessed     SENSATION: WFL  MUSCLE LENGTH: Marked R HS, left HS and B piriformis mod  POSTURE: rounded shoulders, forward head, and increased thoracic kyphosis  PALPATION: Unremarkable, stiff thoracic spine, cervical spine normal mobility  CERVICAL ROM: *pain  Active ROM A/PROM (deg) eval  Flexion full  Extension 27* B  Right lateral flexion 15* left  Left lateral flexion 25   Right rotation 48*  Left rotation 48*   (Blank rows = not tested)   LUMBAR ROM: WNL except below. Unsteady with extension. *pain  AROM eval  Flexion   Extension   Right lateral flexion   Left lateral flexion   Right rotation WNL * neck  Left rotation 50% * neck   (Blank rows = not tested)  LOWER EXTREMITY ROM:   WNL   LOWER EXTREMITY MMT:  5/5 except hip ABD/ADD 4+/5; some core weakness L noted with MMT of R hip flexors.    FUNCTIONAL TESTS:  5 times sit to stand: 15.12 sec  GAIT: Distance walked: 20 Assistive device utilized: None Level of assistance: Complete Independence Comments: parkinsonian gait with decreased heel strike, forward trunk lean  TODAY'S TREATMENT:                                                                                                                              DATE:   05/31/23 See pt ed and HEP   PATIENT EDUCATION:  Education details: PT eval findings, anticipated POC, initial HEP, and postural awareness  Person educated: Patient Education method: Explanation, Demonstration, and Handouts Education comprehension: verbalized understanding and returned demonstration  HOME EXERCISE PROGRAM: Access Code: [email or web address] URL: [email or web address] Date: 05/31/2023 Prepared by: Raynelle Fanning  Exercises - Seated Table Hamstring Stretch  - 2 x daily - 7 x weekly - 1 sets - 3 reps - 30-60 sec hold -  Seated Piriformis Stretch with Trunk Bend  - 2 x daily - 7 x weekly - 1 sets - 3 reps - 30-60 sec  hold - Seated Cervical Retraction  - 2 x daily - 7 x weekly - 1 sets - 10 reps - 5 hold - Seated Scapular Retraction  - 1 x daily - 7 x weekly - 1-3 sets - 10 reps - 2-3 sec hold - Sit to Stand with Arms Crossed  - 2-3 x daily - 3-4 x weekly - 1 sets - 10 reps - Seated Cervical Sidebending AROM  - 2 x daily - 7 x weekly - 1 sets - 5 reps - 10 hold  ASSESSMENT:  CLINICAL IMPRESSION: Patient is a 72 y.o. female who was seen today for physical  therapy evaluation and treatment for neck and lumbar stenosis. She reports that her main complaint is neck pain as her L leg pain that she was recently treated for at [facility] has resolved. She has significant postural deficits, decreased neck ROM  and pain with neck rotation and extension. She has marked HS tightness and unsteadiness with lumbar extension. She would like to get back to her [facility] class, but is reluctant as this is what caused her radicular pain previously. She will benefit from skilled PT to address these deficits.    OBJECTIVE IMPAIRMENTS: Abnormal gait, decreased balance, decreased ROM, decreased strength, increased muscle spasms, impaired flexibility, postural dysfunction, and pain.   ACTIVITY LIMITATIONS: lifting and [employer] classes  PARTICIPATION LIMITATIONS: driving and community activity  PERSONAL FACTORS: 1-2 comorbidities: spinal stenosis and PD  are also affecting patient's functional outcome.   REHAB POTENTIAL: Good  CLINICAL DECISION MAKING: Stable/uncomplicated  EVALUATION COMPLEXITY: Low   GOALS: Goals reviewed with patient? Yes  SHORT TERM GOALS: Target date: 06/28/2023   Patient will be independent with initial HEP for her neck and back.  Baseline:  Goal status: INITIAL 2.  Decreased neck pain with driving by  40% Baseline:  Goal status: INITIAL   LONG TERM GOALS: Target date: 07/26/23   Patient will be independent with advanced/ongoing HEP to improve outcomes and carryover.  Baseline:  Goal status: INITIAL  2.  Patient will report 75% improvement in neck pain to improve QOL.  Baseline:  Goal status: INITIAL  3.  Patient will demonstrate full pain free cervical ROM for safety with driving.  Baseline:  Goal status:INITIAL  4.  Patient will report 5 on NDI to demonstrate improved functional ability.  Baseline: 13/50 Goal status: INITIAL  5.  Patient will be able to return to [facility] class with no increase in back pain.    Baseline:  Goal status: INITIAL   6.  Patient to demonstrate 5xSTS by 2-3 seconds showing functional and LE strength improvements.   Baseline: 15.12 sec Goal status: INITIAL    PLAN:  PT FREQUENCY: 2x/week  PT DURATION:  12 sessions  PLANNED INTERVENTIONS: 97110-Therapeutic exercises, 97530- Therapeutic activity, [id number]- Neuromuscular re-education, 97535- Self Care, 98119- Manual therapy, [id number]- Aquatic Therapy, 97035- Ultrasound, [id number]- Traction (mechanical), Patient/Family education, Balance training, Taping, Dry Needling, Joint mobilization, Spinal mobilization, Cryotherapy, and Moist heat.  PLAN FOR NEXT SESSION: work on posture to improve neck mobility and pain, DN/MT prn, LE flexibility; work on easing patient back into [employer] exercises without increased back pain.    Solon Palm, PT  05/31/2023, 2:23 PM

## 2023-05-31 ENCOUNTER — Encounter: Payer: Self-pay | Admitting: Physical Therapy

## 2023-05-31 ENCOUNTER — Other Ambulatory Visit: Payer: Self-pay

## 2023-05-31 ENCOUNTER — Ambulatory Visit: Payer: PPO | Attending: Neurosurgery | Admitting: Physical Therapy

## 2023-05-31 DIAGNOSIS — M542 Cervicalgia: Secondary | ICD-10-CM | POA: Diagnosis not present

## 2023-05-31 DIAGNOSIS — M48061 Spinal stenosis, lumbar region without neurogenic claudication: Secondary | ICD-10-CM | POA: Diagnosis not present

## 2023-05-31 DIAGNOSIS — R293 Abnormal posture: Secondary | ICD-10-CM

## 2023-05-31 DIAGNOSIS — M5459 Other low back pain: Secondary | ICD-10-CM

## 2023-06-03 ENCOUNTER — Other Ambulatory Visit: Payer: Self-pay | Admitting: Neurology

## 2023-06-03 DIAGNOSIS — G20B1 Parkinson's disease with dyskinesia, without mention of fluctuations: Secondary | ICD-10-CM

## 2023-06-03 DIAGNOSIS — G20A1 Parkinson's disease without dyskinesia, without mention of fluctuations: Secondary | ICD-10-CM

## 2023-06-09 ENCOUNTER — Ambulatory Visit: Payer: PPO | Admitting: Rehabilitative and Restorative Service Providers"

## 2023-06-09 ENCOUNTER — Encounter: Payer: Self-pay | Admitting: Rehabilitative and Restorative Service Providers"

## 2023-06-09 DIAGNOSIS — M5459 Other low back pain: Secondary | ICD-10-CM

## 2023-06-09 DIAGNOSIS — R293 Abnormal posture: Secondary | ICD-10-CM

## 2023-06-09 DIAGNOSIS — M542 Cervicalgia: Secondary | ICD-10-CM

## 2023-06-09 DIAGNOSIS — M48061 Spinal stenosis, lumbar region without neurogenic claudication: Secondary | ICD-10-CM | POA: Diagnosis not present

## 2023-06-09 NOTE — Therapy (Signed)
OUTPATIENT PHYSICAL THERAPY TREATMENT NOTE   Patient Name: Chelsea Conley MRN: [id number] DOB:1950/09/10, 72 y.o., female Today's Date: 06/09/2023  END OF SESSION:  PT End of Session - 06/09/23 1101     Visit Number 2    Date for PT Re-Evaluation 07/26/23    Authorization Type HTA $15 copay    Progress Note Due on Visit 10    PT Start Time 1103    PT Stop Time 1145    PT Time Calculation (min) 42 min    Activity Tolerance Patient tolerated treatment well    Behavior During Therapy WFL for tasks assessed/performed             Past Medical History:  Diagnosis Date   History of seizures    History of tremor    Parkinson disease (HCC) 05/15/2015   Past Surgical History:  Procedure Laterality Date   FOOT SURGERY Bilateral    MOUTH SURGERY     X2   TONSILLECTOMY     Patient Active Problem List   Diagnosis Date Noted   Arthritis of carpometacarpal [facility]) joint of right thumb 12/21/2018   HLD (hyperlipidemia) 05/15/2015   Seizure (HCC) 05/15/2015   Has a tremor 05/15/2015   Parkinson disease (HCC) 05/15/2015    PCP: Joycelyn Rua, MD   REFERRING PROVIDER: Donalee Citrin, MD   REFERRING DIAG:  [phone] (ICD-10-CM) - Spinal stenosis, lumbar region without neurogenic claudication  M54.2 (ICD-10-CM) - Cervicalgia    Rationale for Evaluation and Treatment: Rehabilitation  THERAPY DIAG:  Cervicalgia  Abnormal posture  Other low back pain  ONSET DATE: August 2024  SUBJECTIVE:                                                                                                                                                                                           SUBJECTIVE STATEMENT: Pt reports that she is doing her HEP some but "probably not as much as I should."  PERTINENT HISTORY:  Spinal stenosis, and has a benign cyst at the bottom of her spine, seizures (2 years ago), PD  PAIN:  Are you having pain? Yes: NPRS scale: 7/10 Pain location: back of neck up  into the back of her head Pain description: ache and tight Aggravating factors: turning Relieving factors: heat or biofreeze    PRECAUTIONS: Fall  RED FLAGS: None   WEIGHT BEARING RESTRICTIONS: No  FALLS:  Has patient fallen in last 6 months? Yes. Number of falls 2 when she was having leg pain  LIVING ENVIRONMENT: Lives with: lives with their spouse Lives in: House/apartment Stairs: No Has following equipment at home: Single point cane  and Walker - 2 wheeled  OCCUPATION: retired  PLOF: Independent  PATIENT GOALS: be flexible, reduce neck pain   NEXT MD VISIT: 06/15/23  OBJECTIVE:  Note: Objective measures were completed at Evaluation unless otherwise noted.  DIAGNOSTIC FINDINGS:  MRI IMPRESSION: 1. Severe L4-5 facet arthrosis with grade 1 anterolisthesis and a left-sided synovial cyst contributing to severe spinal stenosis. 2. Mild lateral recess and neural foraminal stenosis at L3-4.  PATIENT SURVEYS:  Eval:  NDI 13 / 50 = 26.0 %  COGNITION: Overall cognitive status: Within functional limits for tasks assessed     SENSATION: WFL  MUSCLE LENGTH: Marked R HS, left HS and B piriformis mod  POSTURE: rounded shoulders, forward head, and increased thoracic kyphosis  PALPATION: Unremarkable, stiff thoracic spine, cervical spine normal mobility  CERVICAL ROM: *pain  Active ROM A/PROM (deg) eval  Flexion full  Extension 27* B  Right lateral flexion 15* left  Left lateral flexion 25  Right rotation 48*  Left rotation 48*   (Blank rows = not tested)   LUMBAR ROM: WNL except below. Unsteady with extension. *pain  AROM eval  Flexion   Extension   Right lateral flexion   Left lateral flexion   Right rotation WNL * neck  Left rotation 50% * neck   (Blank rows = not tested)  LOWER EXTREMITY ROM:   WNL   LOWER EXTREMITY MMT:  5/5 except hip ABD/ADD 4+/5; some core weakness L noted with MMT of R hip flexors.    FUNCTIONAL TESTS:  Eval: 5 times sit  to stand: 15.12 sec  06/09/2023: 3 minute walk test:  471 ft  GAIT: Distance walked: 20 Assistive device utilized: None Level of assistance: Complete Independence Comments: parkinsonian gait with decreased heel strike, forward trunk lean  TODAY'S TREATMENT:                                                                                                                              DATE:   06/09/2023 Nustep level 2 x5 min with PT present to discuss status Seated cervical SNAGs for rotation x10 bilat Seated cervical SNAGs for extension x10 Seated forward punches with 1# 2x10 bilat Seated shoulder flexion with 1# 2x10 bilat Seated overhead press with 1# 2x10 bilat Seated 3 way green pball rollout 5x5 sec each Seated hamstring stretch 2x20 sec bilat Seated piriformis stretch 2x20 sec bilat Sit to/from stand holding 1# dumbbell in each hand x10 Standing rows with red tband 2x10 Standing shoulder ER with red tband 2x10 3 minute walk test:  471 ft   05/31/23 See pt ed and HEP   PATIENT EDUCATION:  Education details: PT eval findings, anticipated POC, initial HEP, and postural awareness  Person educated: Patient Education method: Explanation, Demonstration, and Handouts Education comprehension: verbalized understanding and returned demonstration  HOME EXERCISE PROGRAM: Access Code: [email or web address] URL: [email or web address] Date: 05/31/2023 Prepared by: Raynelle Fanning  Exercises - Seated Table Hamstring Stretch  - 2 x daily -  7 x weekly - 1 sets - 3 reps - 30-60 sec hold - Seated Piriformis Stretch with Trunk Bend  - 2 x daily - 7 x weekly - 1 sets - 3 reps - 30-60 sec  hold - Seated Cervical Retraction  - 2 x daily - 7 x weekly - 1 sets - 10 reps - 5 hold - Seated Scapular Retraction  - 1 x daily - 7 x weekly - 1-3 sets - 10 reps - 2-3 sec hold - Sit to Stand with Arms Crossed  - 2-3 x daily - 3-4 x weekly - 1 sets - 10 reps - Seated Cervical Sidebending AROM  - 2 x daily - 7  x weekly - 1 sets - 5 reps - 10 hold  ASSESSMENT:  CLINICAL IMPRESSION: Ms Chadderdon presents to skilled PT reporting that she has been doing her exercises at home.  Patient able to progress with session, requiring occasional cuing for exercises for improved posture and body mechanics.  Patient able to participate in a 3 minute walk test during session with only minimal freezing episodes noted, primarily during a turn.  Patient continues to skilled PT to further progress towards goal related activities.  OBJECTIVE IMPAIRMENTS: Abnormal gait, decreased balance, decreased ROM, decreased strength, increased muscle spasms, impaired flexibility, postural dysfunction, and pain.   ACTIVITY LIMITATIONS: lifting and [employer] classes  PARTICIPATION LIMITATIONS: driving and community activity  PERSONAL FACTORS: 1-2 comorbidities: spinal stenosis and PD  are also affecting patient's functional outcome.   REHAB POTENTIAL: Good  CLINICAL DECISION MAKING: Stable/uncomplicated  EVALUATION COMPLEXITY: Low   GOALS: Goals reviewed with patient? Yes  SHORT TERM GOALS: Target date: 06/28/2023   Patient will be independent with initial HEP for her neck and back.  Baseline:  Goal status: Ongoing  2.  Decreased neck pain with driving by  86% Baseline:  Goal status: INITIAL   LONG TERM GOALS: Target date: 07/26/23   Patient will be independent with advanced/ongoing HEP to improve outcomes and carryover.  Baseline:  Goal status: INITIAL  2.  Patient will report 75% improvement in neck pain to improve QOL.  Baseline:  Goal status: INITIAL  3.  Patient will demonstrate full pain free cervical ROM for safety with driving.  Baseline:  Goal status:INITIAL  4.  Patient will report 5 on NDI to demonstrate improved functional ability.  Baseline: 13/50 Goal status: INITIAL  5.  Patient will be able to return to [facility] class with no increase in back pain.   Baseline:  Goal status: INITIAL    6.  Patient to demonstrate 5xSTS by 2-3 seconds showing functional and LE strength improvements.   Baseline: 15.12 sec Goal status: INITIAL    PLAN:  PT FREQUENCY: 2x/week  PT DURATION:  12 sessions  PLANNED INTERVENTIONS: 97110-Therapeutic exercises, 97530- Therapeutic activity, [id number]- Neuromuscular re-education, 97535- Self Care, [id number]- Manual therapy, [id number]- Aquatic Therapy, 97035- Ultrasound, [id number]- Traction (mechanical), Patient/Family education, Balance training, Taping, Dry Needling, Joint mobilization, Spinal mobilization, Cryotherapy, and Moist heat.  PLAN FOR NEXT SESSION: work on posture to improve neck mobility and pain, DN/MT prn, LE flexibility; work on easing patient back into [employer] exercises without increased back pain.     Reather Laurence, PT, DPT 06/09/23, 11:09 AM  [facility] [address] [place], [place] [address] Phone # [phone] Fax [phone]

## 2023-06-15 ENCOUNTER — Telehealth: Payer: Self-pay | Admitting: Neurology

## 2023-06-15 DIAGNOSIS — M48061 Spinal stenosis, lumbar region without neurogenic claudication: Secondary | ICD-10-CM | POA: Diagnosis not present

## 2023-06-15 MED ORDER — RYTARY 61.25-245 MG PO CPCR: 245.0000 mg | ORAL_CAPSULE | Freq: Three times a day (TID) | ORAL | 1 refills | Status: DC

## 2023-06-15 NOTE — Telephone Encounter (Signed)
Pt calling asking if she can get more samples of rytary 245 mg?  She is on her way to another dr appointment she will be on this side of town if she can come by and pick them up today

## 2023-06-15 NOTE — Telephone Encounter (Signed)
Rx sent in for pt.

## 2023-06-15 NOTE — Telephone Encounter (Signed)
Patient has some questions about the medication Rytary  please call

## 2023-06-15 NOTE — Telephone Encounter (Signed)
Pt's daughter came in inquiring about the samples. I let her know we didn't have any. She double checked with the pt and the pt does need a prescription to be sent to [facility] in [facility]

## 2023-06-16 NOTE — Therapy (Addendum)
 OUTPATIENT PHYSICAL THERAPY TREATMENT NOTE AND DISCHARGE SUMMARY   Patient Name: Chelsea Conley MRN: [id number] DOB:1950-10-25, 72 y.o., female Today's Date: 06/17/2023  END OF SESSION:  PT End of Session - 06/17/23 1452     Visit Number 3    Number of Visits 12    Date for PT Re-Evaluation 07/26/23    Authorization Type HTA $15 copay    Progress Note Due on Visit 10    PT Start Time 1450    PT Stop Time 1530    PT Time Calculation (min) 40 min    Activity Tolerance Patient tolerated treatment well    Behavior During Therapy WFL for tasks assessed/performed              Past Medical History:  Diagnosis Date   History of seizures    History of tremor    Parkinson disease (HCC) 05/15/2015   Past Surgical History:  Procedure Laterality Date   FOOT SURGERY Bilateral    MOUTH SURGERY     X2   TONSILLECTOMY     Patient Active Problem List   Diagnosis Date Noted   Arthritis of carpometacarpal [facility]) joint of right thumb 12/21/2018   HLD (hyperlipidemia) 05/15/2015   Seizure (HCC) 05/15/2015   Has a tremor 05/15/2015   Parkinson disease (HCC) 05/15/2015    PCP: Joycelyn Rua, MD   REFERRING PROVIDER: Donalee Citrin, MD   REFERRING DIAG:  [phone] (ICD-10-CM) - Spinal stenosis, lumbar region without neurogenic claudication  M54.2 (ICD-10-CM) - Cervicalgia    Rationale for Evaluation and Treatment: Rehabilitation  THERAPY DIAG:  Cervicalgia  Abnormal posture  Other low back pain  Pain in left leg  Muscle weakness (generalized)  ONSET DATE: August 2024  SUBJECTIVE:                                                                                                                                                                                           SUBJECTIVE STATEMENT: Feeling pretty good today. It hurts when I turn my neck.  PERTINENT HISTORY:  Spinal stenosis, and has a benign cyst at the bottom of her spine, seizures (2 years ago), PD  PAIN:   Are you having pain? Yes: NPRS scale: 2/10 Pain location: back of neck up into the back of her head Pain description: ache and tight Aggravating factors: turning Relieving factors: heat or biofreeze    PRECAUTIONS: Fall  RED FLAGS: None   WEIGHT BEARING RESTRICTIONS: No  FALLS:  Has patient fallen in last 6 months? Yes. Number of falls 2 when she was having leg pain  LIVING ENVIRONMENT: Lives with: lives with their  spouse Lives in: House/apartment Stairs: No Has following equipment at home: Single point cane and Environmental consultant - 2 wheeled  OCCUPATION: retired  PLOF: Independent  PATIENT GOALS: be flexible, reduce neck pain   NEXT MD VISIT: 06/15/23  OBJECTIVE:  Note: Objective measures were completed at Evaluation unless otherwise noted.  DIAGNOSTIC FINDINGS:  MRI IMPRESSION: 1. Severe L4-5 facet arthrosis with grade 1 anterolisthesis and a left-sided synovial cyst contributing to severe spinal stenosis. 2. Mild lateral recess and neural foraminal stenosis at L3-4.  PATIENT SURVEYS:  Eval:  NDI 13 / 50 = 26.0 %  COGNITION: Overall cognitive status: Within functional limits for tasks assessed     SENSATION: WFL  MUSCLE LENGTH: Marked R HS, left HS and B piriformis mod  POSTURE: rounded shoulders, forward head, and increased thoracic kyphosis  PALPATION: Unremarkable, stiff thoracic spine, cervical spine normal mobility  CERVICAL ROM: *pain  Active ROM A/PROM (deg) eval  Flexion full  Extension 27* B  Right lateral flexion 15* left  Left lateral flexion 25  Right rotation 48*  Left rotation 48*   (Blank rows = not tested)   LUMBAR ROM: WNL except below. Unsteady with extension. *pain  AROM eval  Flexion   Extension   Right lateral flexion   Left lateral flexion   Right rotation WNL * neck  Left rotation 50% * neck   (Blank rows = not tested)  LOWER EXTREMITY ROM:   WNL   LOWER EXTREMITY MMT:  5/5 except hip ABD/ADD 4+/5; some core weakness L  noted with MMT of R hip flexors.    FUNCTIONAL TESTS:  Eval: 5 times sit to stand: 15.12 sec  06/09/2023: 3 minute walk test:  471 ft  GAIT: Distance walked: 20 Assistive device utilized: None Level of assistance: Complete Independence Comments: parkinsonian gait with decreased heel strike, forward trunk lean  TODAY'S TREATMENT:                                                                                                                              DATE:   06/17/2023 Nustep level 5 x5 min with PT present to discuss status Standing rows with red tband 2x10 Standing shoulder ER with red tband 2x10 Standing horizontal ABD red tband 2x10 Seated forward punches with 2# 2x10 bilat Seated shoulder flexion with 2# 2x10 bilat Seated overhead press with 2# 2x10 bilat Seated OH flexion with ball, head straight due to neck pain - for thoracic ext  Seated trunk rotation with ball knee to opp shoulder x 10 B Open book x 10 B Prone on elbows neck diagonals in pain free range x 10 B Seated neck ext with hand blocking clavicles 2x30 sec Doorway stretch for chest 2x30 sec Seated cervical SNAGs for rotation x10 bilat Seated cervical SNAGs for extension x10  06/09/2023 Nustep level 2 x5 min with PT present to discuss status Seated cervical SNAGs for rotation x10 bilat Seated cervical SNAGs for extension x10  Seated forward punches with 1# 2x10 bilat Seated shoulder flexion with 1# 2x10 bilat Seated overhead press with 1# 2x10 bilat Seated 3 way green pball rollout 5x5 sec each Seated hamstring stretch 2x20 sec bilat Seated piriformis stretch 2x20 sec bilat Sit to/from stand holding 1# dumbbell in each hand x10 Standing rows with red tband 2x10 Standing shoulder ER with red tband 2x10 3 minute walk test:  471 ft   05/31/23 See pt ed and HEP   PATIENT EDUCATION:  Education details: PT eval findings, anticipated POC, initial HEP, and postural awareness  Person educated:  Patient Education method: Explanation, Demonstration, and Handouts Education comprehension: verbalized understanding and returned demonstration  HOME EXERCISE PROGRAM: Access Code: [email or web address] URL: [email or web address] Date: 06/17/2023 Prepared by: Raynelle Fanning  Exercises - Seated Table Hamstring Stretch  - 2 x daily - 7 x weekly - 1 sets - 3 reps - 30-60 sec hold - Seated Piriformis Stretch with Trunk Bend  - 2 x daily - 7 x weekly - 1 sets - 3 reps - 30-60 sec  hold - Seated Cervical Retraction  - 2 x daily - 7 x weekly - 1 sets - 10 reps - 5 hold - Seated Scapular Retraction  - 1 x daily - 7 x weekly - 1-3 sets - 10 reps - 2-3 sec hold - Sit to Stand with Arms Crossed  - 2-3 x daily - 3-4 x weekly - 1 sets - 10 reps - Seated Cervical Sidebending AROM  - 2 x daily - 7 x weekly - 1 sets - 5 reps - 10 hold - Seated Assisted Cervical Rotation with Towel  - 1 x daily - 3 x weekly - 1 sets - 10 reps - Cervical Extension AROM with Strap  - 1 x daily - 3-4 x weekly - 1 sets - 10 reps - Sidelying Thoracic Rotation with Open Book  - 1 x daily - 7 x weekly - 2 sets - 5 reps  ASSESSMENT:  CLINICAL IMPRESSION: JoAnne tolerated all exercises well and reports improved neck mobility at end of session. HEP was progressed with SNAGS and open book. Encouraged good posture throughout session.   OBJECTIVE IMPAIRMENTS: Abnormal gait, decreased balance, decreased ROM, decreased strength, increased muscle spasms, impaired flexibility, postural dysfunction, and pain.   ACTIVITY LIMITATIONS: lifting and [employer] classes  PARTICIPATION LIMITATIONS: driving and community activity  PERSONAL FACTORS: 1-2 comorbidities: spinal stenosis and PD  are also affecting patient's functional outcome.   REHAB POTENTIAL: Good  CLINICAL DECISION MAKING: Stable/uncomplicated  EVALUATION COMPLEXITY: Low   GOALS: Goals reviewed with patient? Yes  SHORT TERM GOALS: Target date: 06/28/2023   Patient will be  independent with initial HEP for her neck and back.  Baseline:  Goal status: Ongoing  2.  Decreased neck pain with driving by  16% Baseline:  Goal status: INITIAL   LONG TERM GOALS: Target date: 07/26/23   Patient will be independent with advanced/ongoing HEP to improve outcomes and carryover.  Baseline:  Goal status: INITIAL  2.  Patient will report 75% improvement in neck pain to improve QOL.  Baseline:  Goal status: INITIAL  3.  Patient will demonstrate full pain free cervical ROM for safety with driving.  Baseline:  Goal status:INITIAL  4.  Patient will report 5 on NDI to demonstrate improved functional ability.  Baseline: 13/50 Goal status: INITIAL  5.  Patient will be able to return to [facility] class with no increase in back pain.   Baseline:  Goal  status: INITIAL   6.  Patient to demonstrate 5xSTS by 2-3 seconds showing functional and LE strength improvements.   Baseline: 15.12 sec Goal status: INITIAL    PLAN:  PT FREQUENCY: 2x/week  PT DURATION:  12 sessions  PLANNED INTERVENTIONS: 97110-Therapeutic exercises, 97530- Therapeutic activity, [id number]- Neuromuscular re-education, 97535- Self Care, 72536- Manual therapy, [id number]- Aquatic Therapy, 97035- Ultrasound, 64403- Traction (mechanical), Patient/Family education, Balance training, Taping, Dry Needling, Joint mobilization, Spinal mobilization, Cryotherapy, and Moist heat.  PLAN FOR NEXT SESSION: work on posture to improve neck mobility and pain, DN/MT prn, LE flexibility; work on easing patient back into [employer] exercises without increased back pain.     Solon Palm, PT  06/17/23, 3:33 PM  [facility] [address] [place], [place] [address] Phone # [phone] Fax [phone]  PHYSICAL THERAPY DISCHARGE SUMMARY  Visits from Start of Care: 3  Current functional level related to goals / functional outcomes: Unable to assess as pt did not return for f/u  visits.    Remaining deficits: Unable to assess as pt did not return for f/u visits.    Education / Equipment: HEP   Patient agrees to discharge. Patient goals were not met. Patient is being discharged due to not returning since the last visit.   Solon Palm, PT 10/04/23 8:35 AM [facility] [address], [address] [place], [place] [address] Phone # [phone] Fax [phone]

## 2023-06-17 ENCOUNTER — Ambulatory Visit: Payer: PPO | Attending: Neurosurgery | Admitting: Physical Therapy

## 2023-06-17 ENCOUNTER — Encounter: Payer: Self-pay | Admitting: Physical Therapy

## 2023-06-17 DIAGNOSIS — M5459 Other low back pain: Secondary | ICD-10-CM | POA: Diagnosis not present

## 2023-06-17 DIAGNOSIS — R293 Abnormal posture: Secondary | ICD-10-CM | POA: Diagnosis not present

## 2023-06-17 DIAGNOSIS — M6281 Muscle weakness (generalized): Secondary | ICD-10-CM | POA: Insufficient documentation

## 2023-06-17 DIAGNOSIS — M542 Cervicalgia: Secondary | ICD-10-CM | POA: Insufficient documentation

## 2023-06-17 DIAGNOSIS — M79605 Pain in left leg: Secondary | ICD-10-CM | POA: Diagnosis not present

## 2023-08-10 NOTE — Progress Notes (Unsigned)
Assessment/Plan:    Parkinsons Disease, sx's since at least 2015 -Continue Rytary 245 mg, 1 po 8am/noon/4pm.  She looked a tad underdosed today, but overall she had been feeling well until she hurt her back so we decided not to change anything.  Discussed with her that we may need to increase further by increasing number of capsules.   2.  Parkinson's dyskinesia             -Patient to continue amantadine, 100 mg 3 times per day.  She is to dose these with Rytary.  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   3.  RBD             -Overall mild and no additional medication required.   4.  Cervical dystonia             -Patient not interested in Botox.  5  dysphagia  -Modified barium evaluation done on August 13, 2021.  There was evidence of mild oropharyngeal dysphagia.  Regular diet with thin liquids recommended.  6.  Memory change  -discussed neurocog testing and they decided to schedule that.  She is not scheduled out until March, 2025  7.  Weight loss/insomnia  -she is off of the mirtazapine.  She thought it made the dreaming worse.  8.  Low back pain with lumbar radiculopathy and synovial cyst  -Has seen Dr. Magnus Conley and Dr. Wynetta Conley Subjective:   Chelsea Conley was seen today in follow up for Parkinsons disease.  My previous records were reviewed prior to todays visit as well as outside records available to me.  Pt with daughter who supplements hx. last visit.  She is taking her Rytary faithfully.  Last visit, her biggest issues were coming from her low back and she is following with orthopedics and awaiting an MRI.  Since that time, she has seen Dr. Wynetta Conley.  This is in regards to her fairly sizable synovial cyst, but physical therapy was helping her pain.  Current prescribed movement disorder medications: Rytary 245 mg, 1 at 8 AM/noon/4 PM  Amantadine, 100 mg 3 times per day  Prior medications: Carbidopa/levodopa  IR (nausea/vomiting); selegiline (discontinued because of cost); carbidopa/levodopa CR (still with some nausea); mirtazapine (she thought it caused more dreams  ALLERGIES:  No Known Allergies  CURRENT MEDICATIONS:  No outpatient medications have been marked as taking for the 08/12/23 encounter (Appointment) with Chelsea Conley, Chelsea Batty, DO.     Objective:   PHYSICAL EXAMINATION:    VITALS:   There were no vitals filed for this visit.   Wt Readings from Last 3 Encounters:  04/05/23 134 lb (60.8 kg)  12/10/22 134 lb (60.8 kg)  08/18/22 134 lb 12.8 oz (61.1 kg)      GEN:  The patient appears stated age and is in NAD. HEENT:  Normocephalic, atraumatic.  The mucous membranes are moist. The superficial temporal arteries are without ropiness or tenderness. CV:  RRR Lungs:  CTAB Neck/HEME:  There are no carotid bruits bilaterally.  Neurological examination:  Orientation: The patient is alert and oriented x3. Cranial nerves: There is good facial symmetry with mild facial hypomimia. The speech is fluent and clear. Soft palate rises symmetrically and there is no tongue deviation. Hearing is intact to conversational tone. Sensation: Sensation is intact to light touch throughout Motor: Strength is at least antigravity x4.   Movement examination: Tone: There is mild increased tone in left upper  extremity.  There is normal tone on the right. Abnormal movements: There is left upper extremity rest tremor. Coordination:  There is mild decremation on the left. Gait and Station: Not tested today as she is in a transport chair and has acute back pain that is completely relieved when she sits  I have reviewed and interpreted the following labs independently    Chemistry      Component Value Date/Time   NA 139 12/10/2022 1135   NA 143 11/21/2019 1046   K 4.8 12/10/2022 1135   CL 104 12/10/2022 1135   CO2 27 12/10/2022 1135   BUN 23 12/10/2022 1135   BUN 15 11/21/2019 1046   CREATININE 0.86  12/10/2022 1135      Component Value Date/Time   CALCIUM 9.6 12/10/2022 1135   ALKPHOS 94 12/10/2022 1135   AST 17 12/10/2022 1135   ALT 3 12/10/2022 1135   BILITOT 0.4 12/10/2022 1135   BILITOT 0.4 11/21/2019 1046       Lab Results  Component Value Date   WBC 5.7 12/10/2022   HGB 13.2 12/10/2022   HCT 40.1 12/10/2022   MCV 88.0 12/10/2022   PLT 257.0 12/10/2022    No results found for: "TSH"   Total time spent on today's visit was *** minutes, including both face-to-face time and nonface-to-face time.  Time included that spent on review of records (prior notes available to me/labs/imaging if pertinent), discussing treatment and goals, answering patient's questions and coordinating care.  Cc:  Chelsea Rua, MD

## 2023-08-12 ENCOUNTER — Ambulatory Visit: Payer: PPO | Admitting: Neurology

## 2023-08-12 ENCOUNTER — Encounter: Payer: Self-pay | Admitting: Neurology

## 2023-08-12 VITALS — BP 116/72 | HR 80 | Ht 61.0 in | Wt 130.0 lb

## 2023-08-12 DIAGNOSIS — G20A1 Parkinson's disease without dyskinesia, without mention of fluctuations: Secondary | ICD-10-CM | POA: Diagnosis not present

## 2023-08-12 MED ORDER — RYTARY 61.25-245 MG PO CPCR: 245.0000 mg | ORAL_CAPSULE | Freq: Three times a day (TID) | ORAL | 1 refills | Status: DC

## 2023-08-23 ENCOUNTER — Encounter: Payer: Self-pay | Admitting: Psychology

## 2023-08-23 DIAGNOSIS — Z860100 Personal history of colon polyps, unspecified: Secondary | ICD-10-CM | POA: Insufficient documentation

## 2023-08-23 DIAGNOSIS — R945 Abnormal results of liver function studies: Secondary | ICD-10-CM

## 2023-08-23 DIAGNOSIS — R1011 Right upper quadrant pain: Secondary | ICD-10-CM | POA: Insufficient documentation

## 2023-08-23 DIAGNOSIS — G243 Spasmodic torticollis: Secondary | ICD-10-CM | POA: Insufficient documentation

## 2023-08-23 DIAGNOSIS — G4700 Insomnia, unspecified: Secondary | ICD-10-CM | POA: Insufficient documentation

## 2023-08-23 DIAGNOSIS — R748 Abnormal levels of other serum enzymes: Secondary | ICD-10-CM

## 2023-08-23 DIAGNOSIS — M7130 Other bursal cyst, unspecified site: Secondary | ICD-10-CM | POA: Insufficient documentation

## 2023-08-23 DIAGNOSIS — K5904 Chronic idiopathic constipation: Secondary | ICD-10-CM | POA: Insufficient documentation

## 2023-08-23 DIAGNOSIS — R141 Gas pain: Secondary | ICD-10-CM | POA: Insufficient documentation

## 2023-08-23 DIAGNOSIS — R1310 Dysphagia, unspecified: Secondary | ICD-10-CM | POA: Insufficient documentation

## 2023-08-23 DIAGNOSIS — G4752 REM sleep behavior disorder: Secondary | ICD-10-CM | POA: Insufficient documentation

## 2023-08-23 HISTORY — DX: Abnormal results of liver function studies: R94.5

## 2023-08-23 HISTORY — DX: Abnormal levels of other serum enzymes: R74.8

## 2023-08-24 ENCOUNTER — Ambulatory Visit: Payer: PPO | Admitting: Psychology

## 2023-08-24 ENCOUNTER — Encounter: Payer: Self-pay | Admitting: Psychology

## 2023-08-24 DIAGNOSIS — G20A1 Parkinson's disease without dyskinesia, without mention of fluctuations: Secondary | ICD-10-CM

## 2023-08-24 DIAGNOSIS — R4189 Other symptoms and signs involving cognitive functions and awareness: Secondary | ICD-10-CM

## 2023-08-24 NOTE — Progress Notes (Unsigned)
NEUROPSYCHOLOGICAL EVALUATION Chelsea Conley. [facility] Department of Neurology  Date of Evaluation: August 24, 2023  Reason for Referral:   Chelsea Conley is a 73 y.o. left-handed Caucasian female referred by Kerin Salen, D.O., to characterize her current cognitive functioning and assist with diagnostic clarity and treatment planning in the context of Parkinson's disease and concern for progressive cognitive decline.   Assessment and Plan:   Clinical Impression(s): Chelsea Conley's pattern of performance is suggestive of neuropsychological functioning largely within normal limits relative to age-matched peers. An isolated relative weakness was exhibited across nonverbal reasoning and phonemic fluency; however, all other tasks assessing receptive language, expressive language, and executive functioning were normatively appropriate. She also exhibited a relative weakness learning and later retrieving a list of words from memory. However, she benefited from cueing and performance across three other verbal and visual memory tasks were strong, thus not suggestive of a consistent memory impairment. Performances were also appropriate relative to age-matched peers across processing speed, attention/concentration, and visuospatial abilities. I do not believe that she meets diagnostic criteria for a neurocognitive disorder at the present time.   The most likely cause for mild weaknesses across current testing as well as occasional difficulties throughout day-to-day life remains her history of Parkinson's disease. Situational difficulties may also be influenced by acute degrees of stress and/or fatigue. Despite some variability, memory performances are not worrisome for underlying Alzheimer's disease at the present time. Likewise, she does not report behavioral symptoms worrisome for Lewy body disease or frontotemporal lobar degeneration, making these presentations unlikely. There is no  recent neuroimaging available to review to understand any vascular contributions or other potential anatomical abnormalities which could influence day-to-day functioning. Continued monitoring will be beneficial moving forward.   Recommendations: A repeat neuropsychological evaluation in 24-36 months (or sooner if functional decline is noted) could be considered to assess the trajectory of future cognitive decline should it occur. This will also aid in future efforts towards improved diagnostic clarity.  A brain MRI could be considered given that one has not been performed previously (based on what is available to me in her medical records). If desired, she could discuss the necessity of this with Dr. Arbutus Leas.  Performance across neurocognitive testing is not a strong predictor of an individual's safety operating a motor vehicle. Should her family wish to pursue a formalized driving evaluation, they could reach out to the following agencies: [facility] [employer] [facility] in [place]: [phone] [facility]: [phone] [facility]: [phone] [facility]: [phone] or [phone]  Should there be progression of current deficits over time, Chelsea Conley is unlikely to regain any independent living skills lost. Therefore, it is recommended that she remain as involved as possible in all aspects of household chores, finances, and medication management, with supervision to ensure adequate performance. She will likely benefit from the establishment and maintenance of a routine in order to maximize her functional abilities over time.  If not already done, Chelsea Conley and her family may want to discuss her wishes regarding durable power of attorney and medical decision making, so that she can have input into these choices. If they require legal assistance with this, long-term care resource access, or other aspects of estate planning, they could reach out to [facility] at [phone] for a free consultation.  [name] is encouraged to attend to lifestyle factors for brain health (e.g., regular physical exercise, good nutrition habits and consideration of the MIND-DASH diet, regular participation in cognitively-stimulating activities,  and general stress management techniques), which are likely to have benefits for both emotional adjustment and cognition. Optimal control of vascular risk factors (including safe cardiovascular exercise and adherence to dietary recommendations) is encouraged. Continued participation in activities which provide mental stimulation and social interaction is also recommended.   Memory can be improved using internal strategies such as rehearsal, repetition, chunking, mnemonics, association, and imagery. External strategies such as written notes in a consistently used memory journal, visual and nonverbal auditory cues such as a calendar on the refrigerator or appointments with alarm, such as on a cell phone, can also help maximize recall.    When learning new information, she would benefit from information being broken up into small, manageable pieces. She may also find it helpful to articulate the material in her own words and in a context to promote encoding at the onset of a new task. This material may need to be repeated multiple times to promote encoding. Because she shows better recall for structured information, she will likely understand and retain new information better if it is presented to her in a meaningful or well-organized manner at the outset, such as grouping items into meaningful categories or presenting information in an outlined, bulleted, or story format.  To address problems with fluctuating attention and/or executive dysfunction, she may wish to consider:   -Avoiding external distractions when needing to concentrate   -Limiting exposure to fast paced environments with multiple sensory demands   -Writing down  complicated information and using checklists   -Attempting and completing one task at a time (i.e., no multi-tasking)   -Verbalizing aloud each step of a task to maintain focus   -Taking frequent breaks during the completion of steps/tasks to avoid fatigue   -Reducing the amount of information considered at one time   -Scheduling more difficult activities for a time of day where she is usually most alert  Review of Records:   Ms. Maes was most recently seen by [facility] Lurena Joiner Tat, D.O.) for follow-up of Parkinson's disease first diagnosed in 2015. Medications and other forms of ongoing treatment were discussed. Her family expressed some concern surrounding progressive memory decline. As such, Ms. Barthel was referred for a comprehensive neuropsychological evaluation to characterize her cognitive abilities and to assist with diagnostic clarity and treatment planning.   Neuroimaging: Head CT on 10/10/2004 in the context of seizure activity was negative. No other neuroimaging was available for review.   Past Medical History:  Diagnosis Date   Abnormal levels of other serum enzymes 08/23/2023   Abnormal liver function 08/23/2023   Arthritis of carpometacarpal [facility]) joint of right thumb 12/21/2018   Cervical dystonia    Chronic idiopathic constipation    Dysphagia    Eustachian tube dysfunction, left 08/18/2021   History of colonic polyps    Hyperlipidemia 05/15/2015   Insomnia    Parkinson's disease 05/15/2015   REM sleep behaviors, mild    Seizures 2007   2007 last seizure   Synovial cyst    Tremor 05/15/2015    Past Surgical History:  Procedure Laterality Date   FOOT SURGERY Bilateral    MOUTH SURGERY     X2   TONSILLECTOMY      Current Outpatient Medications:    amantadine (SYMMETREL) 100 MG capsule, TAKE 1 CAP BY MOUTH EVERY MONRING AT NOON AND AT BEDTIME, Disp: 270 capsule, Rfl: 0   Carbidopa-Levodopa ER (RYTARY) 61.25-245 MG CPCR, Take 245 mg by mouth 3  (three) times daily. 8am/noon/4pm/bed, Disp: 360  capsule, Rfl: 1  Clinical Interview:   The following information was obtained during a clinical interview with Ms. Wahlen prior to cognitive testing.  Cognitive Symptoms: Decreased short-term memory: Largely denied. She acknowledged that her memory is "not what it used to be" and reported some difficulties entering rooms and forgetting her intention or trouble recalling names from time to time. She noted that difficulties may have been present for the past year or so and seemed stable. However, overall, she reported being "not real concerned" surrounding memory changes.  Decreased long-term memory: Denied. Decreased attention/concentration: Denied. Reduced processing speed: Endorsed "maybe a little bit." Difficulties with executive functions: Denied. Difficulties with emotion regulation: Denied. She also denied any recent personality changes. Difficulties with receptive language: Denied. Difficulties with word finding: Denied. Decreased visuoperceptual ability: Endorsed. Specifically, she noted a greater tendency to turn a corner too quickly and may impact door frames or other things in her environment from time to time.   Difficulties completing ADLs: Denied.  Additional Medical History: History of traumatic brain injury/concussion: Denied. History of stroke: Denied. History of seizure activity: Endorsed. She reported sporadic seizure activity ending in 2007. She stated that the cause of these few isolated instances was never able to be determined. No events were reported since 2007.  History of known exposure to toxins: Denied. Symptoms of chronic pain: Denied. Experience of frequent headaches/migraines: Denied. Frequent instances of dizziness/vertigo: Denied.  Sensory changes: She wears glasses with benefit and reported a mildly diminished sense of smell. Other sensory changes/difficulties (e.g., hearing or taste) were denied.   Balance/coordination difficulties: Endorsed. She reported ongoing balance instability related to her history of Parkinson's disease, with her left side exhibiting greater difficulties. She denied any recent falls.  Other motor difficulties: Endorsed. Tremors are associated with Parkinson's disease and are worse in her left hand relative to her right.   Sleep History: Estimated hours obtained each night: 6 hours. She described this as being longstanding and normal.  Difficulties falling asleep: Denied. Difficulties staying asleep: Denied. Feels rested and refreshed upon awakening: Endorsed.  History of snoring: Denied. History of waking up gasping for air: Denied. Witnessed breath cessation while asleep: Denied.  History of vivid dreaming: Endorsed. Excessive movement while asleep: Denied. Instances of acting out her dreams: Denied. She did report her husband noting that she will talk in her sleep from time to time. She denied her knowledge of any physical movements.   Psychiatric/Behavioral Health History: Depression: She described her current mood as "pretty good" and denied to her knowledge any prior mental health concerns or formal diagnoses. Current or remote suicidal ideation, intent, or plan was denied.  Anxiety: Denied. Mania: Denied. Trauma History: Denied. Visual/auditory hallucinations: Denied. Delusional thoughts: Denied.  Tobacco: Denied. Alcohol: She denied current alcohol consumption as well as a history of problematic alcohol abuse or dependence.  Recreational drugs: Denied.  Family History: Problem Relation Age of Onset   Parkinson's disease Mother    Diabetes Father    Diabetes Sister    Diabetes Brother    Parkinson's disease Maternal Grandfather    This information was confirmed by Ms. Cafarelli.  Academic/Vocational History: Highest level of educational attainment: 12 years. 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 Math was noted as a relative weakness. History of developmental delay: Denied. History of grade repetition: Denied. Enrollment in special education courses: Denied. History of LD/ADHD: Denied.  Employment: Retired. She previously worked in Engineer, maintenance ([employer]).  Evaluation Results:   Behavioral Observations: Ms. Chou was unaccompanied, arrived to her appointment on time, and was appropriately dressed and groomed. She appeared alert. Observed gait and station were within normal limits. Mild resting tremors were observed in her hands, her left greater than her right. Her affect was generally relaxed and positive. Spontaneous speech was fluent and word finding difficulties were not observed during the clinical interview. Thought processes were coherent, organized, and normal in content. Insight into her cognitive difficulties appeared adequate.   Due to ongoing tremors, motorized tasks were substituted with non-motor analogs where possible. During testing, sustained attention was appropriate. Task engagement was adequate and she persisted when challenged. Overall, Ms. Viti was cooperative with the clinical interview and subsequent testing procedures.   Adequacy of Effort: The validity of neuropsychological testing is limited by the extent to which the individual being tested may be assumed to have exerted adequate effort during testing. Ms. Teacher, adult education expressed her intention to perform to the best of her abilities and exhibited adequate task engagement and persistence. Scores across stand-alone and embedded performance validity measures were within expectation. As such, the results of the current evaluation are believed to be a valid representation of Ms. Lamoreaux's current cognitive functioning.  Test Results: Ms. Dlugosz was fully oriented at the time of the current evaluation.  Intellectual abilities based upon educational and  vocational attainment were estimated to be in the average range. Premorbid abilities were estimated to be within the average range based upon a single-word reading test.   Processing speed was average. Basic attention was average. More complex attention (e.g., working memory) was below average. Executive functioning was average outside of an isolated well below average performance across a nonverbal reasoning task.  Assessed receptive language abilities were average. Likewise, Ms. Knerr did not exhibit any difficulties comprehending task instructions and answered all questions asked of her appropriately. Assessed expressive language was somewhat variable. Phonemic fluency was well below average to below average, semantic fluency was average, and confrontation naming was above average.     Assessed visuospatial/visuoconstructional abilities were average.    Learning (i.e., encoding) of novel verbal and visual information was mildly variable but overall appropriate, ranging from the below average to well above average normative ranges. Spontaneous delayed recall (i.e., retrieval) of previously learned information was exceptionally low across a list learning task but average to above average across all other tasks. Retention rates were 0% across a list learning task, 92% across a story learning task, 76% across a daily living task, and 100% across a shape learning task. Performance across recognition tasks was mildly variable but overall appropriate, ranging from the below average to above average normative ranges, suggesting evidence for information consolidation.   Results of emotional screening instruments suggested that recent symptoms of generalized anxiety were in the minimal range, while symptoms of depression were within normal limits. A screening instrument assessing recent sleep quality suggested the presence of minimal sleep dysfunction.  Tables of Scores:   Note: This summary of test  scores accompanies the interpretive report and should not be considered in isolation without reference to the appropriate sections in the text. Descriptors are based on appropriate normative data and may be adjusted based on clinical judgment. Terms such as "Within Normal Limits" and "Outside Normal Limits" are used when a more specific description of the test score cannot be determined.       Percentile - Normative Descriptor > 98 - Exceptionally High 91-97 - Well Above Average 75-90 - Above  Average 25-74 - Average 9-24 - Below Average 2-8 - Well Below Average < 2 - Exceptionally Low       Validity:   DESCRIPTOR       ACS WC: --- --- Within Normal Limits  DCT: --- --- Within Normal Limits       Orientation:      Raw Score Percentile   NAB Orientation, Form 1 29/29 --- ---       Cognitive Screening:      Raw Score Percentile   SLUMS: 23/30 --- ---       Intellectual Functioning:      Standard Score Percentile   Test of Premorbid Functioning: 101 53 Average       Wechsler Adult Intelligence Scale (WAIS-5):  Standard Score/ Scaled Score Percentile   General Abilities Index 85 16 Below Average    Similarities  9 37 Average    Vocabulary 8 25 Average    Block Design  8 25 Average    Matrix Reasoning 5 5 Well Below Average    Figure Weights 8 25 Average       Memory:     NAB Memory Module, Form 1: Standard Score/ T Score Percentile   Total Memory Index 95 37 Average  List Learning       Total Trials 1-3 16/36 (39) 14 Below Average    List B 4/12 (51) 54 Average    Short Delay Free Recall 0/12 (22) <1 Exceptionally Low    Long Delay Free Recall 2/12 (33) 5 Well Below Average    Retention Percentage 0 (22) <1 Exceptionally Low    Recognition Discriminability 4 (42) 21 Below Average  Shape Learning       Total Trials 1-3 16/27 (56) 73 Average    Delayed Recall 6/9 (56) 73 Average    Retention Percentage 100 (50) 50 Average    Recognition Discriminability 8 (57) 75 Above  Average  Story Learning       Immediate Recall 63/80 (53) 62 Average    Delayed Recall 36/40 (61) 86 Above Average    Retention Percentage 92 (50) 50 Average  Daily Living Memory       Immediate Recall 48/51 (68) 96 Well Above Average    Delayed Recall 13/17 (48) 42 Average    Retention Percentage 76 (44) 27 Average    Recognition Hits 10/10 (61) 86 Above Average       Attention/Executive Function:     Oral Trail Making Test (OTMT): Raw Score (Z-Score) Percentile     Part A 8 secs.,  0 errors (-0.33) 37 Average    Part B 38 secs.,  1 error (0.13) 55 Average       Symbol Digit Modalities Test (SDMT): Raw Score (Z-Score) Percentile     Oral 41 (-0.09) 46 Average        Scaled Score Percentile   WAIS-5 Naming Speed Quantity: 11 63 Average        Scaled Score Percentile   WAIS-5 Digits Forwards: 11 63 Average  WAIS-5 Digit Sequencing: 7 16 Below Average        Scaled Score Percentile   WAIS-5 Similarities: 9 37 Average  WAIS-5 Matrix Reasoning: 5 5 Well Below Average  WAIS-5 Figure Weights: 8 25 Average       D-KEFS Verbal Fluency Test: Raw Score (Scaled Score) Percentile     Letter Total Correct 23 (6) 9 Below Average    Category Total Correct 36 (11) 63 Average  Category Switching Total Correct 12 (10) 50 Average    Category Switching Accuracy 11 (10) 50 Average      Total Set Loss Errors 0 (13) 84 Above Average      Total Repetition Errors 0 (13) 84 Above Average       Language:     Verbal Fluency Test: Raw Score (T Score) Percentile     Phonemic Fluency (FAS) 23 (36) 8 Well Below Average    Animal Fluency 17 (49) 46 Average        NAB Language Module, Form 1: T Score Percentile     Auditory Comprehension 47 38 Average    Naming 31/31 (59) 82 Above Average       Visuospatial/Visuoconstruction:      Raw Score Percentile   Clock Drawing: 8/10 --- Within Normal Limits        Scaled Score Percentile   WAIS-5 Block Design: 8 25 Average  WAIS-5 Visual Puzzles: 9  37 Average       Mood and Personality:      Raw Score Percentile   Beck Depression Inventory - II: 3 --- Within Normal Limits  PROMIS Anxiety Questionnaire: 11 --- None to Slight       Additional Questionnaires:      Raw Score Percentile   PROMIS Sleep Disturbance Questionnaire: 19 --- None to Slight   Informed Consent and Coding/Compliance:   The current evaluation represents a clinical evaluation for the purposes previously outlined by the referral source and is in no way reflective of a forensic evaluation.   Ms. Lauman was provided with a verbal description of the nature and purpose of the present neuropsychological evaluation. Also reviewed were the foreseeable risks and/or discomforts and benefits of the procedure, limits of confidentiality, and mandatory reporting requirements of this provider. The patient was given the opportunity to ask questions and receive answers about the evaluation. Oral consent to participate was provided by the patient.   This evaluation was conducted by Newman Nickels, Ph.D., ABPP-CN, board certified clinical neuropsychologist. Ms. Iturralde completed a clinical interview with Dr. Milbert Coulter, billed as one unit [phone], and 145 minutes of cognitive testing and scoring, billed as one unit [phone] and four additional units [id number]. Psychometrist Wallace Keller, B.S. assisted Dr. Milbert Coulter with test administration and scoring procedures. As a separate and discrete service, one unit [id number] and two units [phone] were billed for Dr. Tammy Sours time spent in interpretation and report writing.

## 2023-08-24 NOTE — Progress Notes (Signed)
   Psychometrician Note   Cognitive testing was administered to [employer] by Wallace Keller, B.S. (psychometrist) under the supervision of Dr. Newman Nickels, Ph.D., ABPP, licensed psychologist on 08/24/2023. Ms. Forrey did not appear overtly distressed by the testing session per behavioral observation or responses across self-report questionnaires. Rest breaks were offered.    The battery of tests administered was selected by Dr. Newman Nickels, Ph.D., ABPP with consideration to Chelsea Conley's current level of functioning, the nature of her symptoms, emotional and behavioral responses during interview, level of literacy, observed level of motivation/effort, and the nature of the referral question. This battery was communicated to the psychometrist. Communication between Dr. Newman Nickels, Ph.D., ABPP and the psychometrist was ongoing throughout the evaluation and Dr. Newman Nickels, Ph.D., ABPP was immediately accessible at all times. Dr. Newman Nickels, Ph.D., ABPP provided supervision to the psychometrist on the date of this service to the extent necessary to assure the quality of all services provided.    Chelsea Conley will return within approximately 1-2 weeks for an interactive feedback session with Dr. Milbert Coulter at which time her test performances, clinical impressions, and treatment recommendations will be reviewed in detail. Chelsea Conley, adult education understands she can contact our office should she require our assistance before this time.  A total of 145 minutes of billable time were spent face-to-face with Chelsea Conley by the psychometrist. This includes both test administration and scoring time. Billing for these services is reflected in the clinical report generated by Dr. Newman Nickels, Ph.D., ABPP  This note reflects time spent with the psychometrician and does not include test scores or any clinical interpretations made by Dr. Milbert Coulter. The full report will follow in a separate note.

## 2023-08-25 ENCOUNTER — Encounter: Payer: Self-pay | Admitting: Podiatry

## 2023-08-25 ENCOUNTER — Ambulatory Visit: Payer: PPO | Admitting: Podiatry

## 2023-08-25 ENCOUNTER — Ambulatory Visit (INDEPENDENT_AMBULATORY_CARE_PROVIDER_SITE_OTHER): Payer: PPO

## 2023-08-25 DIAGNOSIS — M79672 Pain in left foot: Secondary | ICD-10-CM | POA: Diagnosis not present

## 2023-08-25 DIAGNOSIS — M21271 Flexion deformity, right ankle and toes: Secondary | ICD-10-CM

## 2023-08-25 DIAGNOSIS — L600 Ingrowing nail: Secondary | ICD-10-CM

## 2023-08-25 NOTE — Patient Instructions (Signed)

## 2023-08-25 NOTE — Progress Notes (Signed)
Subjective:   Patient ID: Chelsea Conley, female   DOB: 73 y.o.   MRN: [id number]   HPI Patient presents stating that she has Parkinson's and her big toe left seems to have elevated and she has a lot of pain on top and she is not sure if it may also be the nail.  Patient does have Parkinson's   Review of Systems  All other systems reviewed and are negative.       Objective:  Physical Exam Vitals and nursing note reviewed.  Constitutional:      Appearance: She is well-developed.  Pulmonary:     Effort: Pulmonary effort is normal.  Musculoskeletal:        General: Normal range of motion.  Skin:    General: Skin is warm.  Neurological:     Mental Status: She is alert.     Neurovascular status intact with patient found to have incurvated medial border of the left big toe painful when pressed localized no active drainage noted some thickness of the entire nail bed itself..  Most of the pain is in the medial border of the hallux     Assessment:  Problem that may be due to Parkinson's with elevated left big toe with ingrown toenail deformity left hallux     Plan:  H&P reviewed with her and caregiver and we could consider first MPJ fusion but first I want to try conservative and I went ahead today and I want to try to take out the nail and see whether or not this will solve the problem.  We are just going to work on the medial border I went ahead and I anesthetized 60 mg like Marcaine mixture sterile prep done using sterile instrumentation removed the medial border exposed matrix applied phenol 3 applications 30 seconds followed by alcohol lavage sterile dressing gave instructions on soaks wear dressing 24 hours take it off earlier if throbbing were to occur  X-rays indicate there is a screw in the first metatarsal mild elevation of the toe noted upon

## 2023-09-02 ENCOUNTER — Other Ambulatory Visit: Payer: Self-pay | Admitting: Neurology

## 2023-09-02 ENCOUNTER — Ambulatory Visit (INDEPENDENT_AMBULATORY_CARE_PROVIDER_SITE_OTHER): Payer: PPO | Admitting: Psychology

## 2023-09-02 DIAGNOSIS — G20B1 Parkinson's disease with dyskinesia, without mention of fluctuations: Secondary | ICD-10-CM

## 2023-09-02 DIAGNOSIS — G20A1 Parkinson's disease without dyskinesia, without mention of fluctuations: Secondary | ICD-10-CM

## 2023-09-02 NOTE — Progress Notes (Signed)
   Neuropsychology Feedback Session Chelsea Conley. [facility] [place] Department of Neurology  Reason for Referral:   Chelsea Conley is a 73 y.o. left-handed Caucasian female referred by Kerin Salen, D.O., to characterize her current cognitive functioning and assist with diagnostic clarity and treatment planning in the context of Parkinson's disease and concern for progressive cognitive decline.   Feedback:   Chelsea Conley completed a comprehensive neuropsychological evaluation on 08/24/2023. Please refer to that encounter for the full report and recommendations. Briefly, results suggested neuropsychological functioning largely within normal limits relative to age-matched peers. An isolated relative weakness was exhibited across nonverbal reasoning and phonemic fluency; however, all other tasks assessing receptive language, expressive language, and executive functioning were normatively appropriate. She also exhibited a relative weakness learning and later retrieving a list of words from memory. However, she benefited from cueing and performance across three other verbal and visual memory tasks were strong, thus not suggestive of a consistent memory impairment. The most likely cause for mild weaknesses across current testing as well as occasional difficulties throughout day-to-day life remains her history of Parkinson's disease. Situational difficulties may also be influenced by acute degrees of stress and/or fatigue. Despite some variability, memory performances are not worrisome for underlying Alzheimer's disease at the present time. Likewise, she does not report behavioral symptoms worrisome for Lewy body disease or frontotemporal lobar degeneration, making these presentations unlikely. There is no recent neuroimaging available to review to understand any vascular contributions or other potential anatomical abnormalities which could influence day-to-day functioning. Continued monitoring will be  beneficial moving forward.  Chelsea Conley was unaccompanied during the current telephone call. She was within her residence while I was within my office. I discussed the limitations of evaluation and management by telemedicine and the availability of in person appointments. Chelsea Conley expressed her understanding and agreed to proceed. Content of the current session focused on the results of her neuropsychological evaluation. Chelsea Conley was given the opportunity to ask questions and her questions were answered. She was encouraged to reach out should additional questions arise. A copy of her report was mailed at the conclusion of the visit.      One unit [id number] was billed for Dr. Tammy Sours time spent preparing for, conducting, and documenting the current feedback session with Chelsea Conley.

## 2023-09-29 ENCOUNTER — Institutional Professional Consult (permissible substitution): Payer: PPO | Admitting: Psychology

## 2023-09-29 ENCOUNTER — Ambulatory Visit: Payer: Self-pay

## 2023-10-04 ENCOUNTER — Telehealth: Payer: Self-pay | Admitting: Neurology

## 2023-10-04 NOTE — Telephone Encounter (Signed)
 Caller stated she would like to speak with nurse, stated she's having problems walking and moving her feet.

## 2023-10-04 NOTE — Telephone Encounter (Signed)
 Patient is taking mg Rytary 245 mg, 1 po 8am/noon/4pm and add dose at bedtime. amantadine, 100 mg 3 times per day  She said it started a week ago and she is struggling to walk. Patient did say she is going to visit her daughter who is having breast cancer surgery on Friday and she is scared she wont be able to walk

## 2023-10-04 NOTE — Telephone Encounter (Signed)
 Called and told patient about trying an extra pill prn when she feels like she needs it

## 2023-10-06 ENCOUNTER — Encounter: Payer: PPO | Admitting: Psychology

## 2023-11-03 DIAGNOSIS — H43813 Vitreous degeneration, bilateral: Secondary | ICD-10-CM | POA: Diagnosis not present

## 2023-11-03 DIAGNOSIS — H04123 Dry eye syndrome of bilateral lacrimal glands: Secondary | ICD-10-CM | POA: Diagnosis not present

## 2023-11-03 DIAGNOSIS — H2513 Age-related nuclear cataract, bilateral: Secondary | ICD-10-CM | POA: Diagnosis not present

## 2023-11-03 DIAGNOSIS — H35372 Puckering of macula, left eye: Secondary | ICD-10-CM | POA: Diagnosis not present

## 2023-11-18 ENCOUNTER — Ambulatory Visit: Payer: PPO

## 2023-11-18 ENCOUNTER — Ambulatory Visit: Payer: PPO | Admitting: Occupational Therapy

## 2023-11-30 NOTE — Progress Notes (Signed)
 Assessment/Plan:    Parkinsons Disease, sx's since at least 2015 - Discussed possibly increasing medication, but she actually looks quite well today and she is happy.  Continue Rytary  245 mg, 1 po 8am/noon/4pm/bedtime.  She can take extra prn -increase exercise and she and I talked about this in detail. - Daughter asked about DBS surgery and we talked about fact that she would likely be a candidate now given motor fluctuations, but her symptoms are quite mild.  I am happy to explore this with them if they would like but we all agreed that she is not ready.   2.  Parkinson's dyskinesia             -Patient to continue amantadine , 100 mg 3 times per day.  She does have livedo reticularis (very mild) and understands that this is a consequence of the amantadine .   3.  RBD             -Overall mild and no additional medication required.   4.  Cervical dystonia             -Patient not interested in Botox.  5  dysphagia  -Modified barium evaluation done on August 13, 2021.  There was evidence of mild oropharyngeal dysphagia.  Regular diet with thin liquids recommended.  6.  Memory change  - She saw Dr. Kitty Perkins for neurocognitive testing in February, 2025.  This was normal.  7.  Weight loss/insomnia  -she is off of the mirtazapine .  She thought it made the dreaming worse.  8.  Low back pain with lumbar radiculopathy and synovial cyst  -Has seen Dr. Lucienne Ryder and Dr. Lamon Pillow Subjective:   Chelsea Conley was seen today in follow up for Parkinsons disease.  My previous records were reviewed prior to todays visit as well as outside records available to me.  Pt with daughter who supplements hx. last visit.  We added a bedtime dose of Rytary  last visit, and told her today I may consider adding an additional morning dose, but wanted to see how she did with the bedtime dose first.  She reports today that she did well with that for about 2 weeks and then she had trouble walking for some weeks and now  she has been doing well for several week.  No falls.    She did do neurocognitive testing with Dr. Kitty Perkins in February, 2025, which was normal.  She reports that sometimes exercise is limited because of her back, and her daughter thinks that she is not doing as good in this regard as she perhaps should.  Current prescribed movement disorder medications: Rytary  245 mg, 1 at 8 AM/noon/4 PM/bedtime (bedtime dose added last visit) Amantadine , 100 mg 3 times per day  Prior medications: Carbidopa /levodopa  IR (nausea/vomiting); selegiline  (discontinued because of cost); carbidopa /levodopa  CR (still with some nausea); mirtazapine  (she thought it caused more dreams)  ALLERGIES:  No Known Allergies  CURRENT MEDICATIONS:  Current Meds  Medication Sig   amantadine  (SYMMETREL ) 100 MG capsule TAKE 1 CAP BY MOUTH EVERY MONRING AT NOON AND AT BEDTIME   Carbidopa -Levodopa  ER (RYTARY ) 61.25-245 MG CPCR Take 245 mg by mouth 3 (three) times daily. 8am/noon/4pm/bed     Objective:   PHYSICAL EXAMINATION:    VITALS:   Vitals:   12/01/23 1531  BP: 124/78  Pulse: 94  SpO2: 98%  Weight: 131 lb 9.6 oz (59.7 kg)  Height: 5\' 1"  (1.549 m)      Wt Readings from Last 3  Encounters:  12/01/23 131 lb 9.6 oz (59.7 kg)  08/12/23 130 lb (59 kg)  04/05/23 134 lb (60.8 kg)    GEN:  The patient appears stated age and is in NAD. HEENT:  Normocephalic, atraumatic.  The mucous membranes are moist. The superficial temporal arteries are without ropiness or tenderness. CV:  RRR Lungs:  CTAB Neck/HEME:  There are no carotid bruits bilaterally.  Neurological examination:  Orientation: The patient is alert and oriented x3. Cranial nerves: There is good facial symmetry with mild facial hypomimia. The speech is fluent and clear. Soft palate rises symmetrically and there is no tongue deviation. Hearing is intact to conversational tone. Sensation: Sensation is intact to light touch throughout Motor: Strength is at least  antigravity x4.   Movement examination: Tone: There is mild increased tone in the bilateral UE Abnormal movements: There is left upper extremity rest tremor, intermittent and mild.  There is mild dyskinesia. Coordination:  There is no significant decremation today. Gait and Station: She rises out of the chair easily.  She walks well in the hall.  She has very minimal hesitation in the turn.  She has good armswing.  I have reviewed and interpreted the following labs independently    Chemistry      Component Value Date/Time   NA 139 12/10/2022 1135   NA 143 11/21/2019 1046   K 4.8 12/10/2022 1135   CL 104 12/10/2022 1135   CO2 27 12/10/2022 1135   BUN 23 12/10/2022 1135   BUN 15 11/21/2019 1046   CREATININE 0.86 12/10/2022 1135      Component Value Date/Time   CALCIUM 9.6 12/10/2022 1135   ALKPHOS 94 12/10/2022 1135   AST 17 12/10/2022 1135   ALT 3 12/10/2022 1135   BILITOT 0.4 12/10/2022 1135   BILITOT 0.4 11/21/2019 1046       Lab Results  Component Value Date   WBC 5.7 12/10/2022   HGB 13.2 12/10/2022   HCT 40.1 12/10/2022   MCV 88.0 12/10/2022   PLT 257.0 12/10/2022    No results found for: "TSH"   Total time spent on today's visit was 30 minutes, including both face-to-face time and nonface-to-face time.  Time included that spent on review of records (prior notes available to me/labs/imaging if pertinent), discussing treatment and goals, answering patient's questions and coordinating care.  Cc:  Wyn Heater, MD

## 2023-12-01 ENCOUNTER — Ambulatory Visit: Admitting: Neurology

## 2023-12-01 VITALS — BP 124/78 | HR 94 | Ht 61.0 in | Wt 131.6 lb

## 2023-12-01 DIAGNOSIS — G20B2 Parkinson's disease with dyskinesia, with fluctuations: Secondary | ICD-10-CM

## 2023-12-01 DIAGNOSIS — G8929 Other chronic pain: Secondary | ICD-10-CM

## 2023-12-01 DIAGNOSIS — M545 Low back pain, unspecified: Secondary | ICD-10-CM | POA: Diagnosis not present

## 2023-12-01 DIAGNOSIS — G20B1 Parkinson's disease with dyskinesia, without mention of fluctuations: Secondary | ICD-10-CM | POA: Diagnosis not present

## 2023-12-01 MED ORDER — AMANTADINE HCL 100 MG PO CAPS: 100.0000 mg | ORAL_CAPSULE | Freq: Three times a day (TID) | ORAL | 2 refills | Status: AC

## 2023-12-01 NOTE — Patient Instructions (Signed)
 The physicians and staff at [facility] are committed to providing excellent care. You may receive a survey requesting feedback about your experience at our office. We strive to receive "very good" responses to the survey questions. If you feel that your experience would prevent you from giving the office a "very good " response, please contact our office to try to remedy the situation. We may be reached at [phone]. Thank you for taking the time out of your busy day to complete the survey.  DIETARY GUIDELINES Research suggests that Parkinson's disease symptoms might be improved by increasing the levels of anti-oxidant and anti-inflammatory compounds in your brain. This means eating more whole, unprocessed plant foods. The more variety and color in your diet the better! Don't spend a lot of money on supplements, spend it on fresh healthy food; food is medicine! Eat what you need to eat to be happy, but eat it as a 'treat', not as a staple food, and eat a lot more of the food that is good for your health. Every healthy lifestyle change helps and most people do better making gradual minor changes that become major over time.  1) Eat at least 7-9 servings of fruits and vegetables a day, including:  Eat lots more berries (blueberries, cherries, raspberries, goji berries, cranberries) and fruits (apples, pears, oranges, plums, prunes, apricots, peaches, nectarines, watermelon, grapefruit, pineapple, bananas) fresh, frozen or dried, all are healthy options.   Eat lots more dark leafy greens (spinach, romaine lettuce, arugula, Swiss chard, turnip greens, radicchio), cruciferous vegetables (broccoli, cauliflower, cabbage, red cabbage, collard greens, kale, brussel sprouts) and allium vegetables (onion, shallot, garlic, spring onion). Cut these vegetables up in small pieces and let them sit for a few minutes before steaming or microwaving.   Eat lots more colorful vegetables like beets, asparagus, red  pepper, tomatoes, carrots, sweet potato, squash, pumpkin and other vegetables that are bright green, red, yellow and don't always eat the same ones. If you are cooking them, steam them or microwave them.  2) On your salads, make your own dressing and include a source of healthy fat (almonds, flax seeds, walnuts, cashews, tahini, sunflower seeds, extra virgin olive oil) to help absorb the anti-oxidants.  3) Drink more liquid including lots of tea (white tea, green tea, rooibos tea, hibiscus tea, rosehip tea) and less dairy milk, soda, and clear fruit juice. Coffee, beer and wine are good too, in moderation. 4) Eat LOTS more spices (turmeric, pepper, cloves, nutmeg, oregano, basil, [place] cinnamon, thyme, rosemary, ginger). Include these in your daily oatmeal, salads, roasted vegetables, etc. Try traditional blends of spices from different cultures. 5) Eat at least 1-2 tablespoons ground flax seeds daily. Ideally, buy them whole and grind them yourself as needed. Keep them and most foods with high fat refrigerated. 6) Eat more mushrooms, cooked. Any kind of mushroom is fine. 7) Don't eat lots of fish and if you do, eat Pacific wild caught salmon or small fatty fishes like mackerel and sardines. If you want to take supplemental omega-3 fatty acids, instead of fish oil, consider algae-derived omega-3 supplements which cost more, but may be better for you because they don't have the pollutants that are in most fish oil. 8) Eat lots more legumes which include kidney beans, black beans, garbanzo beans, black-eyed peas, fava beans, navy beans, white beans, broad beans, peanuts, green lentils, red lentils, tofu, miso, edamame, soybeans, tempeh, etc. 9) Eat lots more whole grains (oatmeal, brown rice, quinoa, buckwheat, barley, spelt, faro, bulgur  wheat, whole wheat or multigrain bread, whole wheat pasta, soba or buckwheat noodles etc.) and less processed grains and grain products (white rice, white bread, enriched  flour, fortified flour, cakes and pastries, products that say 'made with whole grains').  10) Try to eat enough whole plant foods, with enough dietary fiber to have at least one bowel movement a day, without stool softeners or laxatives.  11) If you become a vegan, (which means giving up meat, poultry, fish AND dairy products) consult your physician and take B 12 supplements (2500mcg once/week).    Supplements in Parkinson Disease Don't spend a lot of money on supplements, spend it on fresh healthy food; food is medicine! The dietary guidelines above are safe and effective for people with PD (and may reduce the risk of developing PD for people at higher risk). A brief note on mucuna: Mucuna pruriens is a legume that contains L-dopa. Some health food stores and online retailers sell mucuna extract in powdered or pill form, as an "alternative medicine" -- but it's not really alternative, it's just L-dopa! The amount of L-dopa found in mucuna is not standardized, and the extract may also contain fillers that can be at worst, directly toxic to the brain. I do not recommend taking mucuna instead of levodopa . A brief note on CBD: Cannabidiols (CBD) seems to be helpful for the management of stress and anxiety in Parkinson disease. On the downside, it might worsen apathy and lack of motivation. It does not seem to have an effect on the underlying disease course. Unfortunately, until it's federally legal, we will not be able to study it in research trials and figure out a standardized dosing strategy. If you choose to try CBD oil, try to find a strain that has minimal/no THC -- this will minimize the "high" of marijuana but will still lessen your anxiety

## 2024-01-06 ENCOUNTER — Ambulatory Visit: Payer: PPO | Admitting: Neurology

## 2024-01-20 DIAGNOSIS — M7130 Other bursal cyst, unspecified site: Secondary | ICD-10-CM | POA: Diagnosis not present

## 2024-01-31 ENCOUNTER — Other Ambulatory Visit: Payer: Self-pay | Admitting: Neurosurgery

## 2024-02-03 ENCOUNTER — Telehealth: Payer: Self-pay | Admitting: Neurology

## 2024-02-03 NOTE — Telephone Encounter (Signed)
 Pt. Needs to know if she can continue Rx's during and before surgery

## 2024-02-04 NOTE — Telephone Encounter (Signed)
 Called patient and gave answer to her question and she understood

## 2024-02-07 NOTE — Pre-Procedure Instructions (Signed)
 Surgical Instructions   Your procedure is scheduled on February 11, 2024. Report to [facility] Main Entrance A at 9:30 A.M., then check in with the Admitting office. Any questions or running late day of surgery: call [phone]  Questions prior to your surgery date: call [phone], Monday-Friday, 8am-4pm. If you experience any cold or flu symptoms such as cough, fever, chills, shortness of breath, etc. between now and your scheduled surgery, please notify us  at the above number.     Remember:  Do not eat or drink after midnight the night before your surgery    Take these medicines the morning of surgery with A SIP OF WATER: amantadine  (SYMMETREL )  Carbidopa -Levodopa  ER (RYTARY )    One week prior to surgery, STOP taking any Aspirin (unless otherwise instructed by your surgeon) Aleve, Naproxen, Ibuprofen, Motrin, Advil, Goody's, BC's, all herbal medications, fish oil, and non-prescription vitamins.                     Do NOT Smoke (Tobacco/Vaping) for 24 hours prior to your procedure.  If you use a CPAP at night, you may bring your mask/headgear for your overnight stay.   You will be asked to remove any contacts, glasses, piercing's, hearing aid's, dentures/partials prior to surgery. Please bring cases for these items if needed.    Patients discharged the day of surgery will not be allowed to drive home, and someone needs to stay with them for 24 hours.  SURGICAL WAITING ROOM VISITATION Patients may have no more than 2 support people in the waiting area - these visitors may rotate.   Pre-op nurse will coordinate an appropriate time for 1 ADULT support person, who may not rotate, to accompany patient in pre-op.  Children under the age of 17 must have an adult with them who is not the patient and must remain in the main waiting area with an adult.  If the patient needs to stay at the hospital during part of their recovery, the visitor guidelines for inpatient rooms  apply.  Please refer to the [facility] website for the visitor guidelines for any additional information.   If you received a COVID test during your pre-op visit  it is requested that you wear a mask when out in public, stay away from anyone that may not be feeling well and notify your surgeon if you develop symptoms. If you have been in contact with anyone that has tested positive in the last 10 days please notify you surgeon.      Pre-operative 5 CHG Bathing Instructions   You can play a key role in reducing the risk of infection after surgery. Your skin needs to be as free of germs as possible. You can reduce the number of germs on your skin by washing with CHG (chlorhexidine gluconate) soap before surgery. CHG is an antiseptic soap that kills germs and continues to kill germs even after washing.   DO NOT use if you have an allergy to chlorhexidine/CHG or antibacterial soaps. If your skin becomes reddened or irritated, stop using the CHG and notify one of our RNs at [phone].   Please shower with the CHG soap starting 4 days before surgery using the following schedule:     Please keep in mind the following:  DO NOT shave, including legs and underarms, starting the day of your first shower.   You may shave your face at any point before/day of surgery.  Place clean sheets on your bed the day  you start using CHG soap. Use a clean washcloth (not used since being washed) for each shower. DO NOT sleep with pets once you start using the CHG.   CHG Shower Instructions:  Wash your face and private area with normal soap. If you choose to wash your hair, wash first with your normal shampoo.  After you use shampoo/soap, rinse your hair and body thoroughly to remove shampoo/soap residue.  Turn the water OFF and apply about 3 tablespoons (45 ml) of CHG soap to a CLEAN washcloth.  Apply CHG soap ONLY FROM YOUR NECK DOWN TO YOUR TOES (washing for 3-5 minutes)  DO NOT use CHG soap on face,  private areas, open wounds, or sores.  Pay special attention to the area where your surgery is being performed.  If you are having back surgery, having someone wash your back for you may be helpful. Wait 2 minutes after CHG soap is applied, then you may rinse off the CHG soap.  Pat dry with a clean towel  Put on clean clothes/pajamas   If you choose to wear lotion, please use ONLY the CHG-compatible lotions that are listed below.  Additional instructions for the day of surgery: DO NOT APPLY any lotions, deodorants, cologne, or perfumes.   Do not bring valuables to the hospital. [facility] is not responsible for any belongings/valuables. Do not wear nail polish, gel polish, artificial nails, or any other type of covering on natural nails (fingers and toes) Do not wear jewelry or makeup Put on clean/comfortable clothes.  Please brush your teeth.  Ask your nurse before applying any prescription medications to the skin.     CHG Compatible Lotions   Aveeno Moisturizing lotion  Cetaphil Moisturizing Cream  Cetaphil Moisturizing Lotion  Clairol Herbal Essence Moisturizing Lotion, Dry Skin  Clairol Herbal Essence Moisturizing Lotion, Extra Dry Skin  Clairol Herbal Essence Moisturizing Lotion, Normal Skin  Curel Age Defying Therapeutic Moisturizing Lotion with Alpha Hydroxy  Curel Extreme Care Body Lotion  Curel Soothing Hands Moisturizing Hand Lotion  Curel Therapeutic Moisturizing Cream, Fragrance-Free  Curel Therapeutic Moisturizing Lotion, Fragrance-Free  Curel Therapeutic Moisturizing Lotion, Original Formula  Eucerin Daily Replenishing Lotion  Eucerin Dry Skin Therapy Plus Alpha Hydroxy Crme  Eucerin Dry Skin Therapy Plus Alpha Hydroxy Lotion  Eucerin Original Crme  Eucerin Original Lotion  Eucerin Plus Crme Eucerin Plus Lotion  Eucerin TriLipid Replenishing Lotion  Keri Anti-Bacterial Hand Lotion  Keri Deep Conditioning Original Lotion Dry Skin Formula Softly Scented   Keri Deep Conditioning Original Lotion, Fragrance Free Sensitive Skin Formula  Keri Lotion Fast Absorbing Fragrance Free Sensitive Skin Formula  Keri Lotion Fast Absorbing Softly Scented Dry Skin Formula  Keri Original Lotion  Keri Skin Renewal Lotion Keri Silky Smooth Lotion  Keri Silky Smooth Sensitive Skin Lotion  Nivea Body Creamy Conditioning Oil  Nivea Body Extra Enriched Lotion  Nivea Body Original Lotion  Nivea Body Sheer Moisturizing Lotion Nivea Crme  Nivea Skin Firming Lotion  NutraDerm 30 Skin Lotion  NutraDerm Skin Lotion  NutraDerm Therapeutic Skin Cream  NutraDerm Therapeutic Skin Lotion  ProShield Protective Hand Cream  Provon moisturizing lotion  Please read over the following fact sheets that you were given.

## 2024-02-08 ENCOUNTER — Encounter (HOSPITAL_COMMUNITY)
Admission: RE | Admit: 2024-02-08 | Discharge: 2024-02-08 | Disposition: A | Discharge status: Home / Self Care | Source: Ambulatory Visit | Attending: Neurosurgery | Admitting: Neurosurgery

## 2024-02-08 ENCOUNTER — Encounter (HOSPITAL_COMMUNITY): Payer: Self-pay

## 2024-02-08 ENCOUNTER — Other Ambulatory Visit: Payer: Self-pay

## 2024-02-08 VITALS — BP 119/64 | HR 89 | Temp 98.2°F | Resp 17 | Ht 63.0 in | Wt 135.4 lb

## 2024-02-08 DIAGNOSIS — Z01812 Encounter for preprocedural laboratory examination: Secondary | ICD-10-CM | POA: Insufficient documentation

## 2024-02-08 DIAGNOSIS — Z01818 Encounter for other preprocedural examination: Secondary | ICD-10-CM

## 2024-02-08 HISTORY — DX: Other specified postprocedural states: Z98.890

## 2024-02-08 HISTORY — DX: Other specified postprocedural states: R11.2

## 2024-02-08 LAB — COMPREHENSIVE METABOLIC PANEL WITH GFR
ALT: 6 U/L (ref 0–44)
AST: 19 U/L (ref 15–41)
Albumin: 3.6 g/dL (ref 3.5–5.0)
Alkaline Phosphatase: 84 U/L (ref 38–126)
Anion gap: 8 (ref 5–15)
BUN: 19 mg/dL (ref 8–23)
CO2: 25 mmol/L (ref 22–32)
Calcium: 8.6 mg/dL — ABNORMAL LOW (ref 8.9–10.3)
Chloride: 104 mmol/L (ref 98–111)
Creatinine, Ser: 0.9 mg/dL (ref 0.44–1.00)
GFR, Estimated: >60 mL/min (ref >60)
Glucose, Bld: 118 mg/dL — ABNORMAL HIGH (ref 70–99)
Potassium: 4.1 mmol/L (ref 3.5–5.1)
Sodium: 137 mmol/L (ref 135–145)
Total Bilirubin: 0.7 mg/dL (ref 0.0–1.2)
Total Protein: 6.4 g/dL — ABNORMAL LOW (ref 6.5–8.1)

## 2024-02-08 LAB — CBC
HCT: 39 % (ref 36.0–46.0)
Hemoglobin: 12.7 g/dL (ref 12.0–15.0)
MCH: 29.8 pg (ref 26.0–34.0)
MCHC: 32.6 g/dL (ref 30.0–36.0)
MCV: 91.5 fL (ref 80.0–100.0)
Platelets: 239 K/uL (ref 150–400)
RBC: 4.26 MIL/uL (ref 3.87–5.11)
RDW: 12.8 % (ref 11.5–15.5)
WBC: 5.6 K/uL (ref 4.0–10.5)
nRBC: 0 % (ref 0.0–0.2)

## 2024-02-08 LAB — SURGICAL PCR SCREEN
MRSA, PCR: NEGATIVE
Staphylococcus aureus: POSITIVE — AB

## 2024-02-08 NOTE — Progress Notes (Signed)
 PCP - Garnette Gleason Cardiologist -  Neurology -Tat, Asberry RAMAN, DO   PPM/ICD - denies Device Orders - n/a Rep Notified - n/a  Chest x-ray - denies EKG - denies Stress Test - denies ECHO - denies Cardiac Cath - denies  Sleep Study - denies CPAP - n/a  Dm -denies   Blood Thinner Instructions:denies Aspirin Instructions:n/a  ERAS Protcol - NPO   COVID TEST-    Anesthesia review: no  Patient denies shortness of breath, fever, cough and chest pain at PAT appointment   All instructions explained to the patient, with a verbal understanding of the material. Patient agrees to go over the instructions while at home for a better understanding. Patient also instructed to self quarantine after being tested for COVID-19. The opportunity to ask questions was provided.

## 2024-02-11 ENCOUNTER — Ambulatory Visit (HOSPITAL_COMMUNITY): Payer: Self-pay | Admitting: Anesthesiology

## 2024-02-11 ENCOUNTER — Ambulatory Visit (HOSPITAL_COMMUNITY)

## 2024-02-11 ENCOUNTER — Encounter (HOSPITAL_COMMUNITY): Payer: Self-pay | Admitting: Anesthesiology

## 2024-02-11 ENCOUNTER — Encounter (HOSPITAL_COMMUNITY)
Admission: RE | Disposition: A | Discharge status: Home / Self Care | Payer: Self-pay | Source: Home / Self Care | Attending: Neurosurgery

## 2024-02-11 ENCOUNTER — Other Ambulatory Visit: Payer: Self-pay

## 2024-02-11 ENCOUNTER — Ambulatory Visit (HOSPITAL_COMMUNITY)
Admission: RE | Admit: 2024-02-11 | Discharge: 2024-02-12 | Disposition: A | Discharge status: Home / Self Care | Attending: Neurosurgery | Admitting: Neurosurgery

## 2024-02-11 DIAGNOSIS — Z981 Arthrodesis status: Secondary | ICD-10-CM | POA: Diagnosis not present

## 2024-02-11 DIAGNOSIS — E785 Hyperlipidemia, unspecified: Secondary | ICD-10-CM | POA: Diagnosis not present

## 2024-02-11 DIAGNOSIS — M7138 Other bursal cyst, other site: Secondary | ICD-10-CM | POA: Diagnosis not present

## 2024-02-11 DIAGNOSIS — R569 Unspecified convulsions: Secondary | ICD-10-CM | POA: Diagnosis not present

## 2024-02-11 DIAGNOSIS — M1990 Unspecified osteoarthritis, unspecified site: Secondary | ICD-10-CM | POA: Diagnosis not present

## 2024-02-11 DIAGNOSIS — M5416 Radiculopathy, lumbar region: Secondary | ICD-10-CM | POA: Insufficient documentation

## 2024-02-11 DIAGNOSIS — G20A1 Parkinson's disease without dyskinesia, without mention of fluctuations: Secondary | ICD-10-CM | POA: Insufficient documentation

## 2024-02-11 DIAGNOSIS — M7130 Other bursal cyst, unspecified site: Secondary | ICD-10-CM | POA: Diagnosis present

## 2024-02-11 HISTORY — PX: LUMBAR LAMINECTOMY/DECOMPRESSION MICRODISCECTOMY: SHX5026

## 2024-02-11 SURGERY — LUMBAR LAMINECTOMY/DECOMPRESSION MICRODISCECTOMY 1 LEVEL
Anesthesia: General | Site: Back | Laterality: Left

## 2024-02-11 MED ORDER — CHLORHEXIDINE GLUCONATE CLOTH 2 % EX PADS: 6.0000 | MEDICATED_PAD | Freq: Once | CUTANEOUS | Status: DC

## 2024-02-11 MED ORDER — AMANTADINE HCL 100 MG PO CAPS
100.0000 mg | ORAL_CAPSULE | Freq: Three times a day (TID) | ORAL | Status: DC
  Filled 2024-02-11 (×4): qty 1

## 2024-02-11 MED ORDER — ONDANSETRON HCL 4 MG/2ML IJ SOLN
INTRAMUSCULAR | Status: AC
  Filled 2024-02-11: qty 2

## 2024-02-11 MED ORDER — BUPIVACAINE HCL (PF) 0.25 % IJ SOLN
INTRAMUSCULAR | Status: DC | PRN
  Administered 2024-02-11: 10 mL

## 2024-02-11 MED ORDER — ORAL CARE MOUTH RINSE: 15.0000 mL | Freq: Once | OROMUCOSAL | Status: AC

## 2024-02-11 MED ORDER — OXYCODONE HCL 5 MG PO TABS: 5.0000 mg | ORAL_TABLET | Freq: Once | ORAL | Status: DC | PRN

## 2024-02-11 MED ORDER — ROCURONIUM BROMIDE 10 MG/ML (PF) SYRINGE
PREFILLED_SYRINGE | INTRAVENOUS | Status: AC
  Filled 2024-02-11: qty 10

## 2024-02-11 MED ORDER — THROMBIN 5000 UNITS EX KIT
PACK | CUTANEOUS | Status: AC
  Filled 2024-02-11: qty 2

## 2024-02-11 MED ORDER — CHLORHEXIDINE GLUCONATE 0.12 % MT SOLN: 15.0000 mL | Freq: Once | OROMUCOSAL | Status: AC

## 2024-02-11 MED ORDER — LIDOCAINE 2% (20 MG/ML) 5 ML SYRINGE
INTRAMUSCULAR | Status: DC | PRN
  Administered 2024-02-11: 50 mg via INTRAVENOUS

## 2024-02-11 MED ORDER — ROCURONIUM BROMIDE 10 MG/ML (PF) SYRINGE
PREFILLED_SYRINGE | INTRAVENOUS | Status: DC | PRN
  Administered 2024-02-11: 50 mg via INTRAVENOUS

## 2024-02-11 MED ORDER — MENTHOL 3 MG MT LOZG: 1.0000 | LOZENGE | OROMUCOSAL | Status: DC | PRN

## 2024-02-11 MED ORDER — CHLORHEXIDINE GLUCONATE 0.12 % MT SOLN
OROMUCOSAL | Status: AC
  Administered 2024-02-11: 15 mL via OROMUCOSAL
  Filled 2024-02-11: qty 15

## 2024-02-11 MED ORDER — ACETAMINOPHEN 325 MG PO TABS: 650.0000 mg | ORAL_TABLET | ORAL | Status: DC | PRN

## 2024-02-11 MED ORDER — METOCLOPRAMIDE HCL 5 MG/ML IJ SOLN: 5.0000 mg | Freq: Four times a day (QID) | INTRAMUSCULAR | Status: DC | PRN

## 2024-02-11 MED ORDER — SODIUM CHLORIDE 0.9 % IV SOLN
250.0000 mL | INTRAVENOUS | Status: DC
  Administered 2024-02-11: 250 mL via INTRAVENOUS

## 2024-02-11 MED ORDER — MUPIROCIN 2 % EX OINT: 1.0000 | TOPICAL_OINTMENT | Freq: Two times a day (BID) | CUTANEOUS | 0 refills | Status: AC

## 2024-02-11 MED ORDER — CEFAZOLIN SODIUM-DEXTROSE 2-4 GM/100ML-% IV SOLN
2.0000 g | INTRAVENOUS | Status: AC
  Administered 2024-02-11: 2 g via INTRAVENOUS

## 2024-02-11 MED ORDER — MEPERIDINE HCL 25 MG/ML IJ SOLN: 6.2500 mg | INTRAMUSCULAR | Status: DC | PRN

## 2024-02-11 MED ORDER — METOCLOPRAMIDE HCL 5 MG PO TABS
5.0000 mg | ORAL_TABLET | Freq: Four times a day (QID) | ORAL | Status: DC | PRN
  Filled 2024-02-11: qty 2

## 2024-02-11 MED ORDER — AMISULPRIDE (ANTIEMETIC) 5 MG/2ML IV SOLN
10.0000 mg | Freq: Once | INTRAVENOUS | Status: AC | PRN
  Administered 2024-02-11: 10 mg via INTRAVENOUS

## 2024-02-11 MED ORDER — 0.9 % SODIUM CHLORIDE (POUR BTL) OPTIME
TOPICAL | Status: DC | PRN
  Administered 2024-02-11: 1000 mL

## 2024-02-11 MED ORDER — PHENOL 1.4 % MT LIQD: 1.0000 | OROMUCOSAL | Status: DC | PRN

## 2024-02-11 MED ORDER — PROPOFOL 10 MG/ML IV BOLUS
INTRAVENOUS | Status: AC
  Filled 2024-02-11: qty 20

## 2024-02-11 MED ORDER — THROMBIN (RECOMBINANT) 5000 UNITS EX SOLR
CUTANEOUS | Status: AC
  Filled 2024-02-11: qty 10000

## 2024-02-11 MED ORDER — CHLORHEXIDINE GLUCONATE CLOTH 2 % EX PADS
6.0000 | MEDICATED_PAD | Freq: Every day | CUTANEOUS | Status: DC
  Administered 2024-02-11 – 2024-02-12 (×2): 6 via TOPICAL

## 2024-02-11 MED ORDER — CHLORHEXIDINE GLUCONATE 4 % EX SOLN: 1.0000 | CUTANEOUS | 1 refills | Status: AC

## 2024-02-11 MED ORDER — THROMBIN (RECOMBINANT) 5000 UNITS EX SOLR
CUTANEOUS | Status: DC | PRN
  Administered 2024-02-11: 10 mL via TOPICAL

## 2024-02-11 MED ORDER — ONDANSETRON HCL 4 MG/2ML IJ SOLN: 4.0000 mg | Freq: Once | INTRAMUSCULAR | Status: DC | PRN

## 2024-02-11 MED ORDER — LIDOCAINE 2% (20 MG/ML) 5 ML SYRINGE
INTRAMUSCULAR | Status: AC
  Filled 2024-02-11: qty 5

## 2024-02-11 MED ORDER — SODIUM CHLORIDE 0.9% FLUSH
3.0000 mL | INTRAVENOUS | Status: DC | PRN
Start: 2024-02-11 — End: 2024-02-12

## 2024-02-11 MED ORDER — HYDROCODONE-ACETAMINOPHEN 5-325 MG PO TABS
2.0000 | ORAL_TABLET | ORAL | Status: DC | PRN
  Administered 2024-02-11 – 2024-02-12 (×2): 1 via ORAL
  Filled 2024-02-11 (×2): qty 2

## 2024-02-11 MED ORDER — AMISULPRIDE (ANTIEMETIC) 5 MG/2ML IV SOLN
INTRAVENOUS | Status: AC
  Filled 2024-02-11: qty 4

## 2024-02-11 MED ORDER — FENTANYL CITRATE (PF) 100 MCG/2ML IJ SOLN: 25.0000 ug | INTRAMUSCULAR | Status: DC | PRN

## 2024-02-11 MED ORDER — ACETAMINOPHEN 650 MG RE SUPP: 650.0000 mg | RECTAL | Status: DC | PRN

## 2024-02-11 MED ORDER — LIDOCAINE-EPINEPHRINE 1 %-1:100000 IJ SOLN
INTRAMUSCULAR | Status: AC
  Filled 2024-02-11: qty 1

## 2024-02-11 MED ORDER — CEFAZOLIN SODIUM-DEXTROSE 2-4 GM/100ML-% IV SOLN
INTRAVENOUS | Status: AC
  Filled 2024-02-11: qty 100

## 2024-02-11 MED ORDER — HYDROMORPHONE HCL 1 MG/ML IJ SOLN: 0.5000 mg | INTRAMUSCULAR | Status: DC | PRN

## 2024-02-11 MED ORDER — MUPIROCIN 2 % EX OINT
1.0000 | TOPICAL_OINTMENT | Freq: Two times a day (BID) | CUTANEOUS | Status: DC
  Administered 2024-02-11 – 2024-02-12 (×3): 1 via NASAL
  Filled 2024-02-11: qty 22

## 2024-02-11 MED ORDER — LIDOCAINE-EPINEPHRINE 1 %-1:100000 IJ SOLN
INTRAMUSCULAR | Status: DC | PRN
Start: 2024-02-11 — End: 2024-02-11
  Administered 2024-02-11: 10 mL

## 2024-02-11 MED ORDER — ONDANSETRON HCL 4 MG PO TABS: 4.0000 mg | ORAL_TABLET | Freq: Four times a day (QID) | ORAL | Status: DC | PRN

## 2024-02-11 MED ORDER — PANTOPRAZOLE SODIUM 40 MG IV SOLR
40.0000 mg | Freq: Every day | INTRAVENOUS | Status: DC
  Administered 2024-02-11: 40 mg via INTRAVENOUS
  Filled 2024-02-11: qty 10

## 2024-02-11 MED ORDER — CYCLOBENZAPRINE HCL 10 MG PO TABS
10.0000 mg | ORAL_TABLET | Freq: Three times a day (TID) | ORAL | Status: DC | PRN
Start: 2024-02-11 — End: 2024-02-12

## 2024-02-11 MED ORDER — BUPIVACAINE HCL (PF) 0.25 % IJ SOLN
INTRAMUSCULAR | Status: AC
  Filled 2024-02-11: qty 30

## 2024-02-11 MED ORDER — FENTANYL CITRATE (PF) 250 MCG/5ML IJ SOLN
INTRAMUSCULAR | Status: AC
  Filled 2024-02-11: qty 5

## 2024-02-11 MED ORDER — OXYCODONE HCL 5 MG/5ML PO SOLN: 5.0000 mg | Freq: Once | ORAL | Status: DC | PRN

## 2024-02-11 MED ORDER — FENTANYL CITRATE (PF) 250 MCG/5ML IJ SOLN
INTRAMUSCULAR | Status: DC | PRN
  Administered 2024-02-11: 150 ug via INTRAVENOUS

## 2024-02-11 MED ORDER — CEFAZOLIN SODIUM-DEXTROSE 2-4 GM/100ML-% IV SOLN
2.0000 g | Freq: Three times a day (TID) | INTRAVENOUS | Status: AC
  Administered 2024-02-11 (×2): 2 g via INTRAVENOUS
  Filled 2024-02-11 (×2): qty 100

## 2024-02-11 MED ORDER — SODIUM CHLORIDE 0.9% FLUSH
3.0000 mL | Freq: Two times a day (BID) | INTRAVENOUS | Status: DC
  Administered 2024-02-11: 3 mL via INTRAVENOUS

## 2024-02-11 MED ORDER — ONDANSETRON HCL 4 MG/2ML IJ SOLN
INTRAMUSCULAR | Status: DC | PRN
  Administered 2024-02-11: 4 mg via INTRAVENOUS

## 2024-02-11 MED ORDER — LACTATED RINGERS IV SOLN: INTRAVENOUS | Status: DC

## 2024-02-11 MED ORDER — ONDANSETRON HCL 4 MG/2ML IJ SOLN: 4.0000 mg | Freq: Four times a day (QID) | INTRAMUSCULAR | Status: DC | PRN

## 2024-02-11 MED ORDER — CARBIDOPA-LEVODOPA ER 61.25-245 MG PO CPCR: 245.0000 mg | ORAL_CAPSULE | Freq: Three times a day (TID) | ORAL | Status: DC

## 2024-02-11 MED ORDER — ALUM & MAG HYDROXIDE-SIMETH 200-200-20 MG/5ML PO SUSP: 30.0000 mL | Freq: Four times a day (QID) | ORAL | Status: DC | PRN

## 2024-02-11 MED ORDER — PROPOFOL 10 MG/ML IV BOLUS
INTRAVENOUS | Status: DC | PRN
  Administered 2024-02-11: 100 mg via INTRAVENOUS

## 2024-02-11 MED ORDER — SUGAMMADEX SODIUM 200 MG/2ML IV SOLN
INTRAVENOUS | Status: DC | PRN
  Administered 2024-02-11: 200 mg via INTRAVENOUS

## 2024-02-11 SURGICAL SUPPLY — 43 items
BAG COUNTER SPONGE SURGICOUNT (BAG) ×1 IMPLANT
BAND RUBBER #18 3X1/16 STRL (MISCELLANEOUS) ×2 IMPLANT
BENZOIN TINCTURE PRP APPL 2/3 (GAUZE/BANDAGES/DRESSINGS) ×1 IMPLANT
BLADE CLIPPER SURG (BLADE) IMPLANT
BLADE SURG 11 STRL SS (BLADE) ×1 IMPLANT
BUR CUTTER 7.0 ROUND (BURR) ×1 IMPLANT
BUR MATCHSTICK NEURO 3.0 LAGG (BURR) ×1 IMPLANT
CANISTER SUCTION 3000ML PPV (SUCTIONS) ×1 IMPLANT
DERMABOND ADVANCED .7 DNX12 (GAUZE/BANDAGES/DRESSINGS) ×1 IMPLANT
DRAPE HALF SHEET 40X57 (DRAPES) IMPLANT
DRAPE LAPAROTOMY 100X72X124 (DRAPES) ×1 IMPLANT
DRAPE MICROSCOPE SLANT 54X150 (MISCELLANEOUS) ×1 IMPLANT
DRAPE SURG 17X23 STRL (DRAPES) ×1 IMPLANT
DRSG OPSITE POSTOP 4X6 (GAUZE/BANDAGES/DRESSINGS) IMPLANT
DURAPREP 26ML APPLICATOR (WOUND CARE) ×1 IMPLANT
ELECTRODE REM PT RTRN 9FT ADLT (ELECTROSURGICAL) ×1 IMPLANT
GAUZE 4X4 16PLY ~~LOC~~+RFID DBL (SPONGE) IMPLANT
GAUZE SPONGE 4X4 12PLY STRL (GAUZE/BANDAGES/DRESSINGS) ×1 IMPLANT
GLOVE BIO SURGEON STRL SZ7 (GLOVE) IMPLANT
GLOVE BIO SURGEON STRL SZ8 (GLOVE) ×1 IMPLANT
GLOVE BIOGEL PI IND STRL 7.0 (GLOVE) IMPLANT
GLOVE EXAM NITRILE XL STR (GLOVE) IMPLANT
GLOVE INDICATOR 8.5 STRL (GLOVE) ×2 IMPLANT
GOWN STRL REUS W/ TWL LRG LVL3 (GOWN DISPOSABLE) ×1 IMPLANT
GOWN STRL REUS W/ TWL XL LVL3 (GOWN DISPOSABLE) ×2 IMPLANT
GOWN STRL REUS W/TWL 2XL LVL3 (GOWN DISPOSABLE) IMPLANT
KIT BASIN OR (CUSTOM PROCEDURE TRAY) ×1 IMPLANT
KIT TURNOVER KIT B (KITS) ×1 IMPLANT
NDL HYPO 22X1.5 SAFETY MO (MISCELLANEOUS) ×1 IMPLANT
NDL SPNL 22GX3.5 QUINCKE BK (NEEDLE) ×1 IMPLANT
NEEDLE HYPO 22X1.5 SAFETY MO (MISCELLANEOUS) ×1 IMPLANT
NEEDLE SPNL 22GX3.5 QUINCKE BK (NEEDLE) ×1 IMPLANT
NS IRRIG 1000ML POUR BTL (IV SOLUTION) ×1 IMPLANT
PACK LAMINECTOMY NEURO (CUSTOM PROCEDURE TRAY) ×1 IMPLANT
SPIKE FLUID TRANSFER (MISCELLANEOUS) ×1 IMPLANT
SPONGE SURGIFOAM ABS GEL SZ50 (HEMOSTASIS) ×1 IMPLANT
STRIP CLOSURE SKIN 1/2X4 (GAUZE/BANDAGES/DRESSINGS) ×1 IMPLANT
SUT VIC AB 0 CT1 18XCR BRD8 (SUTURE) ×1 IMPLANT
SUT VIC AB 2-0 CT1 18 (SUTURE) ×1 IMPLANT
SUT VIC AB 4-0 PS2 18 (SUTURE) ×1 IMPLANT
TOWEL GREEN STERILE (TOWEL DISPOSABLE) ×1 IMPLANT
TOWEL GREEN STERILE FF (TOWEL DISPOSABLE) ×1 IMPLANT
WATER STERILE IRR 1000ML POUR (IV SOLUTION) ×1 IMPLANT

## 2024-02-11 NOTE — Transfer of Care (Signed)
 Immediate Anesthesia Transfer of Care Note  Patient: Insurance claims handler  Procedure(s) Performed: Laminectomy for facet/synovial cyst - Lumbar Four-Lumbar Five - left (Left: Back)  Patient Location: PACU  Anesthesia Type:General  Level of Consciousness: drowsy and patient cooperative  Airway & Oxygen Therapy: Patient Spontanous Breathing and Patient connected to nasal cannula oxygen  Post-op Assessment: Report given to RN and Post -op Vital signs reviewed and stable  Post vital signs: Reviewed and stable  Last Vitals:  Vitals Value Taken Time  BP 87/71 02/11/24 13:15  Temp    Pulse 72 02/11/24 13:16  Resp 18 02/11/24 13:16  SpO2 94 % 02/11/24 13:16  Vitals shown include unfiled device data.  Last Pain:  Vitals:   02/11/24 0952  PainSc: 0-No pain         Complications: No notable events documented.

## 2024-02-11 NOTE — Op Note (Signed)
 Preoperative diagnosis: Left-sided synovial cyst L4-5 and left L5 radiculopathy.  Postop diagnosis: Same.  Procedure: Lumbar laminectomy L4-5 with partial facetectomy and foraminotomies for resection of left-sided synovial cyst with microdissection and microscopic resection of the cyst.  Surgeon: Arley helling.  Assistant: Suzen Click.  Anesthesia: General  EBL: Minimal.  HPI: 73 year old female progressive worsening back and left leg pain rating down L5 distribution workup revealed large synovial cyst on the left at L4-5 causing severe thecal sac compression and left L5 nerve root compression.  Due to the patient's progression of clinical syndrome imaging findings of failed conservative treatment I recommended laminectomy and microscopic resection of the cyst.  I extensively reviewed the risks and benefits of the operation with the patient as well as perioperative course expectations of outcome and alternatives to surgery and she understood and agreed to proceed forward.SABRA  Operative procedure: Patient was brought into the OR was just under general esthesia positioned prone the Wilson frame her back was prepped and draped in routine sterile fashion.  Preoperative x-ray localized the appropriate level so after infiltration of 10 cc lidocaine  with epi midline incision was made and Bovie electrocautery was used to take down the subcutaneous tissue and subperiosteal dissection was carried lamina of L4 and L5 interoperative x-ray confirmed application of the L4 pedicle.  So laminotomy was begun inferior to this the inferior two thirds of the L4 lamina was removed superior one third of L5 partial facetectomy was performed and then under microscopic illumination the cyst was immediately identified densely stuck to the thecal sac and dissected off the thecal sac and resected the cyst which was significantly degenerated removed a lot of spurs that were medially projecting off the facet joint.  The lateral  identification of the L5 nerve root which was skeletonized and unroofed decompressed in the foramen.  Confirmed complete resection of the cyst and its membrane which resulted in wide decompression of thecal sac and L5 nerve root.  The wound was then copiously irrigated meticulous hemostasis was maintained Gelfoam was ON top of the dura and the muscle and fascia approximate layers with interrupted Vicryl and the skin was closed with a running 4 subcuticular.  Dermabond benzoin Steri-Strips and a sterile dressing was applied patient to cover him in stable condition.  At the end the case all needle counts and sponge counts were correct.

## 2024-02-11 NOTE — H&P (Signed)
 Chelsea Conley is an 73 y.o. female.   Chief Complaint: Back and left leg pain HPI: 73 year old female with severe back and left leg pain rating down L5 distribution workup has revealed a large synovial cyst L4-5 in the left displacing thecal sac and causing severe neural foraminal stenosis.  Due to patient's progression of clinical syndrome imaging findings of a conservative treatment I recommended laminectomy for resection of synovial cyst.  I extensively reviewed the risks and benefits of the operation with the patient as well as perioperative course expectations of outcome and alternatives to surgery and she understood and agreed to proceed forward.  Past Medical History:  Diagnosis Date   Abnormal levels of other serum enzymes 08/23/2023   Abnormal liver function 08/23/2023   Arthritis of carpometacarpal (CMC) joint of right thumb 12/21/2018   Cervical dystonia    Chronic idiopathic constipation    Dysphagia    Eustachian tube dysfunction, left 08/18/2021   History of colonic polyps    Hyperlipidemia 05/15/2015   Insomnia    Parkinson's disease 05/15/2015   PONV (postoperative nausea and vomiting)    REM sleep behaviors, mild    Seizures 2007   2007 last seizure   Synovial cyst    Tremor 05/15/2015    Past Surgical History:  Procedure Laterality Date   FOOT SURGERY Bilateral    HAND TENDON SURGERY Right    per patient   MOUTH SURGERY     X2   TONSILLECTOMY      Family History  Problem Relation Age of Onset   Parkinson's disease Mother    Diabetes Father    Diabetes Sister    Diabetes Brother    Parkinson's disease Maternal Grandfather    Social History:  reports that she has never smoked. She has never used smokeless tobacco. She reports that she does not drink alcohol and does not use drugs.  Allergies: No Known Allergies  Medications Prior to Admission  Medication Sig Dispense Refill   amantadine  (SYMMETREL ) 100 MG capsule Take 1 capsule (100 mg total) by  mouth 3 (three) times daily. 270 capsule 2   Carbidopa -Levodopa  ER (RYTARY ) 61.25-245 MG CPCR Take 245 mg by mouth 3 (three) times daily. 8am/noon/4pm/bed 360 capsule 1    No results found for this or any previous visit (from the past 48 hours). No results found.  Review of Systems  Musculoskeletal:  Positive for back pain.  Neurological:  Positive for numbness.    Blood pressure (!) 144/80, pulse 92, temperature 98.2 F (36.8 C), resp. rate 18, height 5' 3 (1.6 m), weight 61.2 kg, SpO2 97%. Physical Exam HENT:     Head: Normocephalic.     Right Ear: Tympanic membrane normal.     Nose: Nose normal.     Mouth/Throat:     Mouth: Mucous membranes are moist.  Cardiovascular:     Rate and Rhythm: Normal rate.     Pulses: Normal pulses.  Pulmonary:     Effort: Pulmonary effort is normal.  Abdominal:     General: Abdomen is flat.  Musculoskeletal:     Cervical back: Normal range of motion.  Skin:    General: Skin is warm.  Neurological:     Mental Status: She is alert.     Comments: Patient is awake and alert strength is 5-5 iliopsoas, quads, hamstrings, gastrocs, tibialis, and EHL.      Assessment/Plan 73 year old presents for left sided L4-5 laminectomy for synovial cyst  Arley SHAUNNA Helling, MD 02/11/2024, 10:49  AM

## 2024-02-11 NOTE — Anesthesia Procedure Notes (Signed)
 Procedure Name: Intubation Date/Time: 02/11/2024 11:50 AM  Performed by: Zelphia Norleen HERO, CRNAPre-anesthesia Checklist: Patient identified, Emergency Drugs available, Suction available and Patient being monitored Patient Re-evaluated:Patient Re-evaluated prior to induction Oxygen Delivery Method: Circle system utilized Preoxygenation: Pre-oxygenation with 100% oxygen Induction Type: IV induction Ventilation: Mask ventilation without difficulty Laryngoscope Size: Mac and 3 Grade View: Grade II Tube type: Oral Tube size: 7.0 mm Number of attempts: 1 Airway Equipment and Method: Stylet and Oral airway Placement Confirmation: ETT inserted through vocal cords under direct vision, positive ETCO2 and breath sounds checked- equal and bilateral Secured at: 22 cm Tube secured with: Tape Dental Injury: Teeth and Oropharynx as per pre-operative assessment

## 2024-02-11 NOTE — Anesthesia Postprocedure Evaluation (Signed)
 Anesthesia Post Note  Patient: Baily Soo  Procedure(s) Performed: Laminectomy for facet/synovial cyst - Lumbar Four-Lumbar Five - left (Left: Back)     Patient location during evaluation: PACU Anesthesia Type: General Level of consciousness: awake and alert Pain management: pain level controlled Vital Signs Assessment: post-procedure vital signs reviewed and stable Respiratory status: spontaneous breathing, nonlabored ventilation, respiratory function stable and patient connected to nasal cannula oxygen Cardiovascular status: blood pressure returned to baseline and stable Postop Assessment: no apparent nausea or vomiting Anesthetic complications: no   No notable events documented.  Last Vitals:  Vitals:   02/11/24 1400 02/11/24 1426  BP: (!) 141/69 (!) 147/63  Pulse: 72 88  Resp: 17 18  Temp: 36.4 C 36.6 C  SpO2: 95% 97%    Last Pain:  Vitals:   02/11/24 1345  PainSc: 0-No pain                 Sayre Mazor

## 2024-02-11 NOTE — Anesthesia Preprocedure Evaluation (Addendum)
 Anesthesia Evaluation  Patient identified by MRN, date of birth, ID band Patient awake    Reviewed: Allergy & Precautions, H&P , NPO status , Patient's Chart, lab work & pertinent test results  History of Anesthesia Complications (+) PONV and history of anesthetic complications  Airway Mallampati: II  TM Distance: >3 FB Neck ROM: Full    Dental no notable dental hx. (+) Teeth Intact, Dental Advisory Given, Missing   Pulmonary neg pulmonary ROS   Pulmonary exam normal breath sounds clear to auscultation       Cardiovascular Exercise Tolerance: Good negative cardio ROS Normal cardiovascular exam Rhythm:Regular Rate:Normal     Neuro/Psych Seizures -, Well Controlled,  negative neurological ROS  negative psych ROS   GI/Hepatic negative GI ROS, Neg liver ROS,,,  Endo/Other  negative endocrine ROS    Renal/GU negative Renal ROS  negative genitourinary   Musculoskeletal negative musculoskeletal ROS (+) Arthritis , Osteoarthritis,    Abdominal   Peds negative pediatric ROS (+)  Hematology negative hematology ROS (+)   Anesthesia Other Findings   Reproductive/Obstetrics negative OB ROS                              Anesthesia Physical Anesthesia Plan  ASA: 3  Anesthesia Plan: General   Post-op Pain Management: Ofirmev IV (intra-op)* and Precedex   Induction: Intravenous  PONV Risk Score and Plan: 4 or greater and Ondansetron, Dexamethasone and Treatment may vary due to age or medical condition  Airway Management Planned: Oral ETT  Additional Equipment: None  Intra-op Plan:   Post-operative Plan: Extubation in OR  Informed Consent: I have reviewed the patients History and Physical, chart, labs and discussed the procedure including the risks, benefits and alternatives for the proposed anesthesia with the patient or authorized representative who has indicated his/her understanding and  acceptance.       Plan Discussed with: Anesthesiologist and CRNA  Anesthesia Plan Comments: (  )         Anesthesia Quick Evaluation

## 2024-02-12 ENCOUNTER — Encounter (HOSPITAL_COMMUNITY): Payer: Self-pay | Admitting: Neurosurgery

## 2024-02-12 DIAGNOSIS — M7138 Other bursal cyst, other site: Secondary | ICD-10-CM | POA: Diagnosis not present

## 2024-02-12 MED ORDER — CYCLOBENZAPRINE HCL 5 MG PO TABS
5.0000 mg | ORAL_TABLET | Freq: Three times a day (TID) | ORAL | 1 refills | Status: AC | PRN
Start: 2024-02-12 — End: ?

## 2024-02-12 MED ORDER — HYDROCODONE-ACETAMINOPHEN 5-325 MG PO TABS: 1.0000 | ORAL_TABLET | ORAL | 0 refills | Status: AC | PRN

## 2024-02-12 NOTE — Discharge Summary (Signed)
 Physician Discharge Summary  Patient ID: Chelsea Conley MRN: [id number] DOB/AGE: [birth year] 73 y.o.  Admit date: 02/11/2024 Discharge date: 02/12/2024  Admission Diagnoses: Lumbar synovial cyst with radiculopathy    Discharge Diagnoses: Same   Discharged Condition: Good  Hospital Course: The patient was admitted on 02/11/2024 and taken to the operating room where the patient underwent lumbar laminectomy for resection of synovial cyst. The patient tolerated the procedure well and was taken to the recovery room and then to the floor in stable condition. The hospital course was routine. There were no complications. The wound remained clean dry and intact. Pt had appropriate back soreness. No complaints of leg pain or new N/T/W. The patient remained afebrile with stable vital signs, and tolerated a regular diet. The patient continued to increase activities, and pain was well controlled with oral pain medications.   Consults: None  Significant Diagnostic Studies:  Results for orders placed or performed during the hospital encounter of 02/08/24  Surgical pcr screen   Collection Time: 02/08/24  1:19 PM   Specimen: Nasal Mucosa; Nasal Swab  Result Value Ref Range   MRSA, PCR NEGATIVE NEGATIVE   Staphylococcus aureus POSITIVE (A) NEGATIVE  Comprehensive metabolic panel per protocol   Collection Time: 02/08/24  2:00 PM  Result Value Ref Range   Sodium 137 135 - 145 mmol/L   Potassium 4.1 3.5 - 5.1 mmol/L   Chloride 104 98 - 111 mmol/L   CO2 25 22 - 32 mmol/L   Glucose, Bld 118 (H) 70 - 99 mg/dL   BUN 19 8 - 23 mg/dL   Creatinine, Ser 9.09 0.44 - 1.00 mg/dL   Calcium 8.6 (L) 8.9 - 10.3 mg/dL   Total Protein 6.4 (L) 6.5 - 8.1 g/dL   Albumin 3.6 3.5 - 5.0 g/dL   AST 19 15 - 41 U/L   ALT 6 0 - 44 U/L   Alkaline Phosphatase 84 38 - 126 U/L   Total Bilirubin 0.7 0.0 - 1.2 mg/dL   GFR, Estimated >39 >39 mL/min   Anion gap 8 5 - 15  CBC per protocol   Collection Time: 02/08/24  2:00 PM   Result Value Ref Range   WBC 5.6 4.0 - 10.5 K/uL   RBC 4.26 3.87 - 5.11 MIL/uL   Hemoglobin 12.7 12.0 - 15.0 g/dL   HCT 60.9 63.9 - 53.9 %   MCV 91.5 80.0 - 100.0 fL   MCH 29.8 26.0 - 34.0 pg   MCHC 32.6 30.0 - 36.0 g/dL   RDW 87.1 88.4 - 84.4 %   Platelets 239 150 - 400 K/uL   nRBC 0.0 0.0 - 0.2 %    DG Lumbar Spine 1 View Result Date: 02/11/2024 CLINICAL DATA:  L4-5 laminectomy. EXAM: LUMBAR SPINE - 1 VIEW COMPARISON:  MRI of April 22, 2023. FINDINGS: Two intraoperative cross-table lateral projections were obtained of the lumbar spine. The first image demonstrates surgical probe directed toward the posterior interspinous space of L4-5. The second image demonstrates surgical probe directed toward posterior elements of L4. IMPRESSION: Surgical localization as noted above. Electronically Signed   By: Lynwood Landy Raddle M.D.   On: 02/11/2024 15:30    Antibiotics:  Anti-infectives (From admission, onward)    Start     Dose/Rate Route Frequency Ordered Stop   02/11/24 1515  ceFAZolin  (ANCEF ) IVPB 2g/100 mL premix        2 g 200 mL/hr over 30 Minutes Intravenous Every 8 hours 02/11/24 1418 02/11/24 2252   02/11/24  0945  ceFAZolin  (ANCEF ) IVPB 2g/100 mL premix        2 g 200 mL/hr over 30 Minutes Intravenous On call to O.R. 02/11/24 0938 02/11/24 1200   02/11/24 0939  ceFAZolin  (ANCEF ) 2-4 GM/100ML-% IVPB       Note to Pharmacy: Ezequiel Henri: cabinet override      02/11/24 0939 02/11/24 1207       Discharge Exam: Blood pressure (!) 110/54, pulse 77, temperature 97.9 F (36.6 C), temperature source Oral, resp. rate 19, height 5' 3 (1.6 m), weight 61.2 kg, SpO2 97%. Neurologic: Grossly normal with manifestations of Parkinson's disease with slow shuffling gait and resting tremor Dressing dry  Discharge Medications:   Allergies as of 02/12/2024   No Known Allergies      Medication List     TAKE these medications    amantadine  100 MG capsule Commonly known as:  SYMMETREL  Take 1 capsule (100 mg total) by mouth 3 (three) times daily.   chlorhexidine  4 % external liquid Commonly known as: HIBICLENS  Apply 15 mLs (1 Application total) topically as directed for 30 doses. Use as directed daily for 5 days every other week for 6 weeks.   cyclobenzaprine  5 MG tablet Commonly known as: FLEXERIL  Take 1 tablet (5 mg total) by mouth 3 (three) times daily as needed for muscle spasms.   HYDROcodone -acetaminophen  5-325 MG tablet Commonly known as: NORCO/VICODIN Take 1 tablet by mouth every 4 (four) hours as needed for severe pain (pain score 7-10).   mupirocin  ointment 2 % Commonly known as: BACTROBAN  Place 1 Application into the nose 2 (two) times daily for 60 doses. Use as directed 2 times daily for 5 days every other week for 6 weeks.   Rytary  61.25-245 MG Cpcr Generic drug: Carbidopa -Levodopa  ER Take 245 mg by mouth 3 (three) times daily. 8am/noon/4pm/bed        Disposition: home   Final Dx: laminectomy for synovial cyst  Discharge Instructions      Remove dressing in 72 hours   Complete by: As directed    Call MD for:  difficulty breathing, headache or visual disturbances   Complete by: As directed    Call MD for:  persistant nausea and vomiting   Complete by: As directed    Call MD for:  redness, tenderness, or signs of infection (pain, swelling, redness, odor or green/yellow discharge around incision site)   Complete by: As directed    Call MD for:  severe uncontrolled pain   Complete by: As directed    Call MD for:  temperature >100.4   Complete by: As directed    Diet - low sodium heart healthy   Complete by: As directed    Increase activity slowly   Complete by: As directed           Signed: Alm GORMAN Molt 02/12/2024, 7:34 AM

## 2024-02-12 NOTE — Evaluation (Addendum)
 Occupational Therapy Evaluation Patient Details Name: Chelsea Conley MRN: [id number] DOB: [birth year] Today's Date: 02/12/2024   History of Present Illness   Pt is a 73 y.o. female s/p L4-5 laminectomy for facet/synonial cyst. PMH significant for arthritis, PD, HLD, cervical dystonia, dysphagia, hx seizures, tremor.     Clinical Impressions PTA, pt lived with her spouse and reports being independent. Upon eval, pt performing UB ADL with mod i and LB ADL with supervision. Pt educated and demonstrating use of compensatory techniques for bed mobility, LB ADL, grooming, toileting, and shower transfers within precautions. Additionally reviewed mobility/activity recommendations. All education provided and questions answered. Recommending discharge home with no OT follow up at this time. OT to sign off. Please re-consult if change in status.        If plan is discharge home, recommend the following:   A little help with walking and/or transfers;A little help with bathing/dressing/bathroom;Assistance with cooking/housework;Assist for transportation;Help with stairs or ramp for entrance     Functional Status Assessment   Patient has had a recent decline in their functional status and demonstrates the ability to make significant improvements in function in a reasonable and predictable amount of time.     Equipment Recommendations   None recommended by OT     Recommendations for Other Services         Precautions/Restrictions   Precautions Precautions: Back Precaution Booklet Issued: Yes (comment) Recall of Precautions/Restrictions: Intact Precaution/Restrictions Comments: all precautions reviewed within the context of ADL Required Braces or Orthoses:  (no brace needed orders)     Mobility Bed Mobility               General bed mobility comments: OOB in chair; reviewed log roll technique    Transfers Overall transfer level: Needs assistance Equipment used: Rolling  walker (2 wheels) Transfers: Sit to/from Stand Sit to Stand: Supervision           General transfer comment: one cue for hand placment      Balance Overall balance assessment: Mild deficits observed, not formally tested (slow, festinating gait at times consistent with PD, no overt LOB)                                         ADL either performed or assessed with clinical judgement   ADL Overall ADL's : Needs assistance/impaired Eating/Feeding: Independent   Grooming: Supervision/safety;Standing   Upper Body Bathing: Independent   Lower Body Bathing: Supervison/ safety;Sit to/from stand   Upper Body Dressing : Modified independent;Sitting   Lower Body Dressing: Supervision/safety;Sit to/from stand Lower Body Dressing Details (indicate cue type and reason): one cue for hand placement during STS Toilet Transfer: Supervision/safety;Ambulation;Rolling walker (2 wheels)     Toileting - Clothing Manipulation Details (indicate cue type and reason): reviewed compensatory techniques Tub/ Shower Transfer: Ambulation;Rolling walker (2 wheels);Supervision/safety   Functional mobility during ADLs: Supervision/safety;Rolling walker (2 wheels) (or CGA without RW)       Vision Baseline Vision/History: 1 Wears glasses Ability to See in Adequate Light: 0 Adequate Patient Visual Report: No change from baseline Vision Assessment?: No apparent visual deficits     Perception         Praxis         Pertinent Vitals/Pain Pain Assessment Pain Assessment: Faces Faces Pain Scale: Hurts a little bit Pain Location: back Pain Descriptors / Indicators: Aching Pain Intervention(s): Limited activity within  patient's tolerance, Monitored during session     Extremity/Trunk Assessment Upper Extremity Assessment Upper Extremity Assessment: Left hand dominant (mild tremor due to PD)   Lower Extremity Assessment Lower Extremity Assessment: Overall WFL for tasks assessed    Cervical / Trunk Assessment Cervical / Trunk Assessment: Back Surgery   Communication Communication Communication: No apparent difficulties Factors Affecting Communication:  (soft spoken)   Cognition Arousal: Alert Behavior During Therapy: WFL for tasks assessed/performed Cognition: No apparent impairments             OT - Cognition Comments: some decr ability to multitask.executive function but suspect baseline consistent with PD                 Following commands: Intact       Cueing  General Comments   Cueing Techniques: Verbal cues      Exercises     Shoulder Instructions      Home Living Family/patient expects to be discharged to:: Private residence Living Arrangements: Spouse/significant other Available Help at Discharge: Family Type of Home: House Home Access: Stairs to enter [employer] of Steps: threshold into door   Home Layout: One level     Bathroom Shower/Tub: Arts development officer Toilet: Handicapped height     Home Equipment: Agricultural consultant (2 wheels);Cane - single point;Shower seat - built in;Grab bars - tub/shower          Prior Functioning/Environment Prior Level of Function : Independent/Modified Independent;Driving             Mobility Comments: intermittent use of AD when pain exacerbated      OT Problem List: Decreased activity tolerance;Impaired balance (sitting and/or standing);Decreased knowledge of use of DME or AE;Decreased knowledge of precautions;Decreased safety awareness   OT Treatment/Interventions:        OT Goals(Current goals can be found in the care plan section)   Acute Rehab OT Goals Patient Stated Goal: get better OT Goal Formulation: With patient   OT Frequency:       Co-evaluation              AM-PAC OT 6 Clicks Daily Activity     Outcome Measure Help from another person eating meals?: None Help from another person taking care of personal grooming?: A  Little Help from another person toileting, which includes using toliet, bedpan, or urinal?: A Little Help from another person bathing (including washing, rinsing, drying)?: A Little Help from another person to put on and taking off regular upper body clothing?: None Help from another person to put on and taking off regular lower body clothing?: A Little 6 Click Score: 20   End of Session Equipment Utilized During Treatment: Gait belt;Rolling walker (2 wheels) Nurse Communication: Mobility status  Activity Tolerance: Patient tolerated treatment well Patient left: in chair;with call bell/phone within reach  OT Visit Diagnosis: Unsteadiness on feet (R26.81);Muscle weakness (generalized) (M62.81);Pain Pain - part of body:  (back)                Time: 9255-9196 OT Time Calculation (min): 19 min Charges:  OT General Charges $OT Visit: 1 Visit OT Evaluation $OT Eval Low Complexity: 1 Low  Elma JONETTA Lebron FREDERICK, OTR/L [facility] Acute Rehabilitation Office: [phone]   Elma JONETTA Lebron 02/12/2024, 9:12 AM

## 2024-02-12 NOTE — Progress Notes (Signed)
 Patient alert and oriented, surgical site clean and dry no sign of infection. D/c instructions explain and given to the patient and family. All questions answered.

## 2024-02-15 MED FILL — Thrombin For Soln 5000 Unit: CUTANEOUS | Qty: 2 | Status: AC

## 2024-03-29 ENCOUNTER — Telehealth: Payer: Self-pay | Admitting: Neurology

## 2024-03-29 NOTE — Telephone Encounter (Signed)
 Pt would like a call back to discuss Rx change

## 2024-03-30 ENCOUNTER — Other Ambulatory Visit: Payer: Self-pay

## 2024-03-30 DIAGNOSIS — G20B1 Parkinson's disease with dyskinesia, without mention of fluctuations: Secondary | ICD-10-CM

## 2024-03-30 MED ORDER — RYTARY 61.25-245 MG PO CPCR: ORAL_CAPSULE | ORAL | 0 refills | Status: DC

## 2024-03-30 NOTE — Telephone Encounter (Signed)
 Called patient discussed meds and sent in new RX

## 2024-03-30 NOTE — Telephone Encounter (Signed)
 Patient is calling back to discuss medicine change. Dr. Evonnie changed her meds and she is to call and let her know if its worked.

## 2024-04-12 ENCOUNTER — Encounter: Payer: Self-pay | Admitting: Physical Therapy

## 2024-04-12 ENCOUNTER — Ambulatory Visit: Attending: Neurosurgery | Admitting: Physical Therapy

## 2024-04-12 ENCOUNTER — Other Ambulatory Visit: Payer: Self-pay

## 2024-04-12 DIAGNOSIS — M5459 Other low back pain: Secondary | ICD-10-CM | POA: Insufficient documentation

## 2024-04-12 DIAGNOSIS — M6281 Muscle weakness (generalized): Secondary | ICD-10-CM | POA: Diagnosis not present

## 2024-04-12 DIAGNOSIS — R262 Difficulty in walking, not elsewhere classified: Secondary | ICD-10-CM | POA: Insufficient documentation

## 2024-04-12 NOTE — Therapy (Signed)
 OUTPATIENT PHYSICAL THERAPY THORACOLUMBAR EVALUATION   Patient Name: Chelsea Conley MRN: [id number] DOB:Oct 13, 1950, 73 y.o., female Today's Date: 04/12/2024  END OF SESSION:  PT End of Session - 04/12/24 1239     Visit Number 1    Date for Recertification  07/05/24    Authorization Type [employer]    PT Start Time 1235    PT Stop Time 1315    PT Time Calculation (min) 40 min    Activity Tolerance Patient tolerated treatment well          Past Medical History:  Diagnosis Date   Abnormal levels of other serum enzymes 08/23/2023   Abnormal liver function 08/23/2023   Arthritis of carpometacarpal (CMC) joint of right thumb 12/21/2018   Cervical dystonia    Chronic idiopathic constipation    Dysphagia    Eustachian tube dysfunction, left 08/18/2021   History of colonic polyps    Hyperlipidemia 05/15/2015   Insomnia    Parkinson's disease 05/15/2015   PONV (postoperative nausea and vomiting)    REM sleep behaviors, mild    Seizures 2007   2007 last seizure   Synovial cyst    Tremor 05/15/2015   Past Surgical History:  Procedure Laterality Date   FOOT SURGERY Bilateral    HAND TENDON SURGERY Right    per patient   LUMBAR LAMINECTOMY/DECOMPRESSION MICRODISCECTOMY Left 02/11/2024   Procedure: Laminectomy for facet/synovial cyst - Lumbar Four-Lumbar Five - left;  Surgeon: Onetha Kuba, MD;  Location: [facility] OR;  Service: Neurosurgery;  Laterality: Left;  Laminectomy for facet/synovial cyst - L4-L5 - left   MOUTH SURGERY     X2   TONSILLECTOMY     Patient Active Problem List   Diagnosis Date Noted   Synovial cyst of lumbar facet joint 02/11/2024   Chronic idiopathic constipation    History of colonic polyps    Synovial cyst    Cervical dystonia    Dysphagia    REM sleep behaviors, mild    Insomnia    Eustachian tube dysfunction, left 08/18/2021   Arthritis of carpometacarpal [facility]) joint of right thumb 12/21/2018   Hyperlipidemia 05/15/2015   Tremor 05/15/2015    Parkinson's disease 05/15/2015   Seizures 2007    PCP: Nanci Senior MD  REFERRING PROVIDER: Onetha Kuba MD  REFERRING DIAG: M71.30 synovial cyst  Rationale for Evaluation and Treatment: Rehabilitation  THERAPY DIAG:  Other low back pain  Muscle weakness (generalized)  ONSET DATE: > 1 year  SUBJECTIVE:  SUBJECTIVE STATEMENT: A little over a year ago began having back pain and then left LE radiating pain to the ankle. Was doing [employer] for Parkinsons but had to stop.   Tried steroid injections, DN did not improve.  Had surgery 8/1.  Did well then 2 weeks ago tried to lift a 10# bag of rice and worsened for 3 days but improving now; states she sometimes she uses a cane but not using currently (mainly b/c of parkinsons) Current walking capacity: errands for 1-2 hours, walk for 20 min but hasn't started a regular program Used home stationary bike 1x (has triangle seat)  02/11/24 lumbar lami L4-5 decompression/microdiscectomy   PERTINENT HISTORY:  Parkinson's, seizures, cervical dystonia   PAIN:   Are you having pain? Yes NPRS scale: waist left > right 3/10 Pain location: left low back radiating down to ankle; this last time across the back and down to left knee Pain orientation: Bilateral  PAIN TYPE: aching Pain description: intermittent  Aggravating factors: lifting; bending, rising sit to stand; difficulty vacuuming or making the bed Relieving factors: sitting with legs curled    PRECAUTIONS: post-surgical no bending, no twisting   WEIGHT BEARING RESTRICTIONS: No  FALLS:  Has patient fallen in last 6 months? 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   PATIENT SURVEYS:  Modified Oswestry:  MODIFIED OSWESTRY DISABILITY SCALE  Date: 04/12/24 Score  Pain intensity 0 = I can tolerate the pain I have without having to use pain medication.  2. Personal care (washing, dressing, etc.) 0 =  I can take care of myself normally without causing increased pain.  3. Lifting 3 = Pain prevents me from lifting heavy weights, but I can manage (5) I have hardly any social life because of my pain. light to medium weights if they are conveniently positioned  4. Walking 2 =  Pain prevents me from walking more than  mile.  5. Sitting 0 =  I can sit in any chair as long as I like.  6. Standing 2 =  Pain prevents me from standing more than 1 hour  7. Sleeping 0 = Pain does not prevent me from sleeping well.  8. Social Life 0 = My social life is normal and does not increase my pain.  9. Traveling 1 =  I can travel anywhere, but it increases my pain.  10. Employment/ Homemaking 2 = I can perform most of my homemaking/job duties, but pain prevents me from performing more physically stressful activities (eg, lifting, vacuuming).  Total 20%   Interpretation of scores: Score Category Description  0-20% Minimal Disability The patient can cope with most living activities. Usually no treatment is indicated apart from advice on lifting, sitting and exercise  21-40% Moderate Disability The patient experiences more pain and difficulty with sitting, lifting and standing. Travel and social life are more difficult and they may be disabled from work. Personal care, sexual activity and sleeping are not grossly affected, and the patient can  usually be managed by conservative means  41-60% Severe Disability Pain  remains the main problem in this group, but activities of daily living are affected. These patients require a detailed investigation  61-80% Crippled Back pain impinges on all aspects of the patient's life. Positive intervention is required  81-100% Bed-bound  These patients are either bed-bound or exaggerating their symptoms  Bluford FORBES Zoe DELENA Karon DELENA, et al. Surgery versus conservative management of stable thoracolumbar fracture: the PRESTO feasibility RCT. [place] ([place]): [employer]; 2021 Nov. [facility] Technology Assessment, No. 25.62.) Appendix 3, Oswestry Disability Index category descriptors. Available from: FindJewelers.cz  Minimally Clinically Important Difference (MCID) = 12.8%  COGNITION: Overall cognitive status: Within functional limits for tasks assessed     POSTURE: increased thoracic kyphosis Small lumbar incision appears well healed   LUMBAR ROM: not formally tested secondary post surgical precautions  TRUNK STRENGTH:  Decreased activation of transverse abdominus muscles; abdominals 4-/5; decreased activation of lumbar multifidi; trunk extensors 4-/5; SLS 1-2 sec right/left with compensatory pelvic drop and trunk lean   LOWER EXTREMITY ROM:   able to do seated figure 4 position for putting on socks/shoes but stiffer on left vs. right  LOWER EXTREMITY MMT:  able to rise sit to stand without UE assist although knee valgus bil   MMT Right eval Left eval  Hip flexion 5 4+  Hip extension 5 4  Hip abduction 5 4  Hip adduction    Hip internal rotation    Hip external rotation 5 4  Knee flexion 5 4  Knee extension 5 4  Ankle dorsiflexion 5 4  Ankle plantarflexion 5 4  Ankle inversion    Ankle eversion     (Blank rows = not tested)  LUMBAR SPECIAL TESTS:  Sciatic Neural tension left > right in supine  FUNCTIONAL TESTS:  5x STS 24.30 Tug 15.87 6  MWT 802 feet Able to do a squat to retrieve small item from the floor (increased knee valgus)   GAIT: Comments: decreased arm swing; progressive trunk flexion  TREATMENT DATE: 04/12/24 evaluation                                                                                                                                 PATIENT EDUCATION:  Education details: Educated patient on anatomy and physiology of current symptoms, prognosis, plan of care as well as initial self care strategies to promote recovery Person educated: Patient Education method: Explanation Education comprehension: verbalized understanding  HOME EXERCISE PROGRAM: Access Code: [email or web address] URL: [email or web address] Date: 04/12/2024 Prepared by: Glade Pesa  Exercises - Supine Single Knee to Chest Stretch  - 1 x daily - 7 x weekly - 1 sets - 5 reps - 20 hold - Supine Sciatic Nerve Glide  - 1 x daily - 7 x weekly - 1 sets - 10 reps - Sit to Stand  - 1 x daily - 7 x weekly - 1 sets - 10 reps - Standing Heel Raise with Support  - 1 x  daily - 7 x weekly - 1 sets - 10 reps - Supine Transversus Abdominis Bracing - Hands on Stomach  - 1 x daily - 7 x weekly - 1 sets - 10 reps - 5 hold  ASSESSMENT:  CLINICAL IMPRESSION: Patient is a 73 y.o. female who was seen today for physical therapy evaluation and treatment for s/p lumbar lami decompression, microdiscectomy and cyst removal on 02/11/24. She reports an improvement of left LE symptoms which previously radiated to her ankle, now pain stops at the knee.  She reports pain intensity is much improved however she did have a flare up when she bent to pick up a 10 pound bag of rice 2 weeks ago. She reports functional limitations with lifting, bending, rising sit to stand; difficulty vacuuming or making the bed.  She is unable to do her Parkinsons boxing class. The patient would benefit from PT to address strength asymmetries in lumbo/pelvic and hip regions and pain  levels that are currently affecting activities of daily living.  OBJECTIVE IMPAIRMENTS: decreased activity tolerance, decreased mobility, difficulty walking, decreased strength, impaired perceived functional ability, postural dysfunction, and pain.   ACTIVITY LIMITATIONS: carrying, lifting, bending, standing, squatting, stairs, bed mobility, and locomotion level  PARTICIPATION LIMITATIONS: meal prep, cleaning, laundry, shopping, and community activity  PERSONAL FACTORS: time since onset, Parkinson's are also affecting patient's functional outcome.   REHAB POTENTIAL: Good  CLINICAL DECISION MAKING: low/minimal complexity  EVALUATION COMPLEXITY: Low   GOALS: Goals reviewed with patient? Yes  SHORT TERM GOALS: Target date: 05/24/2024      The patient will demonstrate knowledge of basic self care strategies and exercises to promote healing  Baseline: Goal status: INITIAL  2.  The patient will have improved Timed Up and Go (TUG) time to  14     sec indicating improved gait speed and LE strength  Baseline:  Goal status: INITIAL  3.  Improved LE strength for rising sit to stand with 5x STS time improved to 19 sec Baseline:  Goal status: INITIAL  4.  The patient will have improved gait stamina and speed needed to ambulate 900 feet in 6 minutes  Baseline:  Goal status: INITIAL  5.  The patient will report a 40% improvement in pain levels with functional activities which are currently difficult including bending, lifting, sit to stand Baseline:  Goal status: INITIAL   LONG TERM GOALS: Target date: 07/05/2024     The patient will be independent in a safe self progression of a home exercise program to promote further recovery of function   Baseline:  Goal status: INITIAL  2.  The patient will report a 70% improvement in pain levels with functional activities which are currently difficult including bending, lifting, sit to stand Baseline:  Goal status: INITIAL  3.  The  patient will have improved trunk flexor and extensor muscle strength to at least 4+/5 needed for lifting medium weight objects such as grocery bags  Baseline:  Goal status: INITIAL  4.  The patient will have improved LE strength to at least 4+/5 needed for vacuuming and making the bed Baseline:  Goal status: INITIAL  5.  Modified Oswestry functional outcome measure score improved to   12  % indicating improved function with ADLS with less pain.   Baseline:  Goal status: INITIAL   PLAN:  PT FREQUENCY: 1x/week (2x recommended however pt states she is unable to drive due to Parkinson's and her husband has numerous appts as well)  PT DURATION: 12 weeks  PLANNED INTERVENTIONS: 97164- PT Re-evaluation, 97750- Physical Performance Testing, 97110-Therapeutic exercises, 97530- Therapeutic activity, [id number]- Neuromuscular re-education, 97535- Self Care, 02859- Manual therapy, [phone]- Aquatic Therapy, [phone]- Electrical stimulation (unattended), Patient/Family education, Taping, Spinal mobilization, Scar mobilization, Cryotherapy, and Moist heat.  PLAN FOR NEXT SESSION: review initial HEP; encourage a walking program; lumbo/pelvic/hip core strengthening gradual progression; Nu-step for spinal muscle conditioning  Glade Pesa, PT 04/12/24 8:47 PM Phone: [phone] Fax: [phone]

## 2024-04-18 NOTE — Therapy (Incomplete)
 OUTPATIENT PHYSICAL THERAPY THORACOLUMBAR TREATMENT   Patient Name: Chelsea Conley MRN: [id number] DOB:[birth year], 73 y.o., female Today's Date: 04/18/2024  END OF SESSION:    Past Medical History:  Diagnosis Date   Abnormal levels of other serum enzymes 08/23/2023   Abnormal liver function 08/23/2023   Arthritis of carpometacarpal (CMC) joint of right thumb 12/21/2018   Cervical dystonia    Chronic idiopathic constipation    Dysphagia    Eustachian tube dysfunction, left 08/18/2021   History of colonic polyps    Hyperlipidemia 05/15/2015   Insomnia    Parkinson's disease 05/15/2015   PONV (postoperative nausea and vomiting)    REM sleep behaviors, mild    Seizures 2007   2007 last seizure   Synovial cyst    Tremor 05/15/2015   Past Surgical History:  Procedure Laterality Date   FOOT SURGERY Bilateral    HAND TENDON SURGERY Right    per patient   LUMBAR LAMINECTOMY/DECOMPRESSION MICRODISCECTOMY Left 02/11/2024   Procedure: Laminectomy for facet/synovial cyst - Lumbar Four-Lumbar Five - left;  Surgeon: Onetha Kuba, MD;  Location: [facility] OR;  Service: Neurosurgery;  Laterality: Left;  Laminectomy for facet/synovial cyst - L4-L5 - left   MOUTH SURGERY     X2   TONSILLECTOMY     Patient Active Problem List   Diagnosis Date Noted   Synovial cyst of lumbar facet joint 02/11/2024   Chronic idiopathic constipation    History of colonic polyps    Synovial cyst    Cervical dystonia    Dysphagia    REM sleep behaviors, mild    Insomnia    Eustachian tube dysfunction, left 08/18/2021   Arthritis of carpometacarpal [facility]) joint of right thumb 12/21/2018   Hyperlipidemia 05/15/2015   Tremor 05/15/2015   Parkinson's disease 05/15/2015   Seizures 2007    PCP: Nanci Senior MD  REFERRING PROVIDER: Onetha Kuba MD  REFERRING DIAG: M71.30 synovial cyst  Rationale for Evaluation and Treatment: Rehabilitation  THERAPY DIAG:  No diagnosis found.  ONSET DATE: > 1  year  SUBJECTIVE:                                                                                                                                                                                           SUBJECTIVE STATEMENT: ***  Current walking capacity: errands for 1-2 hours, walk for 20 min but hasn't started a regular program Used home stationary bike 1x (has triangle seat)  02/11/24 lumbar lami L4-5 decompression/microdiscectomy   PERTINENT HISTORY:  Parkinson's, seizures, cervical dystonia   PAIN:   Are you having pain? Yes NPRS scale: waist left > right 3/10  Pain location: left low back radiating down to ankle; this last time across the back and down to left knee Pain orientation: Bilateral  PAIN TYPE: aching Pain description: intermittent  Aggravating factors: lifting; bending, rising sit to stand; difficulty vacuuming or making the bed Relieving factors: sitting with legs curled    PRECAUTIONS: post-surgical no bending, no twisting   WEIGHT BEARING RESTRICTIONS: No  FALLS:  Has patient fallen in last 6 months? 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   PATIENT SURVEYS:  Modified Oswestry:  MODIFIED OSWESTRY DISABILITY SCALE  Date: 04/12/24 Score  Pain intensity 0 = I can tolerate the pain I have without having to use pain medication.  2. Personal care (washing, dressing, etc.) 0 =  I can take care of myself normally without causing increased pain.  3. Lifting 3 = Pain prevents me from lifting heavy  weights, but I can manage (5) I have hardly any social life because of my pain. light to medium weights if they are conveniently positioned  4. Walking 2 =  Pain prevents me from walking more than  mile.  5. Sitting 0 =  I can sit in any chair as long as I like.  6. Standing 2 =  Pain prevents me from standing more than 1 hour  7. Sleeping 0 = Pain does not prevent me from sleeping well.  8. Social Life 0 = My social life is normal and does not increase my pain.  9. Traveling 1 =  I can travel anywhere, but it increases my pain.  10. Employment/ Homemaking 2 = I can perform most of my homemaking/job duties, but pain prevents me from performing more physically stressful activities (eg, lifting, vacuuming).  Total 20%   Interpretation of scores: Score Category Description  0-20% Minimal Disability The patient can cope with most living activities. Usually no treatment is indicated apart from advice on lifting, sitting and exercise  21-40% Moderate Disability The patient experiences more pain and difficulty with sitting, lifting and standing. Travel and social life are more difficult and they may be disabled from work. Personal care, sexual activity and sleeping are not grossly affected, and the patient can usually be managed by conservative means  41-60% Severe Disability Pain remains the main problem in this group, but activities of daily living are affected. These patients require a detailed investigation  61-80% Crippled Back pain impinges on all aspects of the patient's life. Positive intervention is required  81-100% Bed-bound  These patients are either bed-bound or exaggerating their symptoms  Bluford FORBES Zoe DELENA Karon DELENA, et al. Surgery versus conservative management of stable thoracolumbar fracture: the PRESTO feasibility RCT. [place] ([place]): [employer]; 2021 Nov. [facility] Technology Assessment, No. 25.62.) Appendix 3, Oswestry Disability Index category descriptors. Available  from: FindJewelers.cz  Minimally Clinically Important Difference (MCID) = 12.8%  COGNITION: Overall cognitive status: Within functional limits for tasks assessed     POSTURE: increased thoracic kyphosis Small lumbar incision appears well healed   LUMBAR ROM: not formally tested secondary post surgical precautions  TRUNK STRENGTH:  Decreased activation of transverse abdominus muscles; abdominals 4-/5; decreased activation of lumbar multifidi; trunk extensors 4-/5; SLS 1-2 sec right/left with compensatory pelvic drop and trunk lean   LOWER EXTREMITY ROM:   able to do seated figure 4 position for putting on socks/shoes but stiffer on left vs. right  LOWER EXTREMITY MMT:  able to rise sit to stand without UE assist although knee valgus bil   MMT Right eval Left eval  Hip flexion 5 4+  Hip extension 5 4  Hip abduction 5 4  Hip adduction    Hip internal rotation    Hip external rotation 5 4  Knee flexion 5 4  Knee extension 5 4  Ankle dorsiflexion 5 4  Ankle plantarflexion 5 4  Ankle inversion    Ankle eversion     (Blank rows = not tested)  LUMBAR SPECIAL TESTS:  Sciatic Neural tension left > right in supine  FUNCTIONAL TESTS:  5x STS 24.30 Tug 15.87 6 MWT 802 feet Able to do a squat to retrieve small item from the floor (increased knee valgus)   GAIT: Comments: decreased arm swing; progressive trunk flexion  TREATMENT DATE:  04/19/24    04/12/24 evaluation                                                                                                                                 PATIENT EDUCATION:  Education details: Educated patient on anatomy and physiology of current symptoms, prognosis, plan of care as well as initial self care strategies to promote recovery Person educated: Patient Education method: Explanation Education comprehension: verbalized understanding  HOME EXERCISE PROGRAM: Access Code: [email or web address] URL:  [email or web address] Date: 04/12/2024 Prepared by: Glade Pesa  Exercises - Supine Single Knee to Chest Stretch  - 1 x daily - 7 x weekly - 1 sets - 5 reps - 20 hold - Supine Sciatic Nerve Glide  - 1 x daily - 7 x weekly - 1 sets - 10 reps - Sit to Stand  - 1 x daily - 7 x weekly - 1 sets - 10 reps - Standing Heel Raise with Support  - 1 x daily - 7 x weekly - 1 sets - 10 reps - Supine Transversus Abdominis Bracing - Hands on Stomach  - 1 x daily - 7 x weekly - 1 sets - 10 reps - 5 hold  ASSESSMENT:  CLINICAL IMPRESSION: ***  OBJECTIVE IMPAIRMENTS: decreased activity tolerance, decreased mobility, difficulty walking, decreased strength, impaired perceived functional ability, postural dysfunction, and pain.   ACTIVITY LIMITATIONS: carrying, lifting, bending, standing, squatting, stairs, bed mobility, and locomotion level  PARTICIPATION LIMITATIONS: meal prep, cleaning, laundry, shopping, and community activity  PERSONAL FACTORS: time since onset, Parkinson's are also affecting patient's functional outcome.   REHAB POTENTIAL: Good  CLINICAL DECISION MAKING: low/minimal complexity  EVALUATION COMPLEXITY: Low   GOALS: Goals reviewed with patient? Yes  SHORT TERM GOALS: Target date: 05/24/2024  The patient will demonstrate knowledge of basic self care strategies and exercises to promote healing  Baseline: Goal status: INITIAL  2.  The patient will have improved Timed Up and Go (TUG) time to  14     sec indicating improved gait speed and LE strength  Baseline:  Goal status: INITIAL  3.  Improved LE strength for rising sit to stand with 5x STS time improved to 19 sec Baseline:  Goal status: INITIAL  4.  The patient will have improved gait stamina and speed needed to ambulate 900 feet in 6 minutes  Baseline:  Goal status: INITIAL  5.  The patient will report a 40% improvement in pain levels with functional activities which are currently difficult  including bending, lifting, sit to stand Baseline:  Goal status: INITIAL   LONG TERM GOALS: Target date: 07/05/2024     The patient will be independent in a safe self progression of a home exercise program to promote further recovery of function   Baseline:  Goal status: INITIAL  2.  The patient will report a 70% improvement in pain levels with functional activities which are currently difficult including bending, lifting, sit to stand Baseline:  Goal status: INITIAL  3.  The patient will have improved trunk flexor and extensor muscle strength to at least 4+/5 needed for lifting medium weight objects such as grocery bags  Baseline:  Goal status: INITIAL  4.  The patient will have improved LE strength to at least 4+/5 needed for vacuuming and making the bed Baseline:  Goal status: INITIAL  5.  Modified Oswestry functional outcome measure score improved to   12  % indicating improved function with ADLS with less pain.   Baseline:  Goal status: INITIAL   PLAN:  PT FREQUENCY: 1x/week (2x recommended however pt states she is unable to drive due to Parkinson's and her husband has numerous appts as well)  PT DURATION: 12 weeks  PLANNED INTERVENTIONS: 97164- PT Re-evaluation, 97750- Physical Performance Testing, 97110-Therapeutic exercises, 97530- Therapeutic activity, 97112- Neuromuscular re-education, 97535- Self Care, 02859- Manual therapy, [id number]- Aquatic Therapy, [id number]- Electrical stimulation (unattended), Patient/Family education, Taping, Spinal mobilization, Scar mobilization, Cryotherapy, and Moist heat.  PLAN FOR NEXT SESSION: review initial HEP; encourage a walking program; lumbo/pelvic/hip core strengthening gradual progression; Nu-step for spinal muscle conditioning  Mliss Cummins, PT  04/18/24 8:17 PM Phone: [phone] Fax: [phone]

## 2024-04-19 ENCOUNTER — Ambulatory Visit: Admitting: Physical Therapy

## 2024-04-19 ENCOUNTER — Encounter: Payer: Self-pay | Admitting: Physical Therapy

## 2024-04-19 DIAGNOSIS — M5459 Other low back pain: Secondary | ICD-10-CM | POA: Diagnosis not present

## 2024-04-19 DIAGNOSIS — R262 Difficulty in walking, not elsewhere classified: Secondary | ICD-10-CM

## 2024-04-19 DIAGNOSIS — M6281 Muscle weakness (generalized): Secondary | ICD-10-CM

## 2024-04-19 NOTE — Therapy (Signed)
 OUTPATIENT PHYSICAL THERAPY THORACOLUMBAR PROGRESS NOTE   Patient Name: Chelsea Conley MRN: [id number] DOB:01/22/1951, 73 y.o., female Today's Date: 04/19/2024  END OF SESSION:  PT End of Session - 04/19/24 1234     Visit Number 2    Date for Recertification  07/05/24    Authorization Type [employer]    Progress Note Due on Visit 10    PT Start Time 1232    PT Stop Time 1315    PT Time Calculation (min) 43 min    Activity Tolerance Patient tolerated treatment well          Past Medical History:  Diagnosis Date   Abnormal levels of other serum enzymes 08/23/2023   Abnormal liver function 08/23/2023   Arthritis of carpometacarpal (CMC) joint of right thumb 12/21/2018   Cervical dystonia    Chronic idiopathic constipation    Dysphagia    Eustachian tube dysfunction, left 08/18/2021   History of colonic polyps    Hyperlipidemia 05/15/2015   Insomnia    Parkinson's disease 05/15/2015   PONV (postoperative nausea and vomiting)    REM sleep behaviors, mild    Seizures 2007   2007 last seizure   Synovial cyst    Tremor 05/15/2015   Past Surgical History:  Procedure Laterality Date   FOOT SURGERY Bilateral    HAND TENDON SURGERY Right    per patient   LUMBAR LAMINECTOMY/DECOMPRESSION MICRODISCECTOMY Left 02/11/2024   Procedure: Laminectomy for facet/synovial cyst - Lumbar Four-Lumbar Five - left;  Surgeon: Onetha Kuba, MD;  Location: [facility] OR;  Service: Neurosurgery;  Laterality: Left;  Laminectomy for facet/synovial cyst - L4-L5 - left   MOUTH SURGERY     X2   TONSILLECTOMY     Patient Active Problem List   Diagnosis Date Noted   Synovial cyst of lumbar facet joint 02/11/2024   Chronic idiopathic constipation    History of colonic polyps    Synovial cyst    Cervical dystonia    Dysphagia    REM sleep behaviors, mild    Insomnia    Eustachian tube dysfunction, left 08/18/2021   Arthritis of carpometacarpal [facility]) joint of right thumb 12/21/2018    Hyperlipidemia 05/15/2015   Tremor 05/15/2015   Parkinson's disease 05/15/2015   Seizures 2007    PCP: Nanci Senior MD  REFERRING PROVIDER: Onetha Kuba MD  REFERRING DIAG: M71.30 synovial cyst  Rationale for Evaluation and Treatment: Rehabilitation  THERAPY DIAG:  Other low back pain  Muscle weakness (generalized)  Difficulty in walking, not elsewhere classified  ONSET DATE: > 1 year  SUBJECTIVE:  SUBJECTIVE STATEMENT: Walked for 2 hours over the weekend at a festival in [place].  Tired but not in pain.  Pain has been low.  Did ex's twice.    Current walking capacity: errands for 1-2 hours, walk for 20 min but hasn't started a regular program Used home stationary bike 1x (has triangle seat)  02/11/24 lumbar lami L4-5 decompression/microdiscectomy   PERTINENT HISTORY:  Parkinson's, seizures, cervical dystonia   PAIN:   Are you having pain? Yes NPRS scale: 2/10 left back no radiating pain lately Pain location: left low back radiating down to ankle; this last time across the back and down to left knee Pain orientation: Bilateral  PAIN TYPE: aching Pain description: intermittent  Aggravating factors: lifting; bending, rising sit to stand; difficulty vacuuming or making the bed Relieving factors: sitting with legs curled    PRECAUTIONS: post-surgical no bending, no twisting   WEIGHT BEARING RESTRICTIONS: No  FALLS:  Has patient fallen in last 6 months? 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   PATIENT SURVEYS:  Modified Oswestry:  MODIFIED OSWESTRY DISABILITY SCALE  Date: 04/12/24 Score  Pain intensity 0 = I can tolerate the pain I have without having to use pain medication.  2. Personal care (washing, dressing, etc.) 0 =  I can take care of myself normally without causing increased pain.  3. Lifting 3 = Pain prevents me from lifting heavy weights, but I can manage (5) I have hardly any social life because of my pain. light to medium weights if they are conveniently positioned  4. Walking 2 =  Pain prevents me from walking more than  mile.  5. Sitting 0 =  I can sit in any chair as long as I like.  6. Standing 2 =  Pain prevents me from standing more than 1 hour  7. Sleeping 0 = Pain does not prevent me from sleeping well.  8. Social Life 0 = My social life is normal and does not increase my pain.  9. Traveling 1 =  I can travel anywhere, but it increases my pain.  10. Employment/ Homemaking 2 = I can perform most of my homemaking/job duties, but pain prevents me from performing more physically stressful activities (eg, lifting, vacuuming).  Total 20%   Interpretation of scores: Score Category Description  0-20% Minimal Disability The patient can cope with most living activities. Usually no treatment is indicated apart from advice on lifting, sitting and exercise  21-40% Moderate Disability The patient experiences more pain and difficulty with sitting, lifting and standing. Travel and social life are more difficult and they may be disabled from work. Personal care, sexual activity and sleeping are not grossly affected, and the patient can usually be managed by conservative means  41-60% Severe Disability Pain remains the main problem in this group, but activities of daily living are affected. These  patients require a detailed investigation  61-80% Crippled Back pain impinges on all aspects of the patient's life. Positive intervention is required  81-100% Bed-bound  These patients are either bed-bound or exaggerating their  symptoms  Bluford BRAVO, [place] DELENA Karon DELENA, et al. Surgery versus conservative management of stable thoracolumbar fracture: the PRESTO feasibility RCT. [place]): [employer]; 2021 Nov. [facility] Technology Assessment, No. 25.62.) Appendix 3, Oswestry Disability Index category descriptors. Available from: Find[employer]el[employer].cz  Minimally Clinically Important Difference (MCID) = 12.8%  COGNITION: Overall cognitive status: Within functional limits for tasks assessed     POSTURE: increased thoracic kyphosis Small lumbar incision appears well healed   LUMBAR ROM: not formally tested secondary post surgical precautions  TRUNK STRENGTH:  Decreased activation of transverse abdominus muscles; abdominals 4-/5; decreased activation of lumbar multifidi; trunk extensors 4-/5; SLS 1-2 sec right/left with compensatory pelvic drop and trunk lean   LOWER EXTREMITY ROM:   able to do seated figure 4 position for putting on socks/shoes but stiffer on left vs. right  LOWER EXTREMITY MMT:  able to rise sit to stand without UE assist although knee valgus bil   MMT Right eval Left eval  Hip flexion 5 4+  Hip extension 5 4  Hip abduction 5 4  Hip adduction    Hip internal rotation    Hip external rotation 5 4  Knee flexion 5 4  Knee extension 5 4  Ankle dorsiflexion 5 4  Ankle plantarflexion 5 4  Ankle inversion    Ankle eversion     (Blank rows = not tested)  LUMBAR SPECIAL TESTS:  Sciatic Neural tension left > right in supine  FUNCTIONAL TESTS:  5x STS 24.30 Tug 15.87 6 MWT 802 feet Able to do a squat to retrieve small item from the floor (increased knee valgus)   GAIT: Comments: decreased arm swing; progressive trunk  flexion  TREATMENT DATE: 04/19/24 SKTC right/left 20 sec hold 2x Supine sciatic nerve floss 10x right/left  Supine transverse abdominus draw in 5 sec hold 5x; bent knee fall out with control 5x right/left; march 5x Lumbar rotation supine 7x right/left (Added to HEP- see below) Sidelying clams 7x right/left (Added to HEP- see below)  Sit to stand no hands 7x  Standing heel raises 10x  Standing hip abduction 10x single side support right/left  Standing hip extension 10x right/left Counter push ups 10x Green band rows 10x anchored on doorknob (Added to HEP- see below) pt states she has a green band at home  Green band anchored over the top of the door 10x (challenging) (Added to HEP- see below) NuStep L5 6 min with education on benefit of sustained activity for spinal muscle conditioning   TREATMENT DATE: 04/12/24 evaluation                                                                                                                                 PATIENT EDUCATION:  Education details: Educated patient on anatomy and physiology of current symptoms, prognosis, plan of care as well as initial self care strategies to promote recovery Person educated: Patient Education method: Explanation Education comprehension: verbalized understanding  HOME EXERCISE PROGRAM: Access Code: [email or web address] URL: [email or web address] Date: 04/19/2024 Prepared by: Glade Pesa  Exercises - Supine Single Knee to Chest Stretch  - 1 x daily - 7 x weekly - 1 sets - 5 reps - 20 hold - Supine Sciatic Nerve Glide  - 1 x daily - 7 x weekly - 1 sets - 10 reps - Sit to Stand  - 1 x daily - 7 x weekly - 1 sets - 10 reps - Standing Heel Raise with Support  - 1 x daily - 7 x weekly - 1 sets - 10 reps - Supine Transversus Abdominis Bracing - Hands on Stomach  - 1 x daily - 7 x weekly - 1 sets - 10 reps - 5 hold - Supine March  - 1 x daily - 7 x weekly - 1 sets - 7 reps - Supine Lower Trunk Rotation  - 1 x  daily - 7 x weekly - 1 sets - 10 reps - Clamshell  - 1 x daily - 7 x weekly - 1 sets - 7 reps - Push-Up on Counter  - 1 x daily - 7 x weekly - 1 sets - 10 reps - Standing Low Shoulder Row with Anchored Resistance  - 1 x daily - 7 x weekly - 1 sets - 10 reps - Shoulder extension with resistance - Neutral  - 1 x daily - 7 x weekly - 1 sets - 10 reps  ASSESSMENT:  CLINICAL IMPRESSION: Therapist providing verbal cues to optimize technique with  exercises in order to achieve the greatest benefit including activation of transverse abdominus core muscles and coordination of breathing with exhalation on exertion.Therapist progressing and updating HEP for in  challenge level for further strengthening and functional mobility.  No increase in pain during or at the end of session.   Eval: Patient is a 73 y.o. female who was seen today for physical therapy evaluation and treatment for s/p lumbar lami decompression, microdiscectomy and cyst removal on 02/11/24. She reports an improvement of left LE symptoms which previously radiated to her ankle, now pain stops at the knee.  She reports pain intensity is much improved however she did have a flare up when she bent to pick up a 10 pound bag of rice 2 weeks ago. She reports functional limitations with lifting, bending, rising sit to stand; difficulty vacuuming or making the bed.  She is unable to do her Parkinsons boxing class. The patient would benefit from PT to address strength asymmetries in lumbo/pelvic and hip regions and pain levels that are currently affecting activities of daily living.  OBJECTIVE IMPAIRMENTS: decreased activity tolerance, decreased mobility, difficulty walking, decreased strength, impaired perceived functional ability, postural dysfunction, and pain.   ACTIVITY LIMITATIONS: carrying, lifting, bending, standing, squatting, stairs, bed mobility, and locomotion level  PARTICIPATION LIMITATIONS: meal prep, cleaning, laundry, shopping, and  community activity  PERSONAL FACTORS: time since onset, Parkinson's are also affecting patient's functional outcome.   REHAB POTENTIAL: Good  CLINICAL DECISION MAKING: low/minimal complexity  EVALUATION COMPLEXITY: Low   GOALS: Goals reviewed with patient? Yes  SHORT TERM GOALS: Target date: 05/24/2024      The patient will demonstrate knowledge of basic self care strategies and exercises to promote healing  Baseline: Goal status: INITIAL  2.  The patient will have improved Timed Up and Go (TUG) time to  14     sec indicating improved gait speed and LE strength  Baseline:  Goal status: INITIAL  3.  Improved LE  strength for rising sit to stand with 5x STS time improved to 19 sec Baseline:  Goal status: INITIAL  4.  The patient will have improved gait stamina and speed needed to ambulate 900 feet in 6 minutes  Baseline:  Goal status: INITIAL  5.  The patient will report a 40% improvement in pain levels with functional activities which are currently difficult including bending, lifting, sit to stand Baseline:  Goal status: INITIAL   LONG TERM GOALS: Target date: 07/05/2024     The patient will be independent in a safe self progression of a home exercise program to promote further recovery of function   Baseline:  Goal status: INITIAL  2.  The patient will report a 70% improvement in pain levels with functional activities which are currently difficult including bending, lifting, sit to stand Baseline:  Goal status: INITIAL  3.  The patient will have improved trunk flexor and extensor muscle strength to at least 4+/5 needed for lifting medium weight objects such as grocery bags  Baseline:  Goal status: INITIAL  4.  The patient will have improved LE strength to at least 4+/5 needed for vacuuming and making the bed Baseline:  Goal status: INITIAL  5.  Modified Oswestry functional outcome measure score improved to   12  % indicating improved function with ADLS  with less pain.   Baseline:  Goal status: INITIAL   PLAN:  PT FREQUENCY: 1x/week (2x recommended however pt states she is unable to drive due to Parkinson's and her husband has numerous appts as well)  PT DURATION: 12 weeks  PLANNED INTERVENTIONS: [id number]- PT Re-evaluation, 97750- Physical Performance Testing, 97110-Therapeutic exercises, 97530- Therapeutic activity, 97112- Neuromuscular re-education, 97535- Self Care, [id number]- Manual therapy, [id number]- Aquatic Therapy, [id number]- Electrical stimulation (unattended), Patient/Family education, Taping, Spinal mobilization, Scar mobilization, Cryotherapy, and Moist heat.  PLAN FOR NEXT SESSION: HEP progression; encourage a walking program; lumbo/pelvic/hip core strengthening gradual progression; Nu-step for spinal muscle conditioning; pt states she can only schedule one appt at a time  Glade Pesa, PT 04/19/24 4:44 PM Phone: [phone] Fax: [phone]

## 2024-04-21 DIAGNOSIS — Z6823 Body mass index (BMI) 23.0-23.9, adult: Secondary | ICD-10-CM | POA: Diagnosis not present

## 2024-04-21 DIAGNOSIS — H9201 Otalgia, right ear: Secondary | ICD-10-CM | POA: Diagnosis not present

## 2024-04-21 DIAGNOSIS — H6123 Impacted cerumen, bilateral: Secondary | ICD-10-CM | POA: Diagnosis not present

## 2024-04-25 DIAGNOSIS — H61891 Other specified disorders of right external ear: Secondary | ICD-10-CM | POA: Diagnosis not present

## 2024-05-02 ENCOUNTER — Encounter (INDEPENDENT_AMBULATORY_CARE_PROVIDER_SITE_OTHER): Payer: Self-pay

## 2024-05-04 ENCOUNTER — Ambulatory Visit: Admitting: Physical Therapy

## 2024-05-04 DIAGNOSIS — M6281 Muscle weakness (generalized): Secondary | ICD-10-CM

## 2024-05-04 DIAGNOSIS — M5459 Other low back pain: Secondary | ICD-10-CM | POA: Diagnosis not present

## 2024-05-04 DIAGNOSIS — R262 Difficulty in walking, not elsewhere classified: Secondary | ICD-10-CM

## 2024-05-04 NOTE — Therapy (Signed)
 OUTPATIENT PHYSICAL THERAPY THORACOLUMBAR PROGRESS NOTE   Patient Name: Chelsea Conley MRN: [id number] DOB:Jun 27, 1951, 73 y.o., female Today's Date: 05/04/2024  END OF SESSION:  PT End of Session - 05/04/24 1535     Visit Number 3    Date for Recertification  07/05/24    Authorization Type [employer]    Progress Note Due on Visit 10    PT Start Time 1531    PT Stop Time 1610    PT Time Calculation (min) 39 min    Activity Tolerance Patient tolerated treatment well          Past Medical History:  Diagnosis Date   Abnormal levels of other serum enzymes 08/23/2023   Abnormal liver function 08/23/2023   Arthritis of carpometacarpal (CMC) joint of right thumb 12/21/2018   Cervical dystonia    Chronic idiopathic constipation    Dysphagia    Eustachian tube dysfunction, left 08/18/2021   History of colonic polyps    Hyperlipidemia 05/15/2015   Insomnia    Parkinson's disease 05/15/2015   PONV (postoperative nausea and vomiting)    REM sleep behaviors, mild    Seizures 2007   2007 last seizure   Synovial cyst    Tremor 05/15/2015   Past Surgical History:  Procedure Laterality Date   FOOT SURGERY Bilateral    HAND TENDON SURGERY Right    per patient   LUMBAR LAMINECTOMY/DECOMPRESSION MICRODISCECTOMY Left 02/11/2024   Procedure: Laminectomy for facet/synovial cyst - Lumbar Four-Lumbar Five - left;  Surgeon: Onetha Kuba, MD;  Location: [facility] OR;  Service: Neurosurgery;  Laterality: Left;  Laminectomy for facet/synovial cyst - L4-L5 - left   MOUTH SURGERY     X2   TONSILLECTOMY     Patient Active Problem List   Diagnosis Date Noted   Synovial cyst of lumbar facet joint 02/11/2024   Chronic idiopathic constipation    History of colonic polyps    Synovial cyst    Cervical dystonia    Dysphagia    REM sleep behaviors, mild    Insomnia    Eustachian tube dysfunction, left 08/18/2021   Arthritis of carpometacarpal [facility]) joint of right thumb 12/21/2018    Hyperlipidemia 05/15/2015   Tremor 05/15/2015   Parkinson's disease 05/15/2015   Seizures 2007    PCP: Nanci Senior MD  REFERRING PROVIDER: Onetha Kuba MD  REFERRING DIAG: M71.30 synovial cyst  Rationale for Evaluation and Treatment: Rehabilitation  THERAPY DIAG:  Other low back pain  Muscle weakness (generalized)  Difficulty in walking, not elsewhere classified  ONSET DATE: > 1 year  SUBJECTIVE:  SUBJECTIVE STATEMENT: Feeling tired today. Been up early this morning for my husband's appt. No pain. Tightness in back with cooking;  no radiating pain down leg just left low back once in a while.  It's the Parkinson's freezing that affects walking, if I someone to hold onto I could walk a mile.  Uses the cane when feeling shaky (using today.) Sees Dr. Onetha next week.   Current walking capacity: errands for 1-2 hours, walk for 20 min but hasn't started a regular program Used home stationary bike 1x (has triangle seat)  02/11/24 lumbar lami L4-5 decompression/microdiscectomy   PERTINENT HISTORY:  Parkinson's, seizures, cervical dystonia   PAIN:   Are you having pain? Yes NPRS scale: 0  Pain location: left low back radiating down to ankle; this last time across the back and down to left knee Pain orientation: Bilateral  PAIN TYPE: aching Pain description: intermittent  Aggravating factors: lifting; bending, rising sit to stand; difficulty vacuuming or making the bed Relieving factors: sitting with legs curled    PRECAUTIONS: post-surgical no bending, no twisting   WEIGHT BEARING RESTRICTIONS: No  FALLS:  Has patient fallen in last 6 months? 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   PATIENT SURVEYS:  Modified Oswestry:  MODIFIED OSWESTRY DISABILITY SCALE  Date: 04/12/24 Score  Pain intensity 0 = I can tolerate the pain I have without having to use pain medication.  2. Personal care (washing, dressing, etc.) 0 =  I can take care of myself normally without causing increased pain.  3. Lifting 3 = Pain prevents me from lifting heavy weights, but I can manage (5) I have hardly any social life because of my pain. light to medium weights if they are conveniently positioned  4. Walking 2 =  Pain prevents me from walking more than  mile.  5. Sitting 0 =  I can sit in any chair as long as I like.  6. Standing 2 =  Pain prevents me from standing more than 1 hour  7. Sleeping 0 = Pain does not prevent me from sleeping well.  8. Social Life 0 = My social life is normal and does not increase my pain.  9. Traveling 1 =  I can travel anywhere, but it increases my pain.  10. Employment/ Homemaking 2 = I can perform most of my homemaking/job duties, but pain prevents me from performing more physically stressful activities (eg, lifting, vacuuming).  Total 20%   Interpretation of scores: Score Category Description  0-20% Minimal Disability The patient can cope with most living activities. Usually no treatment is indicated apart from advice on lifting, sitting and exercise  21-40% Moderate Disability The patient experiences more pain and difficulty with sitting, lifting and standing. Travel and social life are more difficult and they may be disabled from work. Personal care, sexual activity and sleeping are not grossly affected, and  the patient can usually be managed by conservative means  41-60% Severe Disability Pain remains the main problem in this group, but activities of daily living are affected. These patients  require a detailed investigation  61-80% Crippled Back pain impinges on all aspects of the patient's life. Positive intervention is required  81-100% Bed-bound  These patients are either bed-bound or exaggerating their symptoms  Bluford FORBES Zoe DELENA Karon DELENA, et al. Surgery versus conservative management of stable thoracolumbar fracture: the PRESTO feasibility RCT. [place] ([place]): [employer]; 2021 Nov. [facility] Technology Assessment, No. 25.62.) Appendix 3, Oswestry Disability Index category descriptors. Available from: FindJewelers.cz  Minimally Clinically Important Difference (MCID) = 12.8%  COGNITION: Overall cognitive status: Within functional limits for tasks assessed     POSTURE: increased thoracic kyphosis Small lumbar incision appears well healed   LUMBAR ROM: not formally tested secondary post surgical precautions  TRUNK STRENGTH:  Decreased activation of transverse abdominus muscles; abdominals 4-/5; decreased activation of lumbar multifidi; trunk extensors 4-/5; SLS 1-2 sec right/left with compensatory pelvic drop and trunk lean   LOWER EXTREMITY ROM:   able to do seated figure 4 position for putting on socks/shoes but stiffer on left vs. right  LOWER EXTREMITY MMT:  able to rise sit to stand without UE assist although knee valgus bil   MMT Right eval Left eval  Hip flexion 5 4+  Hip extension 5 4  Hip abduction 5 4  Hip adduction    Hip internal rotation    Hip external rotation 5 4  Knee flexion 5 4  Knee extension 5 4  Ankle dorsiflexion 5 4  Ankle plantarflexion 5 4  Ankle inversion    Ankle eversion     (Blank rows = not tested)  LUMBAR SPECIAL TESTS:  Sciatic Neural tension left > right in supine  FUNCTIONAL TESTS:  5x STS  24.30 Tug 15.87 6 MWT 802 feet Able to do a squat to retrieve small item from the floor (increased knee valgus)   GAIT: Comments: decreased arm swing; progressive trunk flexion  TREATMENT DATE: 05/04/24 Supine sciatic nerve floss 10x right/left  Sidelying clams 10x right/left, added resistance red  Sit to stand no hands 5x;staggered 2 sets of 5 Standing heel raises 5x; offset 5x 2 sets Standing hip abduction  3# ankle weights 10x right/left pt has 3# at home Standing hip extension 3# 10x right/left Standing marching 3# on ankles Standing HS curls 3#  NuStep L5 10 min with further education on benefit of sustained activity for spinal muscle conditioning   TREATMENT DATE: 04/19/24 SKTC right/left 20 sec hold 2x Supine sciatic nerve floss 10x right/left  Supine transverse abdominus draw in 5 sec hold 5x; bent knee fall out with control 5x right/left; march 5x Lumbar rotation supine 7x right/left (Added to HEP- see below) Sidelying clams 7x right/left (Added to HEP- see below)  Sit to stand no hands 7x  Standing heel raises 10x  Standing hip abduction 10x single side support right/left  Standing hip extension 10x right/left Counter push ups 10x Green band rows 10x anchored on doorknob (Added to HEP- see below) pt states she has a green band at home  Green band anchored over the top of the door 10x (challenging) (Added to HEP- see below) NuStep L5 6 min with education on benefit of sustained activity for spinal muscle conditioning   TREATMENT DATE: 04/12/24 evaluation  PATIENT EDUCATION:  Education details: Educated patient on anatomy and physiology of current symptoms, prognosis, plan of care as well as initial self care strategies to promote recovery Person educated: Patient Education method: Explanation Education comprehension: verbalized  understanding  HOME EXERCISE PROGRAM: Access Code: [email or web address] URL: [email or web address] Date: 04/19/2024 Prepared by: Glade Pesa  Exercises - Supine Single Knee to Chest Stretch  - 1 x daily - 7 x weekly - 1 sets - 5 reps - 20 hold - Supine Sciatic Nerve Glide  - 1 x daily - 7 x weekly - 1 sets - 10 reps - Sit to Stand  - 1 x daily - 7 x weekly - 1 sets - 10 reps - Standing Heel Raise with Support  - 1 x daily - 7 x weekly - 1 sets - 10 reps - Supine Transversus Abdominis Bracing - Hands on Stomach  - 1 x daily - 7 x weekly - 1 sets - 10 reps - 5 hold - Supine March  - 1 x daily - 7 x weekly - 1 sets - 7 reps - Supine Lower Trunk Rotation  - 1 x daily - 7 x weekly - 1 sets - 10 reps - Clamshell  - 1 x daily - 7 x weekly - 1 sets - 7 reps - Push-Up on Counter  - 1 x daily - 7 x weekly - 1 sets - 10 reps - Standing Low Shoulder Row with Anchored Resistance  - 1 x daily - 7 x weekly - 1 sets - 10 reps - Shoulder extension with resistance - Neutral  - 1 x daily - 7 x weekly - 1 sets - 10 reps  ASSESSMENT:  CLINICAL IMPRESSION: Able to progress intensity of exercise with the addition of resistance (weights and bands to lower extremities). Therapist closely monitoring response throughout treatment session. She has no back pain or LE radiating pain today although she reports she is pretty fatigued at the end of session.  Therapist providing verbal cues to optimize technique with  exercises in order to achieve the greatest benefit.      Eval: Patient is a 73 y.o. female who was seen today for physical therapy evaluation and treatment for s/p lumbar lami decompression, microdiscectomy and cyst removal on 02/11/24. She reports an improvement of left LE symptoms which previously radiated to her ankle, now pain stops at the knee.  She reports pain intensity is much improved however she did have a flare up when she bent to pick up a 10 pound bag of rice 2 weeks ago. She reports  functional limitations with lifting, bending, rising sit to stand; difficulty vacuuming or making the bed.  She is unable to do her Parkinsons boxing class. The patient would benefit from PT to address strength asymmetries in lumbo/pelvic and hip regions and pain levels that are currently affecting activities of daily living.  OBJECTIVE IMPAIRMENTS: decreased activity tolerance, decreased mobility, difficulty walking, decreased strength, impaired perceived functional ability, postural dysfunction, and pain.   ACTIVITY LIMITATIONS: carrying, lifting, bending, standing, squatting, stairs, bed mobility, and locomotion level  PARTICIPATION LIMITATIONS: meal prep, cleaning, laundry, shopping, and community activity  PERSONAL FACTORS: time since onset, Parkinson's are also affecting patient's functional outcome.   REHAB POTENTIAL: Good  CLINICAL DECISION MAKING: low/minimal complexity  EVALUATION COMPLEXITY: Low   GOALS: Goals reviewed with patient? Yes  SHORT TERM GOALS: Target date: 05/24/2024      The patient will demonstrate knowledge of basic self care strategies and exercises to  promote healing  Baseline: Goal status: met 10/23 2.  The patient will have improved Timed Up and Go (TUG) time to  14     sec indicating improved gait speed and LE strength  Baseline:  Goal status: INITIAL  3.  Improved LE strength for rising sit to stand with 5x STS time improved to 19 sec Baseline:  Goal status: INITIAL  4.  The patient will have improved gait stamina and speed needed to ambulate 900 feet in 6 minutes  Baseline:  Goal status: INITIAL  5.  The patient will report a 40% improvement in pain levels with functional activities which are currently difficult including bending, lifting, sit to stand Baseline:  Goal status: INITIAL   LONG TERM GOALS: Target date: 07/05/2024     The patient will be independent in a safe self progression of a home exercise program to promote further  recovery of function   Baseline:  Goal status: INITIAL  2.  The patient will report a 70% improvement in pain levels with functional activities which are currently difficult including bending, lifting, sit to stand Baseline:  Goal status: INITIAL  3.  The patient will have improved trunk flexor and extensor muscle strength to at least 4+/5 needed for lifting medium weight objects such as grocery bags  Baseline:  Goal status: INITIAL  4.  The patient will have improved LE strength to at least 4+/5 needed for vacuuming and making the bed Baseline:  Goal status: INITIAL  5.  Modified Oswestry functional outcome measure score improved to   12  % indicating improved function with ADLS with less pain.   Baseline:  Goal status: INITIAL   PLAN:  PT FREQUENCY: 1x/week (2x recommended however pt states she is unable to drive due to Parkinson's and her husband has numerous appts as well)  PT DURATION: 12 weeks  PLANNED INTERVENTIONS: [id number]- PT Re-evaluation, 97750- Physical Performance Testing, 97110-Therapeutic exercises, 97530- Therapeutic activity, 97112- Neuromuscular re-education, 97535- Self Care, [id number]- Manual therapy, [id number]- Aquatic Therapy, [id number]- Electrical stimulation (unattended), Patient/Family education, Taping, Spinal mobilization, Scar mobilization, Cryotherapy, and Moist heat.  PLAN FOR NEXT SESSION: see how appt went with Dr. Onetha; check progress with STGs; HEP progression; encourage a walking program; lumbo/pelvic/hip core strengthening gradual progression; Nu-step for spinal muscle conditioning; pt states she can only schedule one appt at a time  Glade Pesa, PT 05/04/24 4:17 PM Phone: [phone] Fax: [phone]

## 2024-06-19 NOTE — Progress Notes (Unsigned)
 Assessment/Plan:    Parkinsons Disease, sx's since at least 2015 - Discussed possibly increasing medication, but she actually looks quite well today and she is happy.  Continue Rytary  245 mg, 1 po 8am/noon/4pm/bedtime.  She can take extra prn -increase exercise and she and I talked about this in detail. - Daughter asked about DBS surgery and we talked about fact that she would likely be a candidate now given motor fluctuations, but her symptoms are quite mild.  I am happy to explore this with them if they would like but we all agreed that she is not ready.   2.  Parkinson's dyskinesia             -Patient to continue amantadine , 100 mg 3 times per day.  She does have livedo reticularis (very mild) and understands that this is a consequence of the amantadine .   3.  RBD             -Overall mild and no additional medication required.   4.  Cervical dystonia             -Patient not interested in Botox.  5  dysphagia  -Modified barium evaluation done on August 13, 2021.  There was evidence of mild oropharyngeal dysphagia.  Regular diet with thin liquids recommended.  6.  Memory change  - She saw Dr. Richie for neurocognitive testing in February, 2025.  This was normal.  7.  Weight loss/insomnia  -she is off of the mirtazapine .  She thought it made the dreaming worse.  8.  Low back pain with lumbar radiculopathy and synovial cyst  -Has seen Dr. Vernetta and Dr. Onetha  - She has status post lumbar laminectomy L4-L5 and resection of left synovial cyst August, 2025 with Dr. Onetha Subjective:   Chelsea Conley was seen today in follow up for Parkinsons disease.  My previous records were reviewed prior to todays visit as well as outside records available to me.  Pt with daughter who supplements hx. patient is taking her Rytary  faithfully.  She is on amantadine  with the first 3 dosages of Rytary .  She has had no hallucinations.  Last visit, we talked about increasing exercise.  She did have  back surgery with Dr. Onetha on August 1  Current prescribed movement disorder medications: Rytary  245 mg, 1 at 8 AM/noon/4 PM/bedtime  Amantadine , 100 mg 3 times per day  Prior medications: Carbidopa /levodopa  IR (nausea/vomiting); selegiline  (discontinued because of cost); carbidopa /levodopa  CR (still with some nausea); mirtazapine  (she thought it caused more dreams)  ALLERGIES:  No Known Allergies  CURRENT MEDICATIONS:  No outpatient medications have been marked as taking for the 06/21/24 encounter (Appointment) with Mayes Sangiovanni, Asberry RAMAN, DO.     Objective:   PHYSICAL EXAMINATION:    VITALS:   There were no vitals filed for this visit.     Wt Readings from Last 3 Encounters:  02/11/24 135 lb (61.2 kg)  02/08/24 135 lb 6.4 oz (61.4 kg)  12/01/23 131 lb 9.6 oz (59.7 kg)    GEN:  The patient appears stated age and is in NAD. HEENT:  Normocephalic, atraumatic.  The mucous membranes are moist. The superficial temporal arteries are without ropiness or tenderness. CV:  RRR Lungs:  CTAB Neck/HEME:  There are no carotid bruits bilaterally.  Neurological examination:  Orientation: The patient is alert and oriented x3. Cranial nerves: There is good facial symmetry with mild facial hypomimia. The speech is fluent and clear. Soft palate rises symmetrically and  there is no tongue deviation. Hearing is intact to conversational tone. Sensation: Sensation is intact to light touch throughout Motor: Strength is at least antigravity x4.   Movement examination: Tone: There is mild increased tone in the bilateral UE Abnormal movements: There is left upper extremity rest tremor, intermittent and mild.  There is mild dyskinesia. Coordination:  There is no significant decremation today. Gait and Station: She rises out of the chair easily.  She walks well in the hall.  She has very minimal hesitation in the turn.  She has good armswing.  I have reviewed and interpreted the following labs  independently    Chemistry      Component Value Date/Time   NA 137 02/08/2024 1400   NA 143 11/21/2019 1046   K 4.1 02/08/2024 1400   CL 104 02/08/2024 1400   CO2 25 02/08/2024 1400   BUN 19 02/08/2024 1400   BUN 15 11/21/2019 1046   CREATININE 0.90 02/08/2024 1400      Component Value Date/Time   CALCIUM 8.6 (L) 02/08/2024 1400   ALKPHOS 84 02/08/2024 1400   AST 19 02/08/2024 1400   ALT 6 02/08/2024 1400   BILITOT 0.7 02/08/2024 1400   BILITOT 0.4 11/21/2019 1046       Lab Results  Component Value Date   WBC 5.6 02/08/2024   HGB 12.7 02/08/2024   HCT 39.0 02/08/2024   MCV 91.5 02/08/2024   PLT 239 02/08/2024    No results found for: TSH   Total time spent on today's visit was *** minutes, including both face-to-face time and nonface-to-face time.  Time included that spent on review of records (prior notes available to me/labs/imaging if pertinent), discussing treatment and goals, answering patient's questions and coordinating care.  Cc:  Nanci Senior, MD

## 2024-06-21 ENCOUNTER — Ambulatory Visit: Admitting: Neurology

## 2024-06-21 ENCOUNTER — Other Ambulatory Visit: Payer: Self-pay | Admitting: Neurology

## 2024-06-21 DIAGNOSIS — G20B1 Parkinson's disease with dyskinesia, without mention of fluctuations: Secondary | ICD-10-CM

## 2024-06-21 DIAGNOSIS — G20B2 Parkinson's disease with dyskinesia, with fluctuations: Secondary | ICD-10-CM | POA: Diagnosis not present

## 2024-06-21 MED ORDER — CARBIDOPA-LEVODOPA ER 61.25-245 MG PO CPCR: ORAL_CAPSULE | ORAL | 1 refills | Status: DC

## 2024-06-21 NOTE — Patient Instructions (Signed)
°  VISIT SUMMARY: Today, we discussed your ongoing issues with Parkinson's disease, particularly your difficulty walking and freezing episodes. We also addressed your fatigue and provided recommendations to help manage these symptoms.  YOUR PLAN: -PARKINSON'S DISEASE WITH DYSKINESIA AND FREEZING OF GAIT: Parkinson's disease is a disorder of the nervous system that affects movement. Dyskinesia refers to involuntary movements, and freezing of gait is when you temporarily feel as if your feet are glued to the floor. We have increased your Rytary  dosage to 245 mg twice at 8 AM and noon, while maintaining 245 mg at 4 PM and bedtime. Continue taking amantadine  100 mg three times daily. We also discussed the potential future use of a carbidopa -levodopa  pump and encouraged you to use your walker to help manage freezing episodes.  -FATIGUE DUE TO PARKINSON'S DISEASE: Fatigue is a common symptom of Parkinson's disease, characterized by extreme tiredness and lack of energy. We recommend regular exercise, possibly with the help of a personal trainer for accountability, and advised you to monitor your blood pressure regularly.  INSTRUCTIONS: Please follow the updated medication schedule for Rytary  and continue taking amantadine  as prescribed. Use your walker to help manage freezing episodes. Engage in regular exercise and consider working with a systems analyst. Monitor your blood pressure regularly. If you experience any new symptoms or have concerns, please schedule a follow-up appointment. Tell your daughter that I said that a personal trainer would be a GREAT christmas present :) Also tell your daughter that I hope she recovers well following surgery.                    Contains text generated by Abridge.                                 Contains text generated by Abridge.

## 2024-06-23 ENCOUNTER — Telehealth: Payer: Self-pay | Admitting: Neurology

## 2024-06-23 NOTE — Telephone Encounter (Signed)
 [facility] Call ID: [id number]  Caller: Damien Patricelli: Daughter  PH: [phone]  Reason: Vomiting: Pt started an increased dose of Rytary  today, presents with vomiting (denies Blood) onset this afternoon, and has voided within the last 8 hours. No other symptoms.  --Caller reports vomiting about a dozen times. Caller also reports having a headache.

## 2024-06-23 NOTE — Telephone Encounter (Signed)
 I called pt number myself.  There was no answer but I did tell her to drop back to prior dosage over the weekend and she is to call me Monday to let me know how she did.

## 2024-06-26 ENCOUNTER — Telehealth: Payer: Self-pay | Admitting: Neurology

## 2024-06-26 NOTE — Telephone Encounter (Signed)
 Pt called in this morning and she stated that she took the prescription called Rytary  and pt stated that the prescription really worked for her.  Pt also stated that her insurance denied the prescription for Rytary  due to needing more information . Thanks

## 2024-06-26 NOTE — Telephone Encounter (Signed)
 Filled out appeal paperwork and put in Dr. Martie office

## 2024-06-27 ENCOUNTER — Other Ambulatory Visit: Payer: Self-pay

## 2024-06-27 DIAGNOSIS — G20B1 Parkinson's disease with dyskinesia, without mention of fluctuations: Secondary | ICD-10-CM

## 2024-06-27 MED ORDER — CARBIDOPA-LEVODOPA ER 61.25-245 MG PO CPCR: ORAL_CAPSULE | ORAL | Status: DC

## 2024-06-27 NOTE — Telephone Encounter (Signed)
 Pt called in this morning again and she stated that she took the prescription called Rytary  and pt stated that the prescription really worked for her.  Pt also stated that her insurance denied the prescription for Rytary  due to needing more information . Thanks

## 2024-06-27 NOTE — Telephone Encounter (Signed)
 Called patient  she is feeling much better taking the  Rytary  at the way she was in the past 1 @ 8am/noon/4pm/bedtime

## 2024-07-04 NOTE — Telephone Encounter (Signed)
 Patient calling in her medication Rytary  is going to run out in 2.5 weeks. Patient was denied coverage by her insurance and we did an appeal have not heard back

## 2024-07-04 NOTE — Telephone Encounter (Signed)
 Pt is wanting to talk with chelsea again about a few things. Would like a call back

## 2024-07-05 NOTE — Telephone Encounter (Signed)
 I received new  information

## 2024-07-10 NOTE — Telephone Encounter (Signed)
 Pt calling again now stating pills will only last til Thursday and no update has been provided on how what her next steps will be, pls contact pt back with update

## 2024-07-11 ENCOUNTER — Other Ambulatory Visit: Payer: Self-pay

## 2024-07-11 ENCOUNTER — Telehealth: Payer: Self-pay | Admitting: Neurology

## 2024-07-11 DIAGNOSIS — G20B1 Parkinson's disease with dyskinesia, without mention of fluctuations: Secondary | ICD-10-CM

## 2024-07-11 MED ORDER — CARBIDOPA-LEVODOPA ER 61.25-245 MG PO CPCR: ORAL_CAPSULE | ORAL | 1 refills | Status: AC

## 2024-07-11 NOTE — Telephone Encounter (Signed)
 Pt called in and she wants to know the status on the prescription called Rytary , due to the insurance denied . Pt wants to know what is the next step, because she is running out. Thanks

## 2024-07-11 NOTE — Telephone Encounter (Signed)
 Called [employer] and the rep I spoke to said PA is approved for lowest level tier which will be 80$ for a 3 month supply

## 2024-07-17 ENCOUNTER — Encounter (INDEPENDENT_AMBULATORY_CARE_PROVIDER_SITE_OTHER): Payer: Self-pay

## 2024-12-28 ENCOUNTER — Ambulatory Visit: Admitting: Neurology
